# Patient Record
Sex: Male | Born: 1960 | Race: White | Hispanic: No | Marital: Married | Smoking: Former smoker
Health system: Southern US, Community
[De-identification: ages and names within clinical notes are randomized; demographics above are authoritative.]

## PROBLEM LIST (undated history)

## (undated) DIAGNOSIS — R112 Nausea with vomiting, unspecified: Secondary | ICD-10-CM

## (undated) DIAGNOSIS — G5600 Carpal tunnel syndrome, unspecified upper limb: Secondary | ICD-10-CM

## (undated) DIAGNOSIS — Z9889 Other specified postprocedural states: Secondary | ICD-10-CM

## (undated) DIAGNOSIS — G1221 Amyotrophic lateral sclerosis: Secondary | ICD-10-CM

## (undated) DIAGNOSIS — I10 Essential (primary) hypertension: Secondary | ICD-10-CM

## (undated) DIAGNOSIS — F419 Anxiety disorder, unspecified: Secondary | ICD-10-CM

## (undated) DIAGNOSIS — K219 Gastro-esophageal reflux disease without esophagitis: Secondary | ICD-10-CM

## (undated) DIAGNOSIS — E538 Deficiency of other specified B group vitamins: Secondary | ICD-10-CM

## (undated) HISTORY — DX: Essential (primary) hypertension: I10

## (undated) HISTORY — PX: HAND SURGERY: SHX662

## (undated) HISTORY — PX: COLONOSCOPY: SHX174

## (undated) HISTORY — PX: HERNIA REPAIR: SHX51

## (undated) HISTORY — PX: KNEE ARTHROSCOPY: SUR90

---

## 1993-01-16 HISTORY — PX: BACK SURGERY: SHX140

## 2013-07-31 ENCOUNTER — Ambulatory Visit (INDEPENDENT_AMBULATORY_CARE_PROVIDER_SITE_OTHER): Payer: BC Managed Care – PPO

## 2013-07-31 ENCOUNTER — Other Ambulatory Visit: Payer: Self-pay | Admitting: *Deleted

## 2013-07-31 ENCOUNTER — Ambulatory Visit (INDEPENDENT_AMBULATORY_CARE_PROVIDER_SITE_OTHER): Payer: BC Managed Care – PPO | Admitting: Podiatry

## 2013-07-31 ENCOUNTER — Encounter: Payer: Self-pay | Admitting: Podiatry

## 2013-07-31 VITALS — BP 141/91 | HR 69 | Resp 16 | Ht 73.0 in | Wt 280.0 lb

## 2013-07-31 DIAGNOSIS — M722 Plantar fascial fibromatosis: Secondary | ICD-10-CM

## 2013-07-31 DIAGNOSIS — L608 Other nail disorders: Secondary | ICD-10-CM

## 2013-07-31 MED ORDER — METHYLPREDNISOLONE (PAK) 4 MG PO TABS
ORAL_TABLET | ORAL | Status: DC
Start: 2013-07-31 — End: 2013-09-01

## 2013-07-31 MED ORDER — MELOXICAM 15 MG PO TABS: 15.0000 mg | ORAL_TABLET | Freq: Every day | ORAL | Status: DC

## 2013-07-31 NOTE — Patient Instructions (Signed)
Plantar Fasciitis  Plantar fasciitis is a common condition that causes foot pain. It is soreness (inflammation) of the band of tough fibrous tissue on the bottom of the foot that runs from the heel bone (calcaneus) to the ball of the foot. The cause of this soreness may be from excessive standing, poor fitting shoes, running on hard surfaces, being overweight, having an abnormal walk, or overuse (this is common in runners) of the painful foot or feet. It is also common in aerobic exercise dancers and ballet dancers.  SYMPTOMS   Most people with plantar fasciitis complain of:   Severe pain in the morning on the bottom of their foot especially when taking the first steps out of bed. This pain recedes after a few minutes of walking.   Severe pain is experienced also during walking following a long period of inactivity.   Pain is worse when walking barefoot or up stairs  DIAGNOSIS    Your caregiver will diagnose this condition by examining and feeling your foot.   Special tests such as X-rays of your foot, are usually not needed.  PREVENTION    Consult a sports medicine professional before beginning a new exercise program.   Walking programs offer a good workout. With walking there is a lower chance of overuse injuries common to runners. There is less impact and less jarring of the joints.   Begin all new exercise programs slowly. If problems or pain develop, decrease the amount of time or distance until you are at a comfortable level.   Wear good shoes and replace them regularly.   Stretch your foot and the heel cords at the back of the ankle (Achilles tendon) both before and after exercise.   Run or exercise on even surfaces that are not hard. For example, asphalt is better than pavement.   Do not run barefoot on hard surfaces.   If using a treadmill, vary the incline.   Do not continue to workout if you have foot or joint problems. Seek professional help if they do not improve.  HOME CARE INSTRUCTIONS     Avoid activities that cause you pain until you recover.   Use ice or cold packs on the problem or painful areas after working out.   Only take over-the-counter or prescription medicines for pain, discomfort, or fever as directed by your caregiver.   Soft shoe inserts or athletic shoes with air or gel sole cushions may be helpful.   If problems continue or become more severe, consult a sports medicine caregiver or your own health care provider. Cortisone is a potent anti-inflammatory medication that may be injected into the painful area. You can discuss this treatment with your caregiver.  MAKE SURE YOU:    Understand these instructions.   Will watch your condition.   Will get help right away if you are not doing well or get worse.  Document Released: 09/27/2000 Document Revised: 03/27/2011 Document Reviewed: 11/27/2007  ExitCare Patient Information 2015 [employer]. This information is not intended to replace advice given to you by your health care provider. Make sure you discuss any questions you have with your health care provider.

## 2013-07-31 NOTE — Progress Notes (Signed)
   Subjective:    Patient ID: Steve Koch, male    DOB: Sep 23, 1960, 53 y.o.   MRN: [id number]  HPI Comments: Left foot pain , its been on and off for about a year now. Its in the back of my achilles and along the side of the foot and top.  At times it feels like pins in the top of my foot and the back feels like a pull  Foot Pain      Review of Systems  All other systems reviewed and are negative.      Objective:   Physical Exam: I have reviewed his past medical history medications allergies surgeries social history and review of systems. Pulses are strongly palpable bilateral. Neurologic sensorium is intact per Semmes-Weinstein monofilament. Deep tendon reflexes are intact and brisk bilateral. Muscle strength is 5 over 5 dorsiflexors plantar flexors inverters everters all intrinsic musculature appears to be intact. Orthopedic evaluation demonstrates pain on palpation medial continued tubercle of the left heel. Minimal tenderness on palpation of the tendo Achilles. Cutaneous evaluation demonstrates supple well hydrated cutis with exception of the hallux nail plate left which does demonstrate a nail dystrophy with some mild tinea pedis surrounding it. Radiographic evaluation does demonstrate soft tissue increase in density at the plantar fascial calcaneal insertion site of the left heel.        Assessment & Plan:  Assessment: Nail dystrophy hallux left. Plantar fasciitis left foot.  Plan: Discussed etiology pathology conservative versus surgical therapies. Samples of the nail and skin were taken today for culture. We will notify him once this returns. We injected his left heel today with Kenalog and local anesthetic. Dispensed a plantar fascial brace as well as a night splint. He was given both oral and written home-going instructions for the care of his foot we also provided him with prescription for him meloxicam and a Medrol Dosepak. We discussed appropriate shoe gear stretching  exercises ice therapy and shoe gear modifications. I will followup with him in one month

## 2013-08-18 ENCOUNTER — Encounter: Payer: Self-pay | Admitting: Podiatry

## 2013-08-18 ENCOUNTER — Telehealth: Payer: Self-pay | Admitting: *Deleted

## 2013-08-18 NOTE — Telephone Encounter (Signed)
Spoke to patient regarding nail results that was negative for fungus . Discussed nuvail and the possible toenail removal, pt stated that he will discuss that with dr Al Corpushyatt on his next visit

## 2013-09-01 ENCOUNTER — Ambulatory Visit (INDEPENDENT_AMBULATORY_CARE_PROVIDER_SITE_OTHER): Payer: BC Managed Care – PPO | Admitting: Podiatry

## 2013-09-01 ENCOUNTER — Encounter: Payer: Self-pay | Admitting: Podiatry

## 2013-09-01 VITALS — BP 144/85 | HR 69 | Resp 16

## 2013-09-01 DIAGNOSIS — M722 Plantar fascial fibromatosis: Secondary | ICD-10-CM

## 2013-09-01 NOTE — Progress Notes (Signed)
He presents today for followup of plantar fasciitis. States that he is 85-90% resolved left foot.  Objective: Vital signs are stable he is alert and oriented x3. He has no pain on palpation medial continued tubercle of the left heel.  Assessment: Plantar fasciitis resolving.  Plan: I encouraged him to continue the use of his tennis shoes an ultra shoe gear that we discussed last visit. We'll also continue all conservative therapies including night splint today splint icing and anti-inflammatories I will followup with him should he start to regress.

## 2013-09-29 ENCOUNTER — Ambulatory Visit: Payer: BC Managed Care – PPO | Admitting: Podiatry

## 2016-02-28 ENCOUNTER — Ambulatory Visit: Payer: Self-pay | Admitting: Physician Assistant

## 2016-03-02 DIAGNOSIS — F419 Anxiety disorder, unspecified: Secondary | ICD-10-CM | POA: Diagnosis present

## 2016-03-02 DIAGNOSIS — Z79899 Other long term (current) drug therapy: Secondary | ICD-10-CM | POA: Insufficient documentation

## 2016-03-02 DIAGNOSIS — I10 Essential (primary) hypertension: Secondary | ICD-10-CM | POA: Insufficient documentation

## 2016-03-03 ENCOUNTER — Emergency Department (HOSPITAL_COMMUNITY)
Admission: EM | Admit: 2016-03-03 | Discharge: 2016-03-03 | Disposition: A | Discharge status: Home / Self Care | Payer: BLUE CROSS/BLUE SHIELD | Attending: Emergency Medicine | Admitting: Emergency Medicine

## 2016-03-03 ENCOUNTER — Encounter (HOSPITAL_COMMUNITY): Payer: Self-pay

## 2016-03-03 DIAGNOSIS — F410 Panic disorder [episodic paroxysmal anxiety] without agoraphobia: Secondary | ICD-10-CM

## 2016-03-03 MED ORDER — LORAZEPAM 1 MG PO TABS: 1.0000 mg | ORAL_TABLET | Freq: Three times a day (TID) | ORAL | 0 refills | Status: DC | PRN

## 2016-03-03 NOTE — ED Notes (Signed)
Pt states he may have had an anxiety attack tonight due to stress. Pt is going to have surgery next week and has been dealing with workman's comp issues. Pt states he has not been sleeping well due to being uncomfortable. He also states the doctor's are trying to rule out ALS.

## 2016-03-03 NOTE — Discharge Instructions (Signed)
Take the ativan if needed for anxiety. Keep your appointment for your surgery next week.  Recheck if you get any worsening of your weakness, numbness or you seem worse.

## 2016-03-03 NOTE — ED Provider Notes (Signed)
[facility]ENCY DEPT Provider Note   CSN: [id number] Arrival date & time: 03/02/16  2357  Time seen 04:58 AM   History   Chief Complaint Chief Complaint  Patient presents with  . Anxiety    HPI Steve Koch is a 56 y.o. male.  HPI  patient reports he had a fall in October and started having weakness in his right hand over the next 6-8 weeks. He was recently seen by Dr. Shon BatonBrooks, orthopedists at [facility] and was diagnosed with a  cervical 5 myelopathy. Patient denies having any pain in his neck or his extremities. However he has persistent weakness in his right hand. He states initially they thought he might have ALS and that was ruled out. He states he had some difficulty today with his Workmen's Comp. contact and he is worried about the surgery that is going to happen in 6 days. He states he's been having trouble sleeping. Tonight he started getting sweaty and shaky and felt tight all over. He felt like if he threw up he would feel better. When he checked his blood pressure was high at 171/111. He states tonight he took his medications at 9 PM and for the first time he took a Robaxin which he is not sure if that made him feel bad. Wife states the episode lasted about an hour and a half. After he got to the ED and had resolved however it started to start again. Patient feels like he's having a panic attack. Pt is right handed.  PCP Kirk RuthsMCGOUGH,WILLIAM M, MD   Past Medical History:  Diagnosis Date  . Hypertension     There are no active problems to display for this patient.   Past Surgical History:  Procedure Laterality Date  . BACK SURGERY    . HAND SURGERY    . HERNIA REPAIR         Home Medications    Prior to Admission medications   Medication Sig Start Date End Date Taking? Authorizing Provider  amLODipine (NORVASC) 2.5 MG tablet Take 2.5 mg by mouth daily.  02/05/16  Yes Historical Provider, MD  Flaxseed, Linseed, (FLAX SEED OIL) 1000 MG CAPS Take 1,000 mg  by mouth daily.   Yes Historical Provider, MD  methocarbamol (ROBAXIN) 500 MG tablet take 1 tablet by mouth three times a day if needed for muscle spasms 02/11/16  Yes Historical Provider, MD  omeprazole (PRILOSEC) 20 MG capsule Take 20 mg by mouth daily.   Yes Historical Provider, MD  LORazepam (ATIVAN) 1 MG tablet Take 1 tablet (1 mg total) by mouth 3 (three) times daily as needed for anxiety. 03/03/16   Devoria AlbeIva Jake Fuhrmann, MD  meloxicam (MOBIC) 15 MG tablet Take 1 tablet (15 mg total) by mouth daily. Patient not taking: Reported on 03/01/2016 07/31/13   Max T Al CorpusHyatt, DPM    Family History No family history on file.  Social History Social History  Substance Use Topics  . Smoking status: Never Smoker  . Smokeless tobacco: Never Used  . Alcohol use No  employed   Allergies   Patient has no known allergies.   Review of Systems Review of Systems  All other systems reviewed and are negative.    Physical Exam Updated Vital Signs BP 153/94 (BP Location: Left Arm)   Pulse 69   Temp 99.3 F (37.4 C) (Oral)   Resp 16   Ht 6\' 1"  (1.854 m)   Wt 256 lb (116.1 kg)   SpO2 100%   BMI 33.78  kg/m   Vital signs normal    Physical Exam  Constitutional: He appears well-developed and well-nourished. No distress.  HENT:  Head: Normocephalic and atraumatic.  Right Ear: External ear normal.  Left Ear: External ear normal.  Mouth/Throat: Oropharynx is clear and moist.  Eyes: Conjunctivae and EOM are normal. Pupils are equal, round, and reactive to light.  Neck: Normal range of motion. Neck supple.  Cardiovascular: Normal rate, regular rhythm, normal heart sounds and intact distal pulses.   Pulmonary/Chest: Effort normal and breath sounds normal. No respiratory distress.  Musculoskeletal:  Patient is unable to grip my fingers at all with his right hand. He has normal strength on the left. He states this is his baseline. Patient is unable to dorsiflex his right wrist. He is able to have volar  flexion. He appears to have normal range of motion and strength at the level the elbow and the shoulder.     ED Treatments / Results   Procedures Procedures (including critical care time)  Medications Ordered in ED Medications - No data to display   Initial Impression / Assessment and Plan / ED Course  I have reviewed the triage vital signs and the nursing notes.  Pertinent labs & imaging results that were available during my care of the patient were reviewed by me and considered in my medical decision making (see chart for details).  Patient was feeling better at the time of my exam. He states everything is back to his baseline. He was given some alprazolam to use for anxiety. Patient was advised to return if he gets any worsening symptoms.  Final Clinical Impressions(s) / ED Diagnoses   Final diagnoses:  Anxiety attack    New Prescriptions New Prescriptions   LORAZEPAM (ATIVAN) 1 MG TABLET    Take 1 tablet (1 mg total) by mouth 3 (three) times daily as needed for anxiety.    Plan discharge  Devoria Albe, MD, Concha Pyo, MD 03/03/16 [phone]

## 2016-03-03 NOTE — ED Triage Notes (Signed)
I have a bulging disc in my neck, and I am scheduled for surgery next week.  I took my medications before I laid down tonight and I woke up feeling weird all over, could not get comfortable, and I was feeling really stiff.  Patient denies pain.  The doctor's told him if he fell he would probably paralyze him.  Patient states that has been on his mind and could be the problem.  States that they checked his blood pressure at home and it was really high.

## 2016-03-03 NOTE — ED Notes (Signed)
Pt alert & oriented x4, stable gait. Patient given discharge instructions, paperwork & prescription(s). Registration completed in room.  Patient verbalized understanding. Pt left department w/ no further questions. 

## 2016-03-06 ENCOUNTER — Other Ambulatory Visit (HOSPITAL_COMMUNITY): Payer: Self-pay

## 2016-03-06 ENCOUNTER — Other Ambulatory Visit (HOSPITAL_COMMUNITY): Payer: Self-pay | Admitting: *Deleted

## 2016-03-06 NOTE — Pre-Procedure Instructions (Signed)
Andres ShadCharles Vandevelde  03/06/2016    Your procedure is scheduled on Thursday, March 09, 2016 at 2:05 PM.   Report to [facility] Entrance "A" Admitting Office at [employer] Noon.   Call this number if you have problems the morning of surgery: [phone]   Any questions prior to day of surgery, please call [phone] between 8 & 4 PM.   Remember:  Do not eat food or drink liquids after midnight Wednesday, 03/08/16.  Take these medicines the morning of surgery with A SIP OF WATER: Amlodipine (Norvasc), Omeprazole (Prilosec), Lorazepam (Ativan) - if needed  Stop Flaxseed as of today.   Do not wear jewelry.  Do not wear lotions, powders or cologne.  Men may shave face and neck.  Do not bring valuables to the hospital.  [facility] any belongings or valuables.  Contacts, dentures or bridgework may not be worn into surgery.  Leave your suitcase in the car.  After surgery it may be brought to your room.  For patients admitted to the hospital, discharge time will be determined by your treatment team.  Patients discharged the day of surgery will not be allowed to drive home.   Special instructions:  [facility] - Preparing for Surgery  Before surgery, you can play an important role.  Because skin is not sterile, your skin needs to be as free of germs as possible.  You can reduce the number of germs on you skin by washing with CHG (chlorahexidine gluconate) soap before surgery.  CHG is an antiseptic cleaner which kills germs and bonds with the skin to continue killing germs even after washing.  Please DO NOT use if you have an allergy to CHG or antibacterial soaps.  If your skin becomes reddened/irritated stop using the CHG and inform your nurse when you arrive at Short Stay.  Do not shave (including legs and underarms) for at least 48 hours prior to the first CHG shower.  You may shave your face.  Please follow these instructions carefully:   1.  Shower with  CHG Soap the night before surgery and the                    morning of Surgery.  2.  If you choose to wash your hair, wash your hair first as usual with your       normal shampoo.  3.  After you shampoo, rinse your hair and body thoroughly to remove the shampoo.  4.  Use CHG as you would any other liquid soap.  You can apply chg directly       to the skin and wash gently with scrungie or a clean washcloth.  5.  Apply the CHG Soap to your body ONLY FROM THE NECK DOWN.        Do not use on open wounds or open sores.  Avoid contact with your eyes, ears, mouth and genitals (private parts).  Wash genitals (private parts) with your normal soap.  6.  Wash thoroughly, paying special attention to the area where your surgery        will be performed.  7.  Thoroughly rinse your body with warm water from the neck down.  8.  DO NOT shower/wash with your normal soap after using and rinsing off       the CHG Soap.  9.  Pat yourself dry with a clean towel.  10.  Wear clean pajamas.            11.  Place clean sheets on your bed the night of your first shower and do not        sleep with pets.  Day of Surgery  Do not apply any lotions the morning of surgery.  Please wear clean clothes to the hospital.   Please read over the fact sheets that you were given.

## 2016-03-07 ENCOUNTER — Encounter (HOSPITAL_COMMUNITY): Payer: Self-pay

## 2016-03-07 ENCOUNTER — Encounter (HOSPITAL_COMMUNITY)
Admission: RE | Admit: 2016-03-07 | Discharge: 2016-03-07 | Disposition: A | Discharge status: Home / Self Care | Payer: Worker's Compensation | Source: Ambulatory Visit | Attending: Orthopedic Surgery | Admitting: Orthopedic Surgery

## 2016-03-07 DIAGNOSIS — Z01812 Encounter for preprocedural laboratory examination: Secondary | ICD-10-CM | POA: Diagnosis present

## 2016-03-07 DIAGNOSIS — M50222 Other cervical disc displacement at C5-C6 level: Secondary | ICD-10-CM | POA: Diagnosis not present

## 2016-03-07 HISTORY — DX: Other specified postprocedural states: Z98.890

## 2016-03-07 HISTORY — DX: Other specified postprocedural states: R11.2

## 2016-03-07 HISTORY — DX: Gastro-esophageal reflux disease without esophagitis: K21.9

## 2016-03-07 HISTORY — DX: Carpal tunnel syndrome, unspecified upper limb: G56.00

## 2016-03-07 HISTORY — DX: Anxiety disorder, unspecified: F41.9

## 2016-03-07 LAB — SURGICAL PCR SCREEN
MRSA, PCR: NEGATIVE
Staphylococcus aureus: POSITIVE — AB

## 2016-03-07 LAB — BASIC METABOLIC PANEL
Anion gap: 9 (ref 5–15)
BUN: 16 mg/dL (ref 6–20)
CHLORIDE: 107 mmol/L (ref 101–111)
CO2: 24 mmol/L (ref 22–32)
Calcium: 9.5 mg/dL (ref 8.9–10.3)
Creatinine, Ser: 1.07 mg/dL (ref 0.61–1.24)
GFR calc non Af Amer: >60 mL/min (ref >60)
Glucose, Bld: 118 mg/dL — ABNORMAL HIGH (ref 65–99)
POTASSIUM: 4.5 mmol/L (ref 3.5–5.1)
SODIUM: 140 mmol/L (ref 135–145)

## 2016-03-07 LAB — CBC
HEMATOCRIT: 47.3 % (ref 39.0–52.0)
Hemoglobin: 17.1 g/dL — ABNORMAL HIGH (ref 13.0–17.0)
MCH: 30.8 pg (ref 26.0–34.0)
MCHC: 36.2 g/dL — ABNORMAL HIGH (ref 30.0–36.0)
MCV: 85.2 fL (ref 78.0–100.0)
Platelets: 260 10*3/uL (ref 150–400)
RBC: 5.55 MIL/uL (ref 4.22–5.81)
RDW: 13.9 % (ref 11.5–15.5)
WBC: 9.7 10*3/uL (ref 4.0–10.5)

## 2016-03-07 NOTE — Progress Notes (Signed)
   03/07/16 0823  OBSTRUCTIVE SLEEP APNEA  Have you ever been diagnosed with sleep apnea through a sleep study? No  Do you snore loudly (loud enough to be heard through closed doors)?  1  Do you often feel tired, fatigued, or sleepy during the daytime (such as falling asleep during driving or talking to someone)? 1  Has anyone observed you stop breathing during your sleep? 0  Do you have, or are you being treated for high blood pressure? 1  BMI more than 35 kg/m2? 0  Age > 50 (1-yes) 1  Neck circumference greater than:Male 16 inches or larger, Male 17inches or larger? 1  Male Gender (Yes=1) 1  Obstructive Sleep Apnea Score 6  Score 5 or greater  Results sent to PCP

## 2016-03-07 NOTE — Progress Notes (Signed)
Pt denies cardiac history, chest pain or sob. Pt states he had an E[facility] done at [facility] in [place] this month. Have requested that EKG be faxed to us.

## 2016-03-09 ENCOUNTER — Encounter (HOSPITAL_COMMUNITY)
Admission: RE | Disposition: A | Discharge status: Home / Self Care | Payer: Self-pay | Source: Ambulatory Visit | Attending: Orthopedic Surgery

## 2016-03-09 ENCOUNTER — Encounter (HOSPITAL_COMMUNITY): Payer: Self-pay | Admitting: Certified Registered Nurse Anesthetist

## 2016-03-09 ENCOUNTER — Observation Stay (HOSPITAL_COMMUNITY): Payer: Worker's Compensation

## 2016-03-09 ENCOUNTER — Ambulatory Visit (HOSPITAL_COMMUNITY): Payer: Worker's Compensation

## 2016-03-09 ENCOUNTER — Ambulatory Visit (HOSPITAL_COMMUNITY): Payer: Worker's Compensation | Admitting: Certified Registered Nurse Anesthetist

## 2016-03-09 ENCOUNTER — Observation Stay (HOSPITAL_COMMUNITY)
Admission: RE | Admit: 2016-03-09 | Discharge: 2016-03-10 | Disposition: A | Discharge status: Home / Self Care | Payer: Worker's Compensation | Source: Ambulatory Visit | Attending: Orthopedic Surgery | Admitting: Orthopedic Surgery

## 2016-03-09 DIAGNOSIS — F419 Anxiety disorder, unspecified: Secondary | ICD-10-CM | POA: Insufficient documentation

## 2016-03-09 DIAGNOSIS — K219 Gastro-esophageal reflux disease without esophagitis: Secondary | ICD-10-CM | POA: Diagnosis not present

## 2016-03-09 DIAGNOSIS — Z79899 Other long term (current) drug therapy: Secondary | ICD-10-CM | POA: Diagnosis not present

## 2016-03-09 DIAGNOSIS — M4712 Other spondylosis with myelopathy, cervical region: Secondary | ICD-10-CM | POA: Diagnosis not present

## 2016-03-09 DIAGNOSIS — G959 Disease of spinal cord, unspecified: Secondary | ICD-10-CM | POA: Diagnosis present

## 2016-03-09 DIAGNOSIS — I10 Essential (primary) hypertension: Secondary | ICD-10-CM | POA: Diagnosis not present

## 2016-03-09 DIAGNOSIS — Z87891 Personal history of nicotine dependence: Secondary | ICD-10-CM | POA: Diagnosis not present

## 2016-03-09 DIAGNOSIS — Z419 Encounter for procedure for purposes other than remedying health state, unspecified: Secondary | ICD-10-CM

## 2016-03-09 DIAGNOSIS — M50022 Cervical disc disorder at C5-C6 level with myelopathy: Principal | ICD-10-CM | POA: Insufficient documentation

## 2016-03-09 DIAGNOSIS — Z981 Arthrodesis status: Secondary | ICD-10-CM

## 2016-03-09 HISTORY — PX: NECK SURGERY: SHX720

## 2016-03-09 HISTORY — PX: ANTERIOR CERVICAL DECOMP/DISCECTOMY FUSION: SHX1161

## 2016-03-09 SURGERY — ANTERIOR CERVICAL DECOMPRESSION/DISCECTOMY FUSION 2 LEVELS
Anesthesia: General | Site: Spine Cervical

## 2016-03-09 MED ORDER — KETAMINE HCL 10 MG/ML IJ SOLN
INTRAMUSCULAR | Status: DC | PRN
Start: 2016-03-09 — End: 2016-03-09
  Administered 2016-03-09 (×2): 20 mg via INTRAVENOUS

## 2016-03-09 MED ORDER — PROPOFOL 10 MG/ML IV BOLUS
INTRAVENOUS | Status: AC
  Filled 2016-03-09: qty 20

## 2016-03-09 MED ORDER — MORPHINE SULFATE (PF) 2 MG/ML IV SOLN: 2.0000 mg | INTRAVENOUS | Status: DC | PRN

## 2016-03-09 MED ORDER — SCOPOLAMINE 1 MG/3DAYS TD PT72
1.0000 | MEDICATED_PATCH | TRANSDERMAL | Status: DC
  Administered 2016-03-09: 1.5 mg via TRANSDERMAL
  Filled 2016-03-09: qty 1

## 2016-03-09 MED ORDER — METHOCARBAMOL 500 MG PO TABS: 500.0000 mg | ORAL_TABLET | Freq: Three times a day (TID) | ORAL | 0 refills | Status: DC | PRN

## 2016-03-09 MED ORDER — THROMBIN 20000 UNITS EX SOLR
CUTANEOUS | Status: DC | PRN
  Administered 2016-03-09: 20 mL via TOPICAL

## 2016-03-09 MED ORDER — SODIUM CHLORIDE 0.9 % IV SOLN
0.0500 ug/kg/min | INTRAVENOUS | Status: AC
  Administered 2016-03-09: 0.1 ug/kg/min via INTRAVENOUS
  Filled 2016-03-09: qty 5000

## 2016-03-09 MED ORDER — 0.9 % SODIUM CHLORIDE (POUR BTL) OPTIME
TOPICAL | Status: DC | PRN
  Administered 2016-03-09: 1000 mL

## 2016-03-09 MED ORDER — METHOCARBAMOL 500 MG PO TABS: 500.0000 mg | ORAL_TABLET | Freq: Four times a day (QID) | ORAL | Status: DC | PRN

## 2016-03-09 MED ORDER — ONDANSETRON HCL 4 MG/2ML IJ SOLN
INTRAMUSCULAR | Status: AC
  Filled 2016-03-09: qty 2

## 2016-03-09 MED ORDER — OXYCODONE-ACETAMINOPHEN 10-325 MG PO TABS: 1.0000 | ORAL_TABLET | ORAL | 0 refills | Status: DC | PRN

## 2016-03-09 MED ORDER — ACETAMINOPHEN 325 MG PO TABS: 650.0000 mg | ORAL_TABLET | Freq: Four times a day (QID) | ORAL | Status: DC | PRN

## 2016-03-09 MED ORDER — SODIUM CHLORIDE 0.9% FLUSH: 3.0000 mL | Freq: Two times a day (BID) | INTRAVENOUS | Status: DC

## 2016-03-09 MED ORDER — ACETAMINOPHEN 650 MG RE SUPP: 650.0000 mg | RECTAL | Status: DC | PRN

## 2016-03-09 MED ORDER — PHENYLEPHRINE HCL 10 MG/ML IJ SOLN
INTRAVENOUS | Status: DC | PRN
  Administered 2016-03-09: 50 ug/min via INTRAVENOUS

## 2016-03-09 MED ORDER — FENTANYL CITRATE (PF) 100 MCG/2ML IJ SOLN
INTRAMUSCULAR | Status: DC | PRN
  Administered 2016-03-09 (×2): 100 ug via INTRAVENOUS

## 2016-03-09 MED ORDER — ONDANSETRON HCL 4 MG PO TABS: 4.0000 mg | ORAL_TABLET | Freq: Three times a day (TID) | ORAL | 0 refills | Status: DC | PRN

## 2016-03-09 MED ORDER — POLYETHYLENE GLYCOL 3350 17 G PO PACK: 17.0000 g | PACK | Freq: Every day | ORAL | Status: DC | PRN

## 2016-03-09 MED ORDER — THROMBIN 20000 UNITS EX SOLR
CUTANEOUS | Status: AC
  Filled 2016-03-09: qty 20000

## 2016-03-09 MED ORDER — PROPOFOL 500 MG/50ML IV EMUL
INTRAVENOUS | Status: DC | PRN
  Administered 2016-03-09: 15:00:00 via INTRAVENOUS
  Administered 2016-03-09: 50 ug/kg/min via INTRAVENOUS
  Administered 2016-03-09: 13:00:00 via INTRAVENOUS

## 2016-03-09 MED ORDER — MIDAZOLAM HCL 2 MG/2ML IJ SOLN
INTRAMUSCULAR | Status: DC | PRN
  Administered 2016-03-09 (×2): 1 mg via INTRAVENOUS

## 2016-03-09 MED ORDER — ESMOLOL HCL 100 MG/10ML IV SOLN
INTRAVENOUS | Status: DC | PRN
  Administered 2016-03-09: 30 mg via INTRAVENOUS

## 2016-03-09 MED ORDER — LACTATED RINGERS IV SOLN
INTRAVENOUS | Status: DC
  Administered 2016-03-09 (×2): via INTRAVENOUS

## 2016-03-09 MED ORDER — LIDOCAINE 2% (20 MG/ML) 5 ML SYRINGE
INTRAMUSCULAR | Status: AC
  Filled 2016-03-09: qty 5

## 2016-03-09 MED ORDER — ONDANSETRON HCL 4 MG/2ML IJ SOLN: 4.0000 mg | INTRAMUSCULAR | Status: DC | PRN

## 2016-03-09 MED ORDER — ACETAMINOPHEN 325 MG PO TABS: 650.0000 mg | ORAL_TABLET | ORAL | Status: DC | PRN

## 2016-03-09 MED ORDER — OXYCODONE HCL 5 MG PO TABS
10.0000 mg | ORAL_TABLET | ORAL | Status: DC | PRN
  Administered 2016-03-10: 10 mg via ORAL
  Filled 2016-03-09: qty 2

## 2016-03-09 MED ORDER — DEXAMETHASONE SODIUM PHOSPHATE 4 MG/ML IJ SOLN
4.0000 mg | Freq: Four times a day (QID) | INTRAMUSCULAR | Status: DC
  Administered 2016-03-09 – 2016-03-10 (×2): 4 mg via INTRAVENOUS
  Filled 2016-03-09 (×2): qty 1

## 2016-03-09 MED ORDER — FENTANYL CITRATE (PF) 100 MCG/2ML IJ SOLN
INTRAMUSCULAR | Status: AC
  Filled 2016-03-09: qty 2

## 2016-03-09 MED ORDER — PHENOL 1.4 % MT LIQD: 1.0000 | OROMUCOSAL | Status: DC | PRN

## 2016-03-09 MED ORDER — EPHEDRINE SULFATE 50 MG/ML IJ SOLN
INTRAMUSCULAR | Status: DC | PRN
  Administered 2016-03-09 (×2): 10 mg via INTRAVENOUS

## 2016-03-09 MED ORDER — SUCCINYLCHOLINE CHLORIDE 20 MG/ML IJ SOLN
INTRAMUSCULAR | Status: DC | PRN
  Administered 2016-03-09: 100 mg via INTRAVENOUS
  Administered 2016-03-09: 120 mg via INTRAVENOUS

## 2016-03-09 MED ORDER — MENTHOL 3 MG MT LOZG: 1.0000 | LOZENGE | OROMUCOSAL | Status: DC | PRN

## 2016-03-09 MED ORDER — SODIUM CHLORIDE 0.9% FLUSH: 3.0000 mL | INTRAVENOUS | Status: DC | PRN

## 2016-03-09 MED ORDER — DEXAMETHASONE SODIUM PHOSPHATE 10 MG/ML IJ SOLN
INTRAMUSCULAR | Status: AC
  Filled 2016-03-09: qty 1

## 2016-03-09 MED ORDER — ROCURONIUM BROMIDE 50 MG/5ML IV SOSY
PREFILLED_SYRINGE | INTRAVENOUS | Status: AC
  Filled 2016-03-09: qty 5

## 2016-03-09 MED ORDER — GLYCOPYRROLATE 0.2 MG/ML IJ SOLN
INTRAMUSCULAR | Status: DC | PRN
  Administered 2016-03-09 (×3): 0.2 mg via INTRAVENOUS

## 2016-03-09 MED ORDER — LACTATED RINGERS IV SOLN
INTRAVENOUS | Status: DC | PRN
  Administered 2016-03-09 (×2): via INTRAVENOUS

## 2016-03-09 MED ORDER — PROPOFOL 1000 MG/100ML IV EMUL
INTRAVENOUS | Status: AC
  Filled 2016-03-09: qty 100

## 2016-03-09 MED ORDER — LACTATED RINGERS IV SOLN
INTRAVENOUS | Status: DC
  Administered 2016-03-09: 19:00:00 via INTRAVENOUS

## 2016-03-09 MED ORDER — AMLODIPINE BESYLATE 2.5 MG PO TABS
2.5000 mg | ORAL_TABLET | Freq: Every day | ORAL | Status: DC
  Administered 2016-03-10: 2.5 mg via ORAL
  Filled 2016-03-09: qty 1

## 2016-03-09 MED ORDER — BUPIVACAINE-EPINEPHRINE (PF) 0.5% -1:200000 IJ SOLN
INTRAMUSCULAR | Status: AC
  Filled 2016-03-09: qty 30

## 2016-03-09 MED ORDER — ACETAMINOPHEN 650 MG RE SUPP: 650.0000 mg | Freq: Four times a day (QID) | RECTAL | Status: DC | PRN

## 2016-03-09 MED ORDER — CEFAZOLIN SODIUM-DEXTROSE 2-4 GM/100ML-% IV SOLN
2.0000 g | INTRAVENOUS | Status: AC
  Administered 2016-03-09 (×2): 2 g via INTRAVENOUS
  Filled 2016-03-09: qty 100

## 2016-03-09 MED ORDER — PROPOFOL 1000 MG/100ML IV EMUL
INTRAVENOUS | Status: AC
  Filled 2016-03-09: qty 300

## 2016-03-09 MED ORDER — ACETAMINOPHEN 10 MG/ML IV SOLN
INTRAVENOUS | Status: DC | PRN
  Administered 2016-03-09: 1000 mg via INTRAVENOUS

## 2016-03-09 MED ORDER — PROMETHAZINE HCL 25 MG/ML IJ SOLN: 6.2500 mg | INTRAMUSCULAR | Status: DC | PRN

## 2016-03-09 MED ORDER — PANTOPRAZOLE SODIUM 40 MG PO TBEC
40.0000 mg | DELAYED_RELEASE_TABLET | Freq: Every day | ORAL | Status: DC
  Administered 2016-03-10: 40 mg via ORAL
  Filled 2016-03-09: qty 1

## 2016-03-09 MED ORDER — CEFAZOLIN SODIUM-DEXTROSE 2-4 GM/100ML-% IV SOLN
2.0000 g | Freq: Three times a day (TID) | INTRAVENOUS | Status: AC
  Administered 2016-03-10 (×2): 2 g via INTRAVENOUS
  Filled 2016-03-09 (×2): qty 100

## 2016-03-09 MED ORDER — ARTIFICIAL TEARS OP OINT
TOPICAL_OINTMENT | OPHTHALMIC | Status: DC | PRN
  Administered 2016-03-09: 1 via OPHTHALMIC

## 2016-03-09 MED ORDER — PROPOFOL 10 MG/ML IV BOLUS
INTRAVENOUS | Status: DC | PRN
  Administered 2016-03-09: 50 mg via INTRAVENOUS
  Administered 2016-03-09: 200 mg via INTRAVENOUS

## 2016-03-09 MED ORDER — ACETAMINOPHEN 10 MG/ML IV SOLN
1000.0000 mg | Freq: Four times a day (QID) | INTRAVENOUS | Status: DC
  Administered 2016-03-09 – 2016-03-10 (×3): 1000 mg via INTRAVENOUS
  Filled 2016-03-09 (×2): qty 100

## 2016-03-09 MED ORDER — EPHEDRINE 5 MG/ML INJ
INTRAVENOUS | Status: AC
  Filled 2016-03-09: qty 10

## 2016-03-09 MED ORDER — BUPIVACAINE-EPINEPHRINE 0.5% -1:200000 IJ SOLN
INTRAMUSCULAR | Status: DC | PRN
  Administered 2016-03-09: 5 mL

## 2016-03-09 MED ORDER — ARTIFICIAL TEARS OP OINT
TOPICAL_OINTMENT | OPHTHALMIC | Status: AC
  Filled 2016-03-09: qty 3.5

## 2016-03-09 MED ORDER — HYDROMORPHONE HCL 1 MG/ML IJ SOLN: 0.2500 mg | INTRAMUSCULAR | Status: DC | PRN

## 2016-03-09 MED ORDER — DEXAMETHASONE SODIUM PHOSPHATE 10 MG/ML IJ SOLN
INTRAMUSCULAR | Status: DC | PRN
  Administered 2016-03-09: 10 mg via INTRAVENOUS

## 2016-03-09 MED ORDER — DEXAMETHASONE 4 MG PO TABS
4.0000 mg | ORAL_TABLET | Freq: Four times a day (QID) | ORAL | Status: DC
  Administered 2016-03-10: 4 mg via ORAL
  Filled 2016-03-09: qty 1

## 2016-03-09 MED ORDER — LIDOCAINE HCL (CARDIAC) 20 MG/ML IV SOLN
INTRAVENOUS | Status: DC | PRN
  Administered 2016-03-09: 60 mg via INTRATRACHEAL

## 2016-03-09 MED ORDER — ONDANSETRON HCL 4 MG/2ML IJ SOLN
INTRAMUSCULAR | Status: DC | PRN
  Administered 2016-03-09: 4 mg via INTRAVENOUS

## 2016-03-09 MED ORDER — MEPERIDINE HCL 25 MG/ML IJ SOLN: 6.2500 mg | INTRAMUSCULAR | Status: DC | PRN

## 2016-03-09 MED ORDER — METHOCARBAMOL 1000 MG/10ML IJ SOLN
500.0000 mg | Freq: Four times a day (QID) | INTRAMUSCULAR | Status: DC | PRN
  Filled 2016-03-09: qty 5

## 2016-03-09 MED ORDER — SODIUM CHLORIDE 0.9 % IV SOLN: 250.0000 mL | INTRAVENOUS | Status: DC

## 2016-03-09 MED ORDER — MIDAZOLAM HCL 2 MG/2ML IJ SOLN
INTRAMUSCULAR | Status: AC
  Filled 2016-03-09: qty 2

## 2016-03-09 MED ORDER — KETAMINE HCL-SODIUM CHLORIDE 100-0.9 MG/10ML-% IV SOSY
PREFILLED_SYRINGE | INTRAVENOUS | Status: AC
  Filled 2016-03-09: qty 10

## 2016-03-09 SURGICAL SUPPLY — 77 items
BIT DRILL SKYLINE 12MM (BIT) IMPLANT
BLADE SURG ROTATE 9660 (MISCELLANEOUS) IMPLANT
BONE VIVIGEN FORMABLE 1.3CC (Bone Implant) ×3 IMPLANT
BUR EGG ELITE 4.0 (BURR) IMPLANT
BUR EGG ELITE 4.0MM (BURR)
BUR MATCHSTICK NEURO 3.0 LAGG (BURR) IMPLANT
CAGE CERV NANOLOCK LG 7 6D (Spacer) ×2 IMPLANT
CAGE CERV NANOLOCK LG 7MM 6D (Spacer) ×2 IMPLANT
CANISTER SUCTION 2500CC (MISCELLANEOUS) ×3 IMPLANT
CLOSURE STERI-STRIP 1/2X4 (GAUZE/BANDAGES/DRESSINGS) ×1
CLSR STERI-STRIP ANTIMIC 1/2X4 (GAUZE/BANDAGES/DRESSINGS) ×2 IMPLANT
CORDS BIPOLAR (ELECTRODE) ×3 IMPLANT
COVER SURGICAL LIGHT HANDLE (MISCELLANEOUS) ×8 IMPLANT
CRADLE DONUT ADULT HEAD (MISCELLANEOUS) ×3 IMPLANT
DRAIN TLS ROUND 10FR (DRAIN) ×2 IMPLANT
DRAPE C-ARM 42X72 X-RAY (DRAPES) ×3 IMPLANT
DRAPE POUCH INSTRU U-SHP 10X18 (DRAPES) ×3 IMPLANT
DRAPE SURG 17X23 STRL (DRAPES) ×3 IMPLANT
DRAPE U-SHAPE 47X51 STRL (DRAPES) ×3 IMPLANT
DRILL BIT SKYLINE 12MM (BIT) ×3
DRSG MEPILEX BORDER 4X4 (GAUZE/BANDAGES/DRESSINGS) ×3 IMPLANT
DURAPREP 6ML APPLICATOR 50/CS (WOUND CARE) ×3 IMPLANT
ELECT COATED BLADE 2.86 ST (ELECTRODE) ×3 IMPLANT
ELECT PENCIL ROCKER SW 15FT (MISCELLANEOUS) ×3 IMPLANT
ELECT REM PT RETURN 9FT ADLT (ELECTROSURGICAL) ×3
ELECTRODE REM PT RTRN 9FT ADLT (ELECTROSURGICAL) ×1 IMPLANT
FEE INTRAOP MONITOR IMPULS NCS (MISCELLANEOUS) IMPLANT
GLOVE BIO SURGEON STRL SZ 6.5 (GLOVE) ×2 IMPLANT
GLOVE BIO SURGEONS STRL SZ 6.5 (GLOVE) ×1
GLOVE BIOGEL PI IND STRL 6.5 (GLOVE) ×1 IMPLANT
GLOVE BIOGEL PI IND STRL 8.5 (GLOVE) ×1 IMPLANT
GLOVE BIOGEL PI INDICATOR 6.5 (GLOVE) ×2
GLOVE BIOGEL PI INDICATOR 8.5 (GLOVE) ×2
GLOVE SS BIOGEL STRL SZ 8.5 (GLOVE) ×1 IMPLANT
GLOVE SUPERSENSE BIOGEL SZ 8.5 (GLOVE) ×2
GOWN STRL REUS W/ TWL XL LVL3 (GOWN DISPOSABLE) ×2 IMPLANT
GOWN STRL REUS W/TWL 2XL LVL3 (GOWN DISPOSABLE) ×6 IMPLANT
GOWN STRL REUS W/TWL XL LVL3 (GOWN DISPOSABLE) ×6
GRAFT BNE MATRIX VG FRMBL SM 1 (Bone Implant) IMPLANT
INTRAOP MONITOR FEE IMPULS NCS (MISCELLANEOUS) ×1
INTRAOP MONITOR FEE IMPULSE (MISCELLANEOUS) ×2
KIT BASIN OR (CUSTOM PROCEDURE TRAY) ×3 IMPLANT
KIT ROOM TURNOVER OR (KITS) ×3 IMPLANT
NDL SPNL 18GX3.5 QUINCKE PK (NEEDLE) ×1 IMPLANT
NEEDLE SPNL 18GX3.5 QUINCKE PK (NEEDLE) ×3 IMPLANT
NS IRRIG 1000ML POUR BTL (IV SOLUTION) ×3 IMPLANT
PACK ORTHO CERVICAL (CUSTOM PROCEDURE TRAY) ×3 IMPLANT
PACK UNIVERSAL I (CUSTOM PROCEDURE TRAY) ×3 IMPLANT
PAD ARMBOARD 7.5X6 YLW CONV (MISCELLANEOUS) ×6 IMPLANT
PATTIES SURGICAL .25X.25 (GAUZE/BANDAGES/DRESSINGS) ×3 IMPLANT
PIN DISTRACTION 14 (PIN) ×2 IMPLANT
PIN TEMP SKYLINE THREADED (PIN) ×2 IMPLANT
PLATE SKYLINE TWO LEVEL 32MM (Plate) ×2 IMPLANT
PUTTY DBX 1CC (Putty) ×3 IMPLANT
PUTTY DBX 1CC DEPUY (Putty) IMPLANT
RESTRAINT LIMB HOLDER UNIV (RESTRAINTS) ×3 IMPLANT
SCREW SELF DRILL SKYLINE 12MM (Screw) ×4 IMPLANT
SCREW SKYLINE 14MM SD-VA (Screw) ×8 IMPLANT
SLEEVE SURGEON STRL (DRAPES) ×2 IMPLANT
SPONGE INTESTINAL PEANUT (DISPOSABLE) ×3 IMPLANT
SPONGE LAP 4X18 X RAY DECT (DISPOSABLE) ×6 IMPLANT
SPONGE SURGIFOAM ABS GEL 100 (HEMOSTASIS) ×3 IMPLANT
SURGIFLO W/THROMBIN 8M KIT (HEMOSTASIS) IMPLANT
SUT BONE WAX W31G (SUTURE) ×3 IMPLANT
SUT MON AB 3-0 SH 27 (SUTURE) ×3
SUT MON AB 3-0 SH27 (SUTURE) ×1 IMPLANT
SUT SILK 2 0 (SUTURE)
SUT SILK 2-0 18XBRD TIE 12 (SUTURE) IMPLANT
SUT VIC AB 2-0 CT1 18 (SUTURE) ×3 IMPLANT
SYR BULB IRRIGATION 50ML (SYRINGE) ×3 IMPLANT
SYR CONTROL 10ML LL (SYRINGE) ×3 IMPLANT
TAPE CLOTH 4X10 WHT NS (GAUZE/BANDAGES/DRESSINGS) ×3 IMPLANT
TAPE UMBILICAL COTTON 1/8X30 (MISCELLANEOUS) ×3 IMPLANT
TOWEL OR 17X24 6PK STRL BLUE (TOWEL DISPOSABLE) ×3 IMPLANT
TOWEL OR 17X26 10 PK STRL BLUE (TOWEL DISPOSABLE) ×3 IMPLANT
TRAY FOLEY W/METER SILVER 14FR (SET/KITS/TRAYS/PACK) ×2 IMPLANT
WATER STERILE IRR 1000ML POUR (IV SOLUTION) ×3 IMPLANT

## 2016-03-09 NOTE — Op Note (Deleted)
  The note originally documented on this encounter has been moved the the encounter in which it belongs.  

## 2016-03-09 NOTE — Op Note (Signed)
NAME:  Steve Koch, Steve Koch NO.:  [id number]  MEDICAL RECORD NO.:  [id number]  LOCATION:  APA18                        FACILITY:  [facility]  PHYSICIAN:  Tahani Potier D. Shon Baton, M.D. 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  OPERATIVE PROCEDURE:  Anterior cervical diskectomy and fusion, C5-6, C6- 7.  IMPLANT SYSTEM USED:  The nanoLOCK size 7 large intervertebral cage packed with ViviGen with a 32 mm anterior cervical diffuse Skyline plate affixed with 14 mm screws and 16 mm screws.  COMPLICATIONS:  None.  Neuromonitoring performed throughout the case with improvement in motor and sensory evoked potentials at the conclusion of the case.  FIRST ASSISTANT:  Anette Riedel, [place].  HISTORY:  This is a very pleasant 56 year old gentleman, who has had progressive loss in strength and function in the right upper extremity. He has also had an ataxic gait and difficulty with balance and walking. A preoperative MRI demonstrated a very large central and right paracentral disk herniation at C5-6 and a central disk herniation at C6- 7.  The patient had signs and symptoms of significant myelopathy due to this very large disk herniation at C5-6.  After discussing treatment options, we elected to proceed with surgery due to the progressive neurological deficits.  All appropriate risks, benefits, and alternatives were discussed with the patient, and consent was obtained.  OPERATIVE NOTE:  The patient was brought to the operating room and placed supine on the operating table.  After successful induction of general anesthesia and endotracheal intubation, Ted's, SCDs, and a Foley were  inserted and the neuromonitoring rep applied all appropriate needles and pads for intraoperative SSEP and evoked motor potential monitoring.  The anterior cervical spine was prepped and draped in a standard fashion.  Time-out was taken confirming patient, procedure, and all other pertinent important data.  Once this was done, a midline incision was made starting at the midline proceeding to the left, centered over the C6 vertebral body.  Sharp dissection was carried out down to the deep fascia.  I incised the platysma, then began sharply dissecting along the medial border of the sternocleidomastoid performing a standard Smith-Robinson approach to the anterior cervical spine.  The omohyoid was identified and sacrificed for better visualization.  I continued dissecting sharply into the deep cervical and prevertebral fascia until I could palpate the anterior longitudinal ligament.  A self- retaining retractor was placed to mobilize the esophagus and trachea to the right and protected the carotid sheath with the finger on the left. I then used Kittner dissectors to remove the remaining prevertebral and fascia and exposed the entire anterior longitudinal ligament.  I placed the needle into the C5-6 disk space and took a lateral x-ray confirming I was at the appropriate level.  Once this was done, I used bipolar electrocautery to mobilize the longus colli muscle from midbody of C5 to the midbody of C7 bilaterally.  I then removed the large exostoses from the  anterior aspects of the C5-6 and C6-7 disk spaces.  A Caspar retractor was placed underneath the longus colli muscle.  The endotracheal cuff was deflated.  I expanded the retractors to the appropriate width and then reinflated the cuff.  An annulotomy was performed with a 15-blade scalpel at C5-6, and I used pituitary rongeur to remove the bulk of the disk material.  Two 1 mm Kerrison punches were used to remove the overhanging osteophyte  from the inferior aspect of the C5 vertebral body.  Distraction pins were placed into the bodies of 5 and 6.  I distracted the intervertebral space and maintained it with the distraction pins.  I continued to work posteriorly removing the rest of the disk down to the posterior annulus using micro nerve curettes.  I then used my nerve hook to deliver from the central and posterolateral to the right area multiple large fragments of disk material.  I then used my 1 mm Kerrison punch to remove the posterior osteophytes from the bodies of 5 and 6.  I then developed a plane using a micro nerve hook underneath the PLL and then resected the PLL.  This was done starting on the left side and proceeding to the right.  At this point, I was able to develop a plane underneath the still large disk fragment that was remaining.  With a great deal of time, I was able to mobilize this fragment and ultimately remove it in its entirety.  At this point, I felt as though I had a complete diskectomy removal.  I was able to get my nerve hook well behind the bodies of C5 and C6 and swept circumferentially.  I was also able to get underneath the uncovertebral joint and sweep out the nerve toward the nerve root.  This was a very significant defect that was left behind secondary to this large fragment.  At this point, I was pleased with the diskectomy.  I did feel as though based on the amount of disk material I pulled out that it was consistent with the very large disk herniation that was seen on the preoperative MRI.  At this point, I rasped the endplates, measured and placed a size 7 large Titan nanoLOCK cage packed with ViviGen.  At this point, I repositioned my distraction pins from the body of C5 to the body of C7 and then replaced and repositioned my retractors again.  An annulotomy was performed and using the same technique I had used at C5-6, I performed a complete diskectomy.  Again, a nerve hook was  used to develop a plane underneath the PLL and this was resected with a 1 mm Kerrison rongeur and the central disk herniation was excised.  At this point, again the endplates were rasped and the same size implant was used.  Distraction pins were then removed.  All bleeding holes were sealed with bone wax and the wound was irrigated.  A 32 mm anterior cervical plate was affixed using 16 mm self-drilling screws into the body of C5 and 14 mm screws into the bodies of 6 and 7.  All screws had excellent purchase.  The screws were then torqued off, were locked in place according to manufacturer's standards.  I did place a deep drain, which was brought out of a separate stab incision.  At upon the closure, there was no active bleeding.  I did obtain hemostasis.  The platysma was closed with interrupted 2-0 Vicryl suture and the skin was closed with 3-0  Monocryl.  Steri-Strips and dry dressings were applied and the patient was extubated and transferred to the PACU without incident.  It should be noted that after the second nanoLOCK cage was placed, we repeated the evoked motor potentials. Preoperatively, there was no activity in the right upper extremity and at this point, we had restored activity in the right upper extremity. This marked a significant improvement in his spinal cord function from the start of the case.  This positive result persisted.     Vidya Bamford D. Shon BatonBrooks, M.D.     DDB/MEDQ  D:  03/09/2016  T:  03/09/2016  Job:  [id number]

## 2016-03-09 NOTE — Progress Notes (Signed)
Orthopedic Tech Progress Note Patient Details:  Steve Koch 05/14/1960 [id number] Patient has brace Patient ID: Steve Koch, male   DOB: 05/14/1960, 56 y.o.   MRN: [id number]   Steve Koch, Steve Koch 03/09/2016, 7:21 PM

## 2016-03-09 NOTE — Brief Op Note (Signed)
03/09/2016  5:39 PM  PATIENT:  Steve Koch  56 y.o. 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, C6-7 Herniated nucleus pulposus  PROCEDURE:  Procedure(s) with comments: Anterior Cervical Discectomy Fusion Cervial 5-7 (N/A) - Requests for 3.5 hrs  SURGEON:  Surgeon(s) and Role:    * Venita Lickahari Ayana Imhof, MD - Primary  PHYSICIAN ASSISTANT:   ASSISTANTS: Carmen Mayo   ANESTHESIA:   general  EBL:  Total I/O In: 2000 [I.V.:2000] Out: 1725 [Urine:1575; Blood:150]  BLOOD ADMINISTERED:none  DRAINS: 1 drain in the neck   LOCAL MEDICATIONS USED:  MARCAINE     SPECIMEN:  No Specimen  DISPOSITION OF SPECIMEN:  N/A  COUNTS:  YES  TOURNIQUET:  * No tourniquets in log *  DICTATION: .Other Dictation: Dictation Number [id number]  PLAN OF CARE: Admit for overnight observation  PATIENT DISPOSITION:  PACU - hemodynamically stable.

## 2016-03-09 NOTE — Anesthesia Preprocedure Evaluation (Addendum)
Anesthesia Evaluation  Patient identified by MRN, date of birth, ID band Patient awake    Reviewed: Allergy & Precautions, NPO status , Patient's Chart, lab work & pertinent test results  History of Anesthesia Complications (+) PONV and history of anesthetic complications  Airway Mallampati: I       Dental no notable dental hx. (+) Caps, Dental Advisory Given, Teeth Intact,    Pulmonary former smoker,    Pulmonary exam normal breath sounds clear to auscultation       Cardiovascular hypertension, Pt. on medications Normal cardiovascular exam Rhythm:Regular Rate:Normal     Neuro/Psych Anxiety  Neuromuscular disease    GI/Hepatic Neg liver ROS, GERD  Medicated,  Endo/Other    Renal/GU negative Renal ROS  negative genitourinary   Musculoskeletal C5-6, C6-7 herniated discs with myelopathy   Abdominal (+) + obese,   Peds  Hematology negative hematology ROS (+)   Anesthesia Other Findings   Reproductive/Obstetrics                          Anesthesia Physical Anesthesia Plan  ASA: II  Anesthesia Plan: General   Post-op Pain Management:    Induction: Intravenous  Airway Management Planned: Oral ETT  Additional Equipment:   Intra-op Plan:   Post-operative Plan: Extubation in OR  Informed Consent: I have reviewed the patients History and Physical, chart, labs and discussed the procedure including the risks, benefits and alternatives for the proposed anesthesia with the patient or authorized representative who has indicated his/her understanding and acceptance.   Dental advisory given  Plan Discussed with: Anesthesiologist, CRNA and Surgeon  Anesthesia Plan Comments:        Anesthesia Quick Evaluation

## 2016-03-09 NOTE — Anesthesia Procedure Notes (Signed)
Procedure Name: Intubation Date/Time: 03/09/2016 1:44 PM Performed by: Leilani AbleHATCHETT, FRANKLIN Pre-anesthesia Checklist: Patient identified, Emergency Drugs available, Suction available and Patient being monitored Patient Re-evaluated:Patient Re-evaluated prior to inductionOxygen Delivery Method: Circle system utilized Preoxygenation: Pre-oxygenation with 100% oxygen Intubation Type: IV induction Ventilation: Mask ventilation without difficulty Laryngoscope Size: Glidescope (T3) Grade View: Grade II Tube type: Oral Number of attempts: 1 Airway Equipment and Method: Video-laryngoscopy Placement Confirmation: ETT inserted through vocal cords under direct vision,  positive ETCO2 and breath sounds checked- equal and bilateral Secured at: 23 cm Tube secured with: Tape Dental Injury: Teeth and Oropharynx as per pre-operative assessment

## 2016-03-09 NOTE — Transfer of Care (Signed)
Immediate Anesthesia Transfer of Care Note  Patient: Steve Koch  Procedure(s) Performed: Procedure(s) with comments: Anterior Cervical Discectomy Fusion Cervial 5-7 (N/A) - Requests for 3.5 hrs  Patient Location: PACU  Anesthesia Type:General  Level of Consciousness: awake and patient cooperative  Airway & Oxygen Therapy: Patient Spontanous Breathing and Patient connected to face mask oxygen  Post-op Assessment: Report given to RN and Post -op Vital signs reviewed and stable  Post vital signs: Reviewed and stable  Last Vitals:  Vitals:   03/09/16 0934  BP: (!) 160/92  Pulse: 71  Resp: 18  Temp: 36.8 C    Last Pain:  Vitals:   03/09/16 0934  TempSrc: Oral      Patients Stated Pain Goal: 3 (03/09/16 1001)  Complications: No apparent anesthesia complications

## 2016-03-09 NOTE — H&P (Signed)
History of Present Illness The patient is a 56 year old male who comes in today for a preoperative History and Physical. The patient is scheduled for a ACDF C5-7 to be performed by Dr. Debria Garretahari D. Shon BatonBrooks, [facility] 03/09/16 . Please see the hospital record for complete dictated history and physical. the pt reports a hx of good health.  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 Diabetes Mellitus  Father, Paternal Grandmother. Hypertension  Brother, Father.  Social History  Tobacco use  Former smoker. 02/08/2016 Children  3 Current drinker  02/08/2016: Currently drinks beer only occasionally per week Current work status  working full time Exercise  Exercises rarely; does other Living situation  live with spouse Marital status  married No history of drug/alcohol rehab  Not under pain contract  Number of flights of stairs before winded  4-5  Medication History AmLODIPine Besylate (2.5MG  Tablet, Oral) Active. Medications Reconciled  Vitals 03/06/2016 8:08 AM Weight: 253 lb Height: 73in Body Surface Area: 2.38 m Body Mass Index: 33.38 kg/m  Temp.: 98.8F   General General Appearance-Not in acute distress. Orientation-Oriented X3. Build & Nutrition-Well nourished and Well developed.  Integumentary General Characteristics Surgical Scars - no surgical scar evidence of previous cervical surgery. Cervical Spine-Skin examination of the cervical spine is without  deformity, skin lesions, lacerations or abrasions.  Chest and Lung Exam Auscultation Breath sounds - Normal and Clear.  Cardiovascular Auscultation Rhythm - Regular rate and rhythm.  Peripheral Vascular Upper Extremity Palpation - Radial pulse - Bilateral - 2+.  Neurologic Sensation Upper Extremity - Left - sensation is intact in the upper extremity. Right - sensation is diminished in the upper extremity. Reflexes Biceps Reflex - Bilateral - 2+. Brachioradialis Reflex - Bilateral - 2+. Triceps Reflex - Bilateral - 2+. Hoffman's Sign - Bilateral - Hoffman's sign present. Note: positive babinski   Musculoskeletal Spine/Ribs/Pelvis  Cervical Spine : Inspection and Palpation - Tenderness - no soft tissue tenderness to palpation and no bony tenderness to palpation, bony/soft tissue palpation of the cervical spine and shoulders does not recreate their typical pain. Strength and Tone: Strength: Strength: Strength - Hand Grip - Bilateral - 5/5. Right - 3+/5. Deltoid - Left - 5/5. Biceps - Left - 5/5. Right - 3/5. Triceps - Left - 5/5. Right - 3/5. Wrist Extension - Left - 5/5. Right - 3/5. Heel-Toe Walk - Bilateral - unable to heel-toe walk. ROM - Flexion - Moderately Decreased and painful. Extension - Moderately Decreased and painful. Left Lateral Flexion - Moderately Decreased and painful. Right Lateral Flexion - Moderately Decreased and painful. Left Rotation - Moderately Decreased and painful. Right Rotation - Moderately Decreased and painful. Pain - neither flexion or extension is more painful than the other. Cervical Spine - Special Testing - axial compression test negative, cross chest impingement test negative. Non-Anatomic Signs - No non-anatomic signs present. Upper Extremity Range of Motion - No truesholder pain with IR/ER of the shoulders.  MRI, he has a large C5-6 disc herniation that is causing significant mass effect on the spinal canal. We believe that is the primary source of  his pain, right sided C5-6 and C7 radicular symptoms that we are seeing as well as myelopathic symptoms. He does have another disc going to the left at C6-7, which is also causing some of the pain and dysesthesias into his left upper extremity. X-rays also reveal degenerative changes.  Assessment & Plan  Anterior cervical fusion:Risks of surgery include, but are not limited to: Throat pain, swallowing difficulty, hoarseness or change in voice, death, stroke, paralysis, nerve root damage/injury, bleeding, blood clots, loss of bowel/bladder control, hardware failure, or mal-position, spinal fluid leak, adjacent segment disease, non-union, need for further surgery, ongoing or worse pain, infection. Post-operative bleeding or swelling that could require emergent surgery.  Goal Of Surgery: Discussed that goal of surgery is to reduce pain and improve function and quality of life. Patient is aware that despite all appropriate treatment that there pain and function could be the same, worse, or different.  At this point in time, the patient has cervical spondylitic myeloradiculopathy. Given the progression of his symptoms and the duration of his symptoms, I do think surgical intervention in a somewhat urgent fashion is recommended. We have reviewed the risks of an ACDF. I have addressed all of their questions. We will plan on moving forward with a two-level ACDF in the very near future. It is clear that as a result of the 10/19/2015 work-related injury, which was a fall from a three to four foot high elevated dock that there was significant torsion injury to the upper extremity and neck which resulted in an acute large disc herniation. Prior to this, there was no clinical evidence to suggest that he had myelopathy and therefore, there is no evidence that supports that this was a preexisting problem. The patient has already had a nerve conduction test, which ruled out a plexus injury. 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 We will plan on moving forward that as soon as possible. Once he recovers from that, then I will have him followup with one of my hand upper extremity guys to determine whether or not the peripheral nerve entrapment will need to be addressed. My hope is that with appropriate treatment that eventually he will be able to return to gainful full time employment; however, I did tell him that the primary goal of this surgery is to prevent further deterioration in his neurological exam, not necessarily to get improvement.

## 2016-03-10 DIAGNOSIS — M50022 Cervical disc disorder at C5-C6 level with myelopathy: Secondary | ICD-10-CM | POA: Diagnosis not present

## 2016-03-10 NOTE — Discharge Instructions (Signed)

## 2016-03-10 NOTE — Progress Notes (Signed)
Occupational Therapy Treatment Patient Details Name: Olumide Dolinger MRN: [id number] DOB: [birth year] Today's Date: 03/10/2016    History of present illness Pt is a 56 y/o male who presents s/p C5-C7 ACDF on 03/09/16.   OT comments  This 56 yo male seen for a second time today to follow up with any questions about exercises/activities he is to do based of handout I gave him. All questions answered, follow up therapy per MD. Acute OT will sign off.   Follow Up Recommendations  Other (comment) (follow up per MD)    Equipment Recommendations  None recommended by OT       Precautions / Restrictions Precautions Precautions: Fall;Cervical Precaution Comments: Reviewed handout in detail, and pt was cued for precautions during functional mobility.  Required Braces or Orthoses: Cervical Brace Cervical Brace: Hard collar (off in bed, off for eating, off for showering) Restrictions Weight Bearing Restrictions: No                        Cognition   Behavior During Therapy: WFL for tasks assessed/performed Overall Cognitive Status: Within Functional Limits for tasks assessed                         Exercises Other Exercises: Pt and wife had some questions about exercises/activies. I went over the ones again for his elbow, forearm, and wrist with wife to A prn for stretching of forearm and wrist extension. I also issued pt a wrist support and red foam tubing for pt to try and self feed with RUE now--with instruction to only use wrist support when eating otherwise I want him to try and use/move his wrist.  Other Exercises: Educated wife and patient on changing out pads and care of pads for cerival collar           Pertinent Vitals/ Pain       Pain Assessment: 0-10 Pain Score: 4  Pain Location: Incision site Pain Descriptors / Indicators: Operative site guarding;Discomfort Pain Intervention(s): Limited activity within patient's tolerance;Monitored during  session;Repositioned  Home Living Family/patient expects to be discharged to:: Private residence Living Arrangements: Spouse/significant other Available Help at Discharge: Family;Available 24 hours/day Type of Home: House Home Access: Stairs to enter [employer] of Steps: 4 Entrance Stairs-Rails: None Home Layout: One level     Bathroom Shower/Tub: Producer, television/film/video: Standard Bathroom Accessibility: Yes   Home Equipment: Shower seat - built in;Walker - 4 wheels          Prior Functioning/Environment Level of Independence: Independent;Needs assistance    ADL's / Homemaking Assistance Needed: sometimes A from wife for LBD   Comments: Occasional assist from wife for stairs   Frequency  Min 2X/week        Progress Toward Goals  OT Goals(current goals can now be found in the care plan section)  Progress towards OT goals: Progressing toward goals  Acute Rehab OT Goals Patient Stated Goal: Home this afternoon OT Goal Formulation: With patient/family Time For Goal Achievement: 03/17/16 Potential to Achieve Goals: Good ADL Goals Pt/caregiver will Perform Home Exercise Program: Increased ROM;Increased strength;Both right and left upper extremity;With theraputty;With written HEP provided (prn A)  Plan         End of Session Equipment Utilized During Treatment: Cervical collar  OT Visit Diagnosis: Muscle weakness (generalized) (M62.81);Hemiplegia and hemiparesis Hemiplegia - Right/Left: Right Hemiplegia - dominant/non-dominant: Dominant Hemiplegia - caused by: Unspecified (cervical)  Activity Tolerance Patient tolerated treatment well   Patient Left in chair;with call bell/phone within reach;with family/visitor present   Nurse Communication  (pt need extra pads for collar)    Functional Assessment Tool Used: AM-PAC 6 Clicks Daily Activity Functional Limitation: Self care Self Care Current Status (N8295(G8987): At least 40 percent but less  than 60 percent impaired, limited or restricted Self Care Goal Status (A2130(G8988): At least 40 percent but less than 60 percent impaired, limited or restricted   Time: 1023-1043 OT Time Calculation (min): 20 min  Charges: OT G-codes **NOT FOR INPATIENT CLASS** Functional Assessment Tool Used: AM-PAC 6 Clicks Daily Activity Functional Limitation: Self care Self Care Current Status (Q6578(G8987): At least 40 percent but less than 60 percent impaired, limited or restricted Self Care Goal Status (I6962(G8988): At least 40 percent but less than 60 percent impaired, limited or restricted OT General Charges $OT Visit: 1 Procedure OT Evaluation $OT Eval Moderate Complexity: 1 Procedure OT Treatments $Self Care/Home Management : 8-22 mins $Therapeutic Exercise: 8-22 mins  Ignacia PalmaCathy Shirla Hodgkiss, OTR/L [phone] 03/10/2016

## 2016-03-10 NOTE — Progress Notes (Signed)
    Subjective: Procedure(s) (LRB): Anterior Cervical Discectomy Fusion Cervial 5-7 (N/A) 1 Day Post-Op  Patient reports pain as 2 on 0-10 scale.  Reports decreased arm pain reports incisional neck pain   Positive void Negative bowel movement Positive flatus Negative chest pain or shortness of breath  Objective: Vital signs in last 24 hours: Temp:  [98.2 F (36.8 C)-99 F (37.2 C)] 98.6 F (37 C) (02/23 0400) Pulse Rate:  [65-119] 65 (02/23 0400) Resp:  [17-20] 20 (02/23 0400) BP: (102-160)/(53-92) 132/81 (02/23 0400) SpO2:  [93 %-100 %] 95 % (02/23 0400) Weight:  [116.1 kg (256 lb)] 116.1 kg (256 lb) (02/22 0934)  Intake/Output from previous day: 02/22 0701 - 02/23 0700 In: 3128.8 [I.V.:2828.8; IV Piggyback:300] Out: 3575 [Urine:3425; Blood:150]  Labs:  Recent Labs  03/07/16 0840  WBC 9.7  RBC 5.55  HCT 47.3  PLT 260    Recent Labs  03/07/16 0840  NA 140  K 4.5  CL 107  CO2 24  BUN 16  CREATININE 1.07  GLUCOSE 118*  CALCIUM 9.5   No results for input(s): LABPT, INR in the last 72 hours.  Physical Exam: ABD soft Intact pulses distally Incision: dressing C/D/I Compartment soft [place] drainage from cervical spine Neuro: gait still unsteady but patient reports improvement Right UE: remains globally weak (C5-8) - but patient reports improvement from baseline   Assessment/Plan: Patient stable  xrays satisfactory Mobilization with physical therapy Encourage incentive spirometry Continue care  Advance diet Up with therapy  OT to see patient today for UE weakness and atrophy Patient doing well.  As I told him prior to surgery given significance and duration of compression his recover will be lengthy. However I am encouraged by the subjective improvement  No acute post-op issues Plan on d/c to home with OT exercises and f/u in 2 weeks  Venita Lick, MD [facility] [phone]

## 2016-03-10 NOTE — Progress Notes (Signed)
Patient is discharged from room 3C07 at this time. Alert and in stable condition. IV site d/c'd and instructions read to patient and wife with understanding verbalized. Left unit via wheelchair with all belongings at side. 

## 2016-03-10 NOTE — Anesthesia Postprocedure Evaluation (Signed)
Anesthesia Post Note  Patient: Steve ShadCharles Koch  Procedure(s) Performed: Procedure(s) (LRB): Anterior Cervical Discectomy Fusion Cervial 5-7 (N/A)  Patient location during evaluation: PACU Anesthesia Type: General Level of consciousness: awake Respiratory status: spontaneous breathing Cardiovascular status: stable Anesthetic complications: no       Last Vitals:  Vitals:   03/10/16 0006 03/10/16 0400  BP: 125/78 132/81  Pulse: 68 65  Resp: 20 20  Temp: 36.9 C 37 C    Last Pain:  Vitals:   03/10/16 0854  TempSrc:   PainSc: 4                  Caia Lofaro

## 2016-03-10 NOTE — Evaluation (Signed)
Occupational Therapy Evaluation Patient Details Name: Steve Koch MRN: [id number] DOB: 12-18-1960 Today's Date: 03/10/2016    History of Present Illness Pt is a 56 y/o male who presents s/p C5-C7 ACDF on 03/09/16.   Clinical Impression   This 56 yo male admitted and underwent above presents to acute OT with deficits below (see OT problem list) thus affecting his use of Bil UEs and balance. He will benefit from acute OT with follow up therapy per MD.     Follow Up Recommendations  Other (comment) (follow up per MD)    Equipment Recommendations  None recommended by OT       Precautions / Restrictions Precautions Precautions: Fall;Cervical Precaution Comments: Reviewed handout in detail, and pt was cued for precautions during functional mobility.  Required Braces or Orthoses: Cervical Brace Cervical Brace: Hard collar (off for sleeping and can have off or lower for eating) Restrictions Weight Bearing Restrictions: No      Mobility Bed Mobility Overal bed mobility: Needs Assistance       General bed mobility comments: Pt up in recliner upon my arrival  Transfers Overall transfer level: Needs assistance Equipment used: Straight cane Transfers: Sit to/from Stand Sit to Stand: Min guard            Balance Overall balance assessment: Needs assistance Sitting-balance support: Feet supported;No upper extremity supported Sitting balance-Leahy Scale: Fair     Standing balance support: No upper extremity supported;During functional activity Standing balance-Leahy Scale: Poor                              ADL Overall ADL's : Needs assistance/impaired Eating/Feeding: Set up Eating/Feeding Details: uses LUE to eat due to decreased use of RUE and wrist drop Grooming: Modified independent;Sitting;Standing   Upper Body Bathing: Minimal assistance;Sitting Upper Body Bathing Details (indicate cue type and reason): due to having to be more careful due to  collar will be off for showering Lower Body Bathing: Moderate assistance Lower Body Bathing Details (indicate cue type and reason): mingaurd A sit<>stand Upper Body Dressing : Maximal assistance;Sitting;Standing   Lower Body Dressing: Maximal assistance (minguard A sit<>stand; worked with pt trying to turn sideways on bed to don LB clothing--issue with slickness of bed sheets and ted hose combination--recommended pt try at home on his bed that does not have slick sheets)   Toilet Transfer: Min guard;Ambulation         Tub/Shower Transfer Details (indicate cue type and reason): Pt has a walk in shower with built in seat, recommended that he sit to shower and wife states she will look into getting a hand held shower head         Vision Patient Visual Report: No change from baseline              Pertinent Vitals/Pain Pain Assessment: 0-10 Pain Score: 4  Pain Location: Incision site Pain Descriptors / Indicators: Operative site guarding;Discomfort Pain Intervention(s): Monitored during session;Repositioned;Limited activity within patient's tolerance     Hand Dominance Right   Extremity/Trunk Assessment Upper Extremity Assessment Upper Extremity Assessment: RUE deficits/detail RUE Deficits / Details: Decreased AROM throughout--PROM WNL mainly except due to some edema in arm RUE Coordination: decreased fine motor;decreased gross motor     Communication Communication Communication: No difficulties   Cognition Arousal/Alertness: Awake/alert Behavior During Therapy: WFL for tasks assessed/performed Overall Cognitive Status: Within Functional Limits for tasks assessed  Exercises   Other Exercises Other Exercises: Gave pt putty (all 3 levels to help him to hopefully progress until he can go to OP therapy once MD releases him to do so). Went over and wrote down theraputty exercises, as well as resistive and non resisitive exercises for elbow  distally, AROM no reistance for shoulder, and FM acitivty sheet.   Shoulder Instructions      Home Living Family/patient expects to be discharged to:: Private residence Living Arrangements: Spouse/significant other Available Help at Discharge: Family;Available 24 hours/day Type of Home: House Home Access: Stairs to enter [employer] of Steps: 4 Entrance Stairs-Rails: None Home Layout: One level     Bathroom Shower/Tub: Producer, television/film/video: Standard Bathroom Accessibility: Yes   Home Equipment: Shower seat - built in;Walker - 4 wheels          Prior Functioning/Environment Level of Independence: Independent;Needs assistance    ADL's / Homemaking Assistance Needed: sometimes A from wife for LBD   Comments: Occasional assist from wife for stairs        OT Problem List: Decreased strength;Decreased range of motion;Impaired balance (sitting and/or standing);Impaired tone;Impaired UE functional use;Pain      OT Treatment/Interventions: Patient/family education;Therapeutic activities;Therapeutic exercise    OT Goals(Current goals can be found in the care plan section) Acute Rehab OT Goals Patient Stated Goal: Home this afternoon OT Goal Formulation: With patient/family Time For Goal Achievement: 03/17/16 Potential to Achieve Goals: Good  OT Frequency: Min 2X/week              End of Session Equipment Utilized During Treatment: Cervical collar Nurse Communication:  (pt need extra pads for collar)  Activity Tolerance: Patient tolerated treatment well Patient left: in chair;with call bell/phone within reach;with family/visitor present  OT Visit Diagnosis: Muscle weakness (generalized) (M62.81);Hemiplegia and hemiparesis Hemiplegia - Right/Left: Right Hemiplegia - dominant/non-dominant: Dominant Hemiplegia - caused by: Unspecified (cervical )                ADL either performed or assessed with clinical judgement  Time: 0818-0913 OT Time  Calculation (min): 55 min Charges:  OT General Charges $OT Visit: 1 Procedure OT Evaluation $OT Eval Moderate Complexity: 1 Procedure OT Treatments $Self Care/Home Management : 8-22 mins $Therapeutic Exercise: 23-37 mins G-Codes: OT G-codes **NOT FOR INPATIENT CLASS** Functional Assessment Tool Used: AM-PAC 6 Clicks Daily Activity Functional Limitation: Self care Self Care Current Status (U9811): At least 40 percent but less than 60 percent impaired, limited or restricted Self Care Goal Status (B1478): At least 40 percent but less than 60 percent impaired, limited or restricted   Ignacia Palma, OTR/L [phone] 03/10/2016

## 2016-03-10 NOTE — Evaluation (Signed)
Physical Therapy Evaluation Patient Details Name: Steve Koch MRN: [id number] DOB: 02-17-1960 Today's Date: 03/10/2016   History of Present Illness  Pt is a 56 y/o male who presents s/p C5-C7 ACDF on 03/09/16.  Clinical Impression  Pt admitted with above diagnosis. Pt currently with functional limitations due to the deficits listed below (see PT Problem List). At the time of PT eval pt was able to perform transfers and ambulation with min assist for balance support and safety. Pt was educated on safety with stair negotiation and recommended wife use gait belt for fall prevention. Pt anticipates d/c home this afternoon but will keep on PT caseload and continue to follow. Pt will benefit from skilled PT to increase their independence and safety with mobility to allow discharge to the venue listed below.       Follow Up Recommendations Outpatient PT;Supervision for mobility/OOB    Equipment Recommendations  Cane    Recommendations for Other Services       Precautions / Restrictions Precautions Precautions: Fall;Cervical Precaution Comments: Reviewed handout in detail, and pt was cued for precautions during functional mobility.  Required Braces or Orthoses: Cervical Brace Cervical Brace: Hard collar;At all times Restrictions Weight Bearing Restrictions: No      Mobility  Bed Mobility Overal bed mobility: Needs Assistance Bed Mobility: Rolling;Sidelying to Sit Rolling: Supervision Sidelying to sit: Supervision       General bed mobility comments: No assist required. Pt was able to transition to EOB with increased time and cues for log roll. HOB slightly elevated.   Transfers Overall transfer level: Needs assistance Equipment used: Straight cane Transfers: Sit to/from Stand Sit to Stand: Min assist         General transfer comment: Assist initially required as pt stood to avoid posterior LOB back onto the bed. At end of session, pt stood again from EOB and did not  require assist.   Ambulation/Gait Ambulation/Gait assistance: Min assist Ambulation Distance (Feet): 200 Feet Assistive device: Straight cane Gait Pattern/deviations: Step-through pattern;Decreased stride length;Trunk flexed Gait velocity: Decreased Gait velocity interpretation: Below normal speed for age/gender General Gait Details: Occasional min assist provided for balance deficits. Pt reports feeling more secure with SPC use.   Stairs Stairs: Yes Stairs assistance: Min assist;Mod assist;+2 safety/equipment Stair Management: Forwards;With cane Number of Stairs: 2 (x4 practice trials) General stair comments: VC's for sequencing and general safety. Pt with no rails at home and had difficulty initially using cane to advance up to next step. He tried with rail initially and transitioned to the cane at end of stair training.   Wheelchair Mobility    Modified Rankin (Stroke Patients Only)       Balance Overall balance assessment: Needs assistance Sitting-balance support: Feet supported;No upper extremity supported Sitting balance-Leahy Scale: Fair     Standing balance support: No upper extremity supported;During functional activity Standing balance-Leahy Scale: Poor                               Pertinent Vitals/Pain Pain Assessment: 0-10 Pain Score: 7  Pain Location: Incision site Pain Descriptors / Indicators: Operative site guarding;Discomfort Pain Intervention(s): Limited activity within patient's tolerance;Monitored during session;Repositioned    Home Living Family/patient expects to be discharged to:: Private residence Living Arrangements: Spouse/significant other Available Help at Discharge: Family;Available 24 hours/day Type of Home: House Home Access: Stairs to enter Entrance Stairs-Rails: None Entrance Stairs-Number of Steps: 4 Home Layout: One level Home Equipment: Shower  seat - built in;Walker - 4 wheels      Prior Function Level of  Independence: Independent         Comments: Occasional assist from wife for stairs     Hand Dominance   Dominant Hand: Right    Extremity/Trunk Assessment   Upper Extremity Assessment Upper Extremity Assessment: RUE deficits/detail RUE Deficits / Details: Decreased grip and AROM of wrist/elbow. See OT note for more detailed assessment.    Lower Extremity Assessment Lower Extremity Assessment: Generalized weakness;RLE deficits/detail;LLE deficits/detail RLE Deficits / Details: Decreased strength bilaterally consistent with pre-op diagnosis.     Cervical / Trunk Assessment Cervical / Trunk Assessment: Other exceptions Cervical / Trunk Exceptions: s/p cervical surgery   Communication   Communication: No difficulties  Cognition Arousal/Alertness: Awake/alert Behavior During Therapy: WFL for tasks assessed/performed Overall Cognitive Status: Within Functional Limits for tasks assessed                      General Comments      Exercises     Assessment/Plan    PT Assessment Patient needs continued PT services  PT Problem List Decreased strength;Decreased range of motion;Decreased activity tolerance;Decreased balance;Decreased mobility;Decreased knowledge of use of DME;Decreased safety awareness;Decreased knowledge of precautions;Pain       PT Treatment Interventions DME instruction;Gait training;Stair training;Functional mobility training;Therapeutic activities;Therapeutic exercise;Neuromuscular re-education;Patient/family education    PT Goals (Current goals can be found in the Care Plan section)  Acute Rehab PT Goals Patient Stated Goal: Home this afternoon PT Goal Formulation: With patient Time For Goal Achievement: 03/17/16 Potential to Achieve Goals: Good    Frequency Min 5X/week   Barriers to discharge        Co-evaluation               End of Session Equipment Utilized During Treatment: Gait belt;Cervical collar Activity Tolerance:  Patient tolerated treatment well Patient left: in chair;with call bell/phone within reach (With OT present) Nurse Communication: Mobility status PT Visit Diagnosis: Unsteadiness on feet (R26.81);Other abnormalities of gait and mobility (R26.89);Pain Pain - Right/Left: Right Pain - part of body: Arm;Hand (Neck)    Functional Assessment Tool Used: Clinical judgement Functional Limitation: Mobility: Walking and moving around Mobility: Walking and Moving Around Current Status ([id number](G8978): At least 40 percent but less than 60 percent impaired, limited or restricted Mobility: Walking and Moving Around Goal Status [phone](G8979): At least 20 percent but less than 40 percent impaired, limited or restricted    Time: 0746-0823 PT Time Calculation (min) (ACUTE ONLY): 37 min   Charges:   PT Evaluation $PT Eval Moderate Complexity: 1 Procedure PT Treatments $Gait Training: 8-22 mins   PT G Codes:   PT G-Codes **NOT FOR INPATIENT CLASS** Functional Assessment Tool Used: Clinical judgement Functional Limitation: Mobility: Walking and moving around Mobility: Walking and Moving Around Current Status (U9811(G8978): At least 40 percent but less than 60 percent impaired, limited or restricted Mobility: Walking and Moving Around Goal Status [phone](G8979): At least 20 percent but less than 40 percent impaired, limited or restricted     Marylynn PearsonLaura D Keivon Garden 03/10/2016, 10:03 AM   Conni SlipperLaura Letisia Schwalb, PT, DPT Acute Rehabilitation Services Pager: [phone]

## 2016-03-13 ENCOUNTER — Encounter (HOSPITAL_COMMUNITY): Payer: Self-pay | Admitting: Orthopedic Surgery

## 2016-03-21 NOTE — Discharge Summary (Signed)
Physician Discharge Summary  Patient ID: Steve Koch MRN: [id number] DOB/AGE: 1960-06-10 56 y.o.  Admit date: 03/09/2016 Discharge date: 03/21/2016  Admission Diagnoses:  Cervical Herniated disc  Discharge Diagnoses:  Active Problems:   Myelopathy [facility])   Past Medical History:  Diagnosis Date  . Anxiety    situational with injury  . Carpal tunnel syndrome    right hand  . GERD (gastroesophageal reflux disease)   . Hypertension   . PONV (postoperative nausea and vomiting)     Surgeries: Procedure(s): Anterior Cervical Discectomy Fusion Cervial 5-7 on 03/09/2016   Consultants (if any):   Discharged Condition: Improved  Hospital Course: Steve Koch is an 56 y.o. male who was admitted 03/09/2016 with a diagnosis of Cervical Herniated Discs and went to the operating room on 03/09/2016 and underwent the above named procedures. Post op day 1 pt reports decreased arm pain.  Pt reports incisional pain controlled on oral medication.  Pt voiding w/o difficulty.  Pt ambulating in hall. Pt cleared by OT/PT before DC.  He was given perioperative antibiotics:  Anti-infectives    Start     Dose/Rate Route Frequency Ordered Stop   03/10/16 0100  ceFAZolin (ANCEF) IVPB 2g/100 mL premix     2 g 200 mL/hr over 30 Minutes Intravenous Every 8 hours 03/09/16 1903 03/10/16 0913   03/09/16 1130  ceFAZolin (ANCEF) IVPB 2g/100 mL premix     2 g 200 mL/hr over 30 Minutes Intravenous 30 min pre-op 03/09/16 0955 03/09/16 1652    .  He was given sequential compression devices, early ambulation, and TED for DVT prophylaxis.  He benefited maximally from the hospital stay and there were no complications.    Recent vital signs:  Vitals:   03/10/16 0006 03/10/16 0400  BP: 125/78 132/81  Pulse: 68 65  Resp: 20 20  Temp: 98.4 F (36.9 C) 98.6 F (37 C)    Recent laboratory studies:  Lab Results  Component Value Date   HGB 17.1 (H) 03/07/2016   Lab Results  Component Value Date   WBC 9.7 03/07/2016   PLT 260 03/07/2016   No results found for: INR Lab Results  Component Value Date   NA 140 03/07/2016   K 4.5 03/07/2016   CL 107 03/07/2016   CO2 24 03/07/2016   BUN 16 03/07/2016   CREATININE 1.07 03/07/2016   GLUCOSE 118 (H) 03/07/2016    Discharge Medications:   Allergies as of 03/10/2016      Reactions   No Known Allergies       Medication List    STOP taking these medications   Flax Seed Oil 1000 MG Caps   LORazepam 1 MG tablet Commonly known as:  ATIVAN   meloxicam 15 MG tablet Commonly known as:  MOBIC     TAKE these medications   amLODipine 2.5 MG tablet Commonly known as:  NORVASC Take 2.5 mg by mouth daily.   methocarbamol 500 MG tablet Commonly known as:  ROBAXIN Take 1 tablet (500 mg total) by mouth 3 (three) times daily as needed for muscle spasms. What changed:  See the new instructions.   omeprazole 20 MG capsule Commonly known as:  PRILOSEC Take 20 mg by mouth daily.   ondansetron 4 MG tablet Commonly known as:  ZOFRAN Take 1 tablet (4 mg total) by mouth every 8 (eight) hours as needed for nausea or vomiting.   oxyCODONE-acetaminophen 10-325 MG tablet Commonly known as:  PERCOCET Take 1 tablet by mouth every 4 (  four) hours as needed for pain.       Diagnostic Studies: Dg Cervical Spine 2 Or 3 Views  Result Date: 03/09/2016 CLINICAL DATA:  Anterior cervical discectomy and fusion EXAM: CERVICAL SPINE - 2-3 VIEW COMPARISON:  03/09/2016 FINDINGS: Radiopaque drain or 2 being anterior to the cervical spine. There is straightening of the cervical spine. Patient is status post anterior surgical plate and screw fixation with interbody devices from C5 through C7. IMPRESSION: Status post anterior surgical plate and screw fixation from C5 through C7. Electronically Signed   By: Jasmine PangKim  Fujinaga M.D.   On: 03/09/2016 19:57   Dg Cervical Spine 2-3 Views  Result Date: 03/09/2016 CLINICAL DATA:  Anterior cervical discectomy EXAM: DG  C-ARM GT 120 MIN; CERVICAL SPINE - 2-3 VIEW CONTRAST:  None FLUOROSCOPY TIME:  Fluoroscopy Time:  34 seconds Number of Acquired Spot Images: 4 COMPARISON:  None. FINDINGS: Images demonstrate anterior plate and screw fixation C5 through C7 with interbody devices at C5-C6 and C6-C7. Endotracheal tube is present but the tip is non imaged. IMPRESSION: Intraoperative fluoroscopic assistance provided during cervical spine surgery. Electronically Signed   By: Jasmine PangKim  Fujinaga M.D.   On: 03/09/2016 19:11   Dg C-arm Gt 120 Min  Result Date: 03/09/2016 CLINICAL DATA:  Anterior cervical discectomy EXAM: DG C-ARM GT 120 MIN; CERVICAL SPINE - 2-3 VIEW CONTRAST:  None FLUOROSCOPY TIME:  Fluoroscopy Time:  34 seconds Number of Acquired Spot Images: 4 COMPARISON:  None. FINDINGS: Images demonstrate anterior plate and screw fixation C5 through C7 with interbody devices at C5-C6 and C6-C7. Endotracheal tube is present but the tip is non imaged. IMPRESSION: Intraoperative fluoroscopic assistance provided during cervical spine surgery. Electronically Signed   By: Jasmine PangKim  Fujinaga M.D.   On: 03/09/2016 19:11    Disposition: 01-Home or Self Care Pt will present to clinic in 2 weeks Post op medications provided  Discharge Instructions    Incentive spirometry RT    Complete by:  As directed       Follow-up Information    BROOKS,DAHARI D, MD. Schedule an appointment as soon as possible for a visit in 2 weeks.   Specialty:  Orthopedic Surgery Why:  If symptoms worsen, For suture removal, For wound re-check Contact information: [address] [place] [address] [phone]            Signed: Kirt BoysMayo, Anais Denslow Christina 03/21/2016, 8:47 PM

## 2016-03-24 ENCOUNTER — Encounter (HOSPITAL_COMMUNITY): Payer: Self-pay | Admitting: Orthopedic Surgery

## 2016-06-20 ENCOUNTER — Encounter (HOSPITAL_COMMUNITY): Payer: Self-pay | Admitting: Emergency Medicine

## 2016-06-20 ENCOUNTER — Emergency Department (HOSPITAL_COMMUNITY): Payer: BLUE CROSS/BLUE SHIELD

## 2016-06-20 ENCOUNTER — Observation Stay (HOSPITAL_COMMUNITY)
Admission: EM | Admit: 2016-06-20 | Discharge: 2016-06-23 | Disposition: A | Discharge status: Home / Self Care | Payer: BLUE CROSS/BLUE SHIELD | Attending: Internal Medicine | Admitting: Internal Medicine

## 2016-06-20 DIAGNOSIS — M62542 Muscle wasting and atrophy, not elsewhere classified, left hand: Secondary | ICD-10-CM | POA: Insufficient documentation

## 2016-06-20 DIAGNOSIS — Z7982 Long term (current) use of aspirin: Secondary | ICD-10-CM | POA: Insufficient documentation

## 2016-06-20 DIAGNOSIS — M62541 Muscle wasting and atrophy, not elsewhere classified, right hand: Secondary | ICD-10-CM | POA: Diagnosis not present

## 2016-06-20 DIAGNOSIS — I10 Essential (primary) hypertension: Secondary | ICD-10-CM | POA: Insufficient documentation

## 2016-06-20 DIAGNOSIS — R262 Difficulty in walking, not elsewhere classified: Secondary | ICD-10-CM | POA: Insufficient documentation

## 2016-06-20 DIAGNOSIS — Y990 Civilian activity done for income or pay: Secondary | ICD-10-CM | POA: Insufficient documentation

## 2016-06-20 DIAGNOSIS — M6281 Muscle weakness (generalized): Principal | ICD-10-CM | POA: Insufficient documentation

## 2016-06-20 DIAGNOSIS — R292 Abnormal reflex: Secondary | ICD-10-CM | POA: Diagnosis not present

## 2016-06-20 DIAGNOSIS — Z789 Other specified health status: Secondary | ICD-10-CM

## 2016-06-20 DIAGNOSIS — R2681 Unsteadiness on feet: Secondary | ICD-10-CM

## 2016-06-20 DIAGNOSIS — R531 Weakness: Secondary | ICD-10-CM

## 2016-06-20 DIAGNOSIS — Y939 Activity, unspecified: Secondary | ICD-10-CM | POA: Diagnosis not present

## 2016-06-20 DIAGNOSIS — Y929 Unspecified place or not applicable: Secondary | ICD-10-CM | POA: Insufficient documentation

## 2016-06-20 DIAGNOSIS — S0993XA Unspecified injury of face, initial encounter: Secondary | ICD-10-CM | POA: Diagnosis present

## 2016-06-20 DIAGNOSIS — W19XXXA Unspecified fall, initial encounter: Secondary | ICD-10-CM | POA: Diagnosis not present

## 2016-06-20 DIAGNOSIS — Z87891 Personal history of nicotine dependence: Secondary | ICD-10-CM | POA: Insufficient documentation

## 2016-06-20 DIAGNOSIS — Z79899 Other long term (current) drug therapy: Secondary | ICD-10-CM | POA: Insufficient documentation

## 2016-06-20 DIAGNOSIS — R209 Unspecified disturbances of skin sensation: Secondary | ICD-10-CM

## 2016-06-20 DIAGNOSIS — G959 Disease of spinal cord, unspecified: Secondary | ICD-10-CM

## 2016-06-20 LAB — CBC WITH DIFFERENTIAL/PLATELET
Basophils Absolute: 0 10*3/uL (ref 0.0–0.1)
Basophils Relative: 0 %
EOS ABS: 0.1 10*3/uL (ref 0.0–0.7)
Eosinophils Relative: 1 %
HEMATOCRIT: 41.8 % (ref 39.0–52.0)
HEMOGLOBIN: 15.3 g/dL (ref 13.0–17.0)
LYMPHS ABS: 2.4 10*3/uL (ref 0.7–4.0)
Lymphocytes Relative: 20 %
MCH: 30.7 pg (ref 26.0–34.0)
MCHC: 36.3 g/dL — AB (ref 30.0–36.0)
MCV: 83.9 fL (ref 78.0–100.0)
MONOS PCT: 7 %
Monocytes Absolute: 0.9 10*3/uL (ref 0.1–1.0)
NEUTROS PCT: 72 %
Neutro Abs: 8.7 10*3/uL — ABNORMAL HIGH (ref 1.7–7.7)
Platelets: 247 10*3/uL (ref 150–400)
RBC: 4.98 MIL/uL (ref 4.22–5.81)
RDW: 14.2 % (ref 11.5–15.5)
WBC: 12.1 10*3/uL — ABNORMAL HIGH (ref 4.0–10.5)

## 2016-06-20 MED ORDER — LORAZEPAM 1 MG PO TABS
ORAL_TABLET | ORAL | Status: AC
  Administered 2016-06-20: 1 mg via ORAL
  Filled 2016-06-20: qty 1

## 2016-06-20 MED ORDER — LORAZEPAM 1 MG PO TABS
1.0000 mg | ORAL_TABLET | Freq: Once | ORAL | Status: AC
  Administered 2016-06-20: 1 mg via ORAL

## 2016-06-20 NOTE — ED Triage Notes (Addendum)
Per EMS pt fell and hit face while using walker. No LOC. NAD.

## 2016-06-20 NOTE — ED Provider Notes (Signed)
[facility]-EM[facility]ENCY DEPT Provider Note   CSN: [id number] Arrival date & time: 06/20/16  2107     History   Chief Complaint Chief Complaint  Patient presents with  . Fall    HPI Steve Koch is a 56 y.o. male who presents emergency with chief complaint of fall. Back in October 2017. The patient had a fall at work. He began having weakness in the right arm followed by weakness in the left arm. He was diagnosed with cervical myelopathy and had a C5-7 disc fusion in February. Since that time he has had progressive weakness first in his left leg than in his right leg. 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 case. He did have a repeat MRI at no vomiting this past Friday, which I'm able to review in the system that shows no cord compression at this time. Patient also had an upper extremity EMG and was told by the electrophysiologist that he needed to see a neurologist as soon as possible. The patient is unable to grasp with his hands. He said none, no falls since his surgery in February. He states that the workers comp only give him a walker. He has significant difficulty using it because unable to grasp the handles. Patient also noted to have dysarthric speech which was attributed to the anterior cervical approach to his C-spine surgery.  HPI  Past Medical History:  Diagnosis Date  . Anxiety    situational with injury  . Carpal tunnel syndrome    right hand  . GERD (gastroesophageal reflux disease)   . Hypertension   . PONV (postoperative nausea and vomiting)     Patient Active Problem List   Diagnosis Date Noted  . Myelopathy (HCC) 03/09/2016    Past Surgical History:  Procedure Laterality Date  . ANTERIOR CERVICAL DECOMP/DISCECTOMY FUSION N/A 03/09/2016   Procedure: Anterior Cervical Discectomy Fusion Cervial 5-7;  Surgeon: Venita Lick, MD;  Location: [facility] OR;  Service: Orthopedics;  Laterality: N/A;  Requests for 3.5 hrs  . BACK SURGERY    .  COLONOSCOPY    . HAND SURGERY Left   . HERNIA REPAIR Right    inguinal  . KNEE ARTHROSCOPY Right        Home Medications    Prior to Admission medications   Medication Sig Start Date End Date Taking? Authorizing Provider  amLODipine (NORVASC) 2.5 MG tablet Take 2.5 mg by mouth daily.  02/05/16  Yes [provider]  aspirin EC 81 MG tablet Take 81 mg by mouth daily.   Yes [provider]  ibuprofen (ADVIL,MOTRIN) 200 MG tablet Take 400 mg by mouth every 6 (six) hours as needed.   Yes [provider]  omeprazole (PRILOSEC) 20 MG capsule Take 20 mg by mouth daily.   Yes [provider]  oxymetazoline (AFRIN) 0.05 % nasal spray Place 1 spray into both nostrils 2 (two) times daily as needed for congestion.   Yes [provider]    Family History Family History  Problem Relation Age of Onset  . Pneumonia Mother   . Hypertension Mother   . Diabetes Father   . Cancer Father     Social History Social History  Substance Use Topics  . Smoking status: Former Smoker    Quit date: 03/07/2006  . Smokeless tobacco: Never Used  . Alcohol use Yes     Comment: occasional     Allergies   Lyrica [pregabalin]   Review of  Systems Review of Systems Ten systems reviewed and are negative for acute change, except as noted in the HPI.    Physical Exam Updated Vital Signs There were no vitals taken for this visit.  Physical Exam  Constitutional: He appears well-developed and well-nourished. No distress.  HENT:  Head: Normocephalic.  Patient with significant swelling in the nose. There is blood in the right naris. No blood in the throat. No step-offs, crepitus, or pain to palpation of the nose,  Speech is slowed and dysarthric  Eyes: Conjunctivae and EOM are normal. Pupils are equal, round, and reactive to light. No scleral icterus.  Neck: Normal range of motion. Neck supple.  Cardiovascular: Normal rate, regular rhythm and normal heart  sounds.   Pulmonary/Chest: Effort normal and breath sounds normal. No respiratory distress.  Abdominal: Soft. There is no tenderness.  Musculoskeletal: He exhibits edema.  Neurological: He is alert.  Weakness BL upper extremities, Worse on the R. His hands are drawn and atrophied Hyperreflexia at the bilateral lower extremities, and a positive Babinski sign.   Skin: Skin is warm and dry. He is not diaphoretic.  Psychiatric: His behavior is normal.  Nursing note and vitals reviewed.    ED Treatments / Results  Labs (all labs ordered are listed, but only abnormal results are displayed) Labs Reviewed - No data to display  EKG  EKG Interpretation None       Radiology No results found.  Procedures Procedures (including critical care time)  Medications Ordered in ED Medications  LORazepam (ATIVAN) tablet 1 mg (1 mg Oral Given 06/20/16 2214)     Initial Impression / Assessment and Plan / ED Course  I have reviewed the triage vital signs and the nursing notes.  Pertinent labs & imaging results that were available during my care of the patient were reviewed by me and considered in my medical decision making (see chart for details).     Review of patient's chart shows no cervical spinal myelopathy or cord compression. After her repeat MRI this past Friday. Patient states he also had an EMG done today and was told by this outpatient electrophysiologist that he needed to see a neurologist. I have very high concern for demyelinating disorder like ALS, given his progressive weakness as well as his dysarthria and slurred speech. I spoke with Dr.Doonqah who will see the patient in the morning in the hospital. He'll be admitted for observation tonight. The patient is unsafe to ambulate given his weakness.  Final Clinical Impressions(s) / ED Diagnoses   Final diagnoses:  Fall    New Prescriptions New Prescriptions   No medications on file     Arthor CaptainHarris, Bethann Qualley, PA-C 06/21/16  0119    Loren RacerYelverton, David, MD 06/22/16 [id number]

## 2016-06-21 ENCOUNTER — Encounter (HOSPITAL_COMMUNITY): Payer: Self-pay

## 2016-06-21 DIAGNOSIS — M62541 Muscle wasting and atrophy, not elsewhere classified, right hand: Secondary | ICD-10-CM | POA: Diagnosis not present

## 2016-06-21 DIAGNOSIS — W19XXXA Unspecified fall, initial encounter: Secondary | ICD-10-CM | POA: Diagnosis not present

## 2016-06-21 DIAGNOSIS — R292 Abnormal reflex: Secondary | ICD-10-CM

## 2016-06-21 DIAGNOSIS — I10 Essential (primary) hypertension: Secondary | ICD-10-CM

## 2016-06-21 DIAGNOSIS — Z789 Other specified health status: Secondary | ICD-10-CM

## 2016-06-21 DIAGNOSIS — G959 Disease of spinal cord, unspecified: Secondary | ICD-10-CM

## 2016-06-21 DIAGNOSIS — M62542 Muscle wasting and atrophy, not elsewhere classified, left hand: Secondary | ICD-10-CM | POA: Diagnosis not present

## 2016-06-21 DIAGNOSIS — R2681 Unsteadiness on feet: Secondary | ICD-10-CM | POA: Diagnosis not present

## 2016-06-21 DIAGNOSIS — R531 Weakness: Secondary | ICD-10-CM | POA: Diagnosis not present

## 2016-06-21 DIAGNOSIS — R209 Unspecified disturbances of skin sensation: Secondary | ICD-10-CM

## 2016-06-21 LAB — COMPREHENSIVE METABOLIC PANEL
ALK PHOS: 69 U/L (ref 38–126)
ALT: 21 U/L (ref 17–63)
AST: 18 U/L (ref 15–41)
Albumin: 4 g/dL (ref 3.5–5.0)
Anion gap: 6 (ref 5–15)
BILIRUBIN TOTAL: 1.2 mg/dL (ref 0.3–1.2)
BUN: 12 mg/dL (ref 6–20)
CALCIUM: 9.5 mg/dL (ref 8.9–10.3)
CHLORIDE: 107 mmol/L (ref 101–111)
CO2: 28 mmol/L (ref 22–32)
CREATININE: 0.8 mg/dL (ref 0.61–1.24)
GFR calc Af Amer: >60 mL/min (ref >60)
Glucose, Bld: 98 mg/dL (ref 65–99)
Potassium: 4.5 mmol/L (ref 3.5–5.1)
Sodium: 141 mmol/L (ref 135–145)
Total Protein: 6.9 g/dL (ref 6.5–8.1)

## 2016-06-21 LAB — VITAMIN B12: VITAMIN B 12: 228 pg/mL (ref 180–914)

## 2016-06-21 LAB — CBC
HEMATOCRIT: 41.9 % (ref 39.0–52.0)
HEMOGLOBIN: 15.1 g/dL (ref 13.0–17.0)
MCH: 30.4 pg (ref 26.0–34.0)
MCHC: 36 g/dL (ref 30.0–36.0)
MCV: 84.3 fL (ref 78.0–100.0)
PLATELETS: 250 10*3/uL (ref 150–400)
RBC: 4.97 MIL/uL (ref 4.22–5.81)
RDW: 14.2 % (ref 11.5–15.5)
WBC: 10.2 10*3/uL (ref 4.0–10.5)

## 2016-06-21 LAB — BASIC METABOLIC PANEL
Anion gap: 9 (ref 5–15)
BUN: 12 mg/dL (ref 6–20)
CHLORIDE: 105 mmol/L (ref 101–111)
CO2: 25 mmol/L (ref 22–32)
CREATININE: 0.77 mg/dL (ref 0.61–1.24)
Calcium: 9.2 mg/dL (ref 8.9–10.3)
GFR calc Af Amer: >60 mL/min (ref >60)
GFR calc non Af Amer: >60 mL/min (ref >60)
GLUCOSE: 115 mg/dL — AB (ref 65–99)
Potassium: 3.7 mmol/L (ref 3.5–5.1)
SODIUM: 139 mmol/L (ref 135–145)

## 2016-06-21 LAB — URINALYSIS, COMPLETE (UACMP) WITH MICROSCOPIC
BILIRUBIN URINE: NEGATIVE
Glucose, UA: NEGATIVE mg/dL
Hgb urine dipstick: NEGATIVE
Ketones, ur: NEGATIVE mg/dL
LEUKOCYTES UA: NEGATIVE
Nitrite: NEGATIVE
Protein, ur: NEGATIVE mg/dL
RBC / HPF: NONE SEEN RBC/hpf (ref 0–5)
SPECIFIC GRAVITY, URINE: 1.006 (ref 1.005–1.030)
WBC, UA: NONE SEEN WBC/hpf (ref 0–5)
pH: 7 (ref 5.0–8.0)

## 2016-06-21 LAB — TSH: TSH: 1.758 u[IU]/mL (ref 0.350–4.500)

## 2016-06-21 MED ORDER — ONDANSETRON HCL 4 MG PO TABS: 4.0000 mg | ORAL_TABLET | Freq: Four times a day (QID) | ORAL | Status: DC | PRN

## 2016-06-21 MED ORDER — ONDANSETRON HCL 4 MG/2ML IJ SOLN: 4.0000 mg | Freq: Four times a day (QID) | INTRAMUSCULAR | Status: DC | PRN

## 2016-06-21 MED ORDER — PANTOPRAZOLE SODIUM 40 MG PO TBEC
40.0000 mg | DELAYED_RELEASE_TABLET | Freq: Every day | ORAL | Status: DC
  Administered 2016-06-21 – 2016-06-23 (×3): 40 mg via ORAL
  Filled 2016-06-21 (×3): qty 1

## 2016-06-21 MED ORDER — SODIUM CHLORIDE 0.9 % IV SOLN
INTRAVENOUS | Status: DC
  Administered 2016-06-21: 04:00:00 via INTRAVENOUS

## 2016-06-21 MED ORDER — CYANOCOBALAMIN 1000 MCG/ML IJ SOLN
1000.0000 ug | Freq: Every day | INTRAMUSCULAR | Status: DC
  Administered 2016-06-21 – 2016-06-22 (×2): 1000 ug via INTRAMUSCULAR
  Filled 2016-06-21 (×2): qty 1

## 2016-06-21 MED ORDER — AMLODIPINE BESYLATE 5 MG PO TABS
2.5000 mg | ORAL_TABLET | Freq: Every day | ORAL | Status: DC
  Administered 2016-06-21 – 2016-06-23 (×3): 2.5 mg via ORAL
  Filled 2016-06-21 (×3): qty 1

## 2016-06-21 MED ORDER — ENOXAPARIN SODIUM 40 MG/0.4ML ~~LOC~~ SOLN
40.0000 mg | SUBCUTANEOUS | Status: DC
  Administered 2016-06-21 – 2016-06-23 (×3): 40 mg via SUBCUTANEOUS
  Filled 2016-06-21 (×3): qty 0.4

## 2016-06-21 MED ORDER — IBUPROFEN 400 MG PO TABS
400.0000 mg | ORAL_TABLET | Freq: Four times a day (QID) | ORAL | Status: DC | PRN
  Administered 2016-06-21 – 2016-06-22 (×2): 400 mg via ORAL
  Filled 2016-06-21 (×2): qty 1

## 2016-06-21 MED ORDER — LORAZEPAM 1 MG PO TABS
1.0000 mg | ORAL_TABLET | Freq: Every evening | ORAL | Status: DC | PRN
  Administered 2016-06-21 – 2016-06-22 (×2): 1 mg via ORAL
  Filled 2016-06-21 (×2): qty 1

## 2016-06-21 MED ORDER — ASPIRIN EC 81 MG PO TBEC
81.0000 mg | DELAYED_RELEASE_TABLET | Freq: Every day | ORAL | Status: DC
  Administered 2016-06-21 – 2016-06-23 (×3): 81 mg via ORAL
  Filled 2016-06-21 (×3): qty 1

## 2016-06-21 MED ORDER — VITAMIN B-12 1000 MCG PO TABS
500.0000 ug | ORAL_TABLET | Freq: Every day | ORAL | Status: DC
  Administered 2016-06-22 – 2016-06-23 (×2): 500 ug via ORAL
  Filled 2016-06-21: qty 5
  Filled 2016-06-21: qty 1

## 2016-06-21 NOTE — Progress Notes (Signed)
PROGRESS NOTE  Steve ShadCharles Mclelland WUJ:[id number] DOB: September 03, 1960 DOA: 06/20/2016 PCP: [facility], [facility]  Brief History:  56 year old male with a history of hypertension and GERD presents with R>L upper extremity weakness and bilateral lower extremity weakness.  The patient had a work-related accident when he fell off his [employer] truck in October 2017 resulting in cervical myelopathy from herniated nucleus pulposus. The patient was taken to surgery ( [facility]) on 03/09/2016 performed by Dr. Venita Lickahari Brooks. Since the surgery, the patient states that his upper extremity and lower extremity weakness have been fairly stable without significant improvement. He has had numerous falls, nine according to the patient, since the surgery. He relates that many of his falls have been attributed to his upper extremity weakness as well as "clumsy legs".  He denies any bowel or bladder incontinence. He denies any neck or lower back pain. He denies any fevers, chills, chest pain, shortness breath, nausea, vomiting, diarrhea, abdominal pain, dysuria. Because of his progressive falls, the patient presented to the hospital and neurology consultation was obtained.   Assessment/Plan: Cervical myelopathy with upper and lower extremity weakness -03/09/2016--anterior cervical discectomy fusion--C5-7--Dr. Venita Lickahari Brooks -neurology consult--concern for other demyelinating disorder -apparently had abnormal Nerve Conduction study 06/20/16  -PT and OT eval -06/16/2016 MRI cervical spine--persistent abutment of osteophyte of right ventral spinal cord C5-6 without spinal cord abnormal signal--no other stenosis, no hardware failure -06/20/2016 CT cervical spine--no fracture, subluxation, prevertebral hematoma  Essential hypertension -continue amlodipine  Frequent falls with scalp hematoma/Gait Instability -PT/OT -06/20/2016 CT maxillofacial--no skull fracture, positive right scalp hematoma -check  UA/cultlure -check B12 -check TSH  GERD -continue PPI  Sensory disturbance -Numbness and tingling in the bilateral great toes -Consult neurology    Disposition Plan:   SNF vs inpatient rehab Family Communication:   Spouse updated at bedside--Total time spent 35 minutes.  Greater than 50% spent face to face counseling and coordinating care. [phone] to [phone]   Consultants:  neurology  Code Status:  FULL   DVT Prophylaxis:   [place] Lovenox   Procedures: As Listed in Progress Note Above  Antibiotics: Steve Koch    Subjective: Patient denies fevers, chills, headache, chest pain, dyspnea, nausea, vomiting, diarrhea, abdominal pain, dysuria, hematuria, hematochezia, and melena.  denies any bowel or bladder incontinence. Complains of persistent numbness and tingling in the bilateral great toes.   Objective: Vitals:   06/20/16 2200 06/21/16 0227 06/21/16 0619  BP: (!) 147/95 120/87 122/75  Pulse: 73 65 64  Resp:  16 18  Temp:   98.3 F (36.8 C)  TempSrc:   Oral  SpO2: 97% 97% 97%  Weight:   116.9 kg (257 lb 11.5 oz)  Height:   6\' 1"  (1.854 m)    Intake/Output Summary (Last 24 hours) at 06/21/16 0910 Last data filed at 06/21/16 0400  Gross per 24 hour  Intake             1.17 ml  Output                0 ml  Net             1.17 ml   Weight change:  Exam:   General:  Pt is alert, follows commands appropriately, not in acute distress  HEENT: No icterus, No thrush, No neck mass, [place]/AT  Cardiovascular: RRR, S1/S2, no rubs, no gallops  Respiratory: CTA bilaterally, no wheezing, no crackles, no rhonchi  Abdomen: Soft/+BS, non tender, non  distended, no guarding  Extremities: No edema, No lymphangitis, No petechiae, No rashes, no  synovitis Neuro:  CN II-XII intact, strength 4-/5 in RUE, RLE, strength 4/5 LUE, LLE; sensation intact bilateral; no dysmetria; babinski equivocal     Data Reviewed: I have personally reviewed following labs and imaging studies Basic  Metabolic Panel:  Recent Labs Lab 06/20/16 2337 06/21/16 0528  NA 139 141  K 3.7 4.5  CL 105 107  CO2 25 28  GLUCOSE 115* 98  BUN 12 12  CREATININE 0.77 0.80  CALCIUM 9.2 9.5   Liver Function Tests:  Recent Labs Lab 06/21/16 0528  AST 18  ALT 21  ALKPHOS 69  BILITOT 1.2  PROT 6.9  ALBUMIN 4.0   No results for input(s): LIPASE, AMYLASE in the last 168 hours. No results for input(s): AMMONIA in the last 168 hours. Coagulation Profile: No results for input(s): INR, PROTIME in the last 168 hours. CBC:  Recent Labs Lab 06/20/16 2337 06/21/16 0528  WBC 12.1* 10.2  NEUTROABS 8.7*  --   HGB 15.3 15.1  HCT 41.8 41.9  MCV 83.9 84.3  PLT 247 250   Cardiac Enzymes: No results for input(s): CKTOTAL, CKMB, CKMBINDEX, TROPONINI in the last 168 hours. BNP: Invalid input(s): POCBNP CBG: No results for input(s): GLUCAP in the last 168 hours. HbA1C: No results for input(s): HGBA1C in the last 72 hours. Urine analysis: No results found for: COLORURINE, APPEARANCEUR, LABSPEC, PHURINE, GLUCOSEU, HGBUR, BILIRUBINUR, KETONESUR, PROTEINUR, UROBILINOGEN, NITRITE, LEUKOCYTESUR Sepsis Labs: @LABRCNTIP (procalcitonin:4,lacticidven:4) )No results found for this or any previous visit (from the past 240 hour(s)).   Scheduled Meds: . amLODipine  2.5 mg Oral Daily  . aspirin EC  81 mg Oral Daily  . enoxaparin (LOVENOX) injection  40 mg Subcutaneous Q24H  . pantoprazole  40 mg Oral Daily   Continuous Infusions: . sodium chloride 10 mL/hr at 06/21/16 0353    Procedures/Studies: Ct Head Wo Contrast  Result Date: 06/20/2016 CLINICAL DATA:  Fall striking face while using walker. No loss of consciousness. EXAM: CT HEAD WITHOUT CONTRAST CT MAXILLOFACIAL WITHOUT CONTRAST CT CERVICAL SPINE WITHOUT CONTRAST TECHNIQUE: Multidetector CT imaging of the head, cervical spine, and maxillofacial structures were performed using the standard protocol without intravenous contrast. Multiplanar CT image  reconstructions of the cervical spine and maxillofacial structures were also generated. COMPARISON:  Steve Koch. FINDINGS: CT HEAD FINDINGS Brain: No evidence of acute infarction, hemorrhage, hydrocephalus, extra-axial collection or mass lesion/mass effect. Vascular: No hyperdense vessel or unexpected calcification. Skull: No skull fracture.  Right frontal scalp hematoma. Other: Steve Koch. CT MAXILLOFACIAL FINDINGS Osseous: No acute facial bone fracture. Possible remote nasal bone fracture. Zygomatic arches, mandibles, and pterygoid plates are intact. There is anterior subluxation of the a right temporomandibular joint. Orbits: No orbital fracture.  Both orbits and globes are intact. Sinuses: No fluid levels. Scattered mucosal thickening in the maxillary sinuses and ethmoid air cells. Soft tissues: Right frontal scalp hematoma. CT CERVICAL SPINE FINDINGS Alignment: Straightening of normal lordosis. No listhesis, jumped or perched facets. Lateral masses of C1 are well aligned on C2. Skull base and vertebrae: No acute fracture. Dens and skullbase are intact. Anterior fusion C5 through C7 with intact hardware. Non fusion posterior elements of C1, a normal variant. Soft tissues and spinal canal: No prevertebral fluid or swelling. No visible canal hematoma. Disc levels: Disc spacers at C5-C6 and C6-C7. Minimal endplate spurring at C4-C5. Upper chest: No acute abnormality. Other: Steve Koch. IMPRESSION: 1. Right frontal scalp hematoma. No skull fracture or acute  intracranial abnormality. 2. No facial bone fracture. 3. Postsurgical change in the cervical spine with intact hardware. No fracture or acute subluxation. Electronically Signed   By: Rubye Oaks M.D.   On: 06/20/2016 23:10   Ct Cervical Spine Wo Contrast  Result Date: 06/20/2016 CLINICAL DATA:  Fall striking face while using walker. No loss of consciousness. EXAM: CT HEAD WITHOUT CONTRAST CT MAXILLOFACIAL WITHOUT CONTRAST CT CERVICAL SPINE WITHOUT CONTRAST TECHNIQUE:  Multidetector CT imaging of the head, cervical spine, and maxillofacial structures were performed using the standard protocol without intravenous contrast. Multiplanar CT image reconstructions of the cervical spine and maxillofacial structures were also generated. COMPARISON:  Steve Koch. FINDINGS: CT HEAD FINDINGS Brain: No evidence of acute infarction, hemorrhage, hydrocephalus, extra-axial collection or mass lesion/mass effect. Vascular: No hyperdense vessel or unexpected calcification. Skull: No skull fracture.  Right frontal scalp hematoma. Other: Steve Koch. CT MAXILLOFACIAL FINDINGS Osseous: No acute facial bone fracture. Possible remote nasal bone fracture. Zygomatic arches, mandibles, and pterygoid plates are intact. There is anterior subluxation of the a right temporomandibular joint. Orbits: No orbital fracture.  Both orbits and globes are intact. Sinuses: No fluid levels. Scattered mucosal thickening in the maxillary sinuses and ethmoid air cells. Soft tissues: Right frontal scalp hematoma. CT CERVICAL SPINE FINDINGS Alignment: Straightening of normal lordosis. No listhesis, jumped or perched facets. Lateral masses of C1 are well aligned on C2. Skull base and vertebrae: No acute fracture. Dens and skullbase are intact. Anterior fusion C5 through C7 with intact hardware. Non fusion posterior elements of C1, a normal variant. Soft tissues and spinal canal: No prevertebral fluid or swelling. No visible canal hematoma. Disc levels: Disc spacers at C5-C6 and C6-C7. Minimal endplate spurring at C4-C5. Upper chest: No acute abnormality. Other: Steve Koch. IMPRESSION: 1. Right frontal scalp hematoma. No skull fracture or acute intracranial abnormality. 2. No facial bone fracture. 3. Postsurgical change in the cervical spine with intact hardware. No fracture or acute subluxation. Electronically Signed   By: Rubye Oaks M.D.   On: 06/20/2016 23:10   Ct Maxillofacial Wo Contrast  Result Date: 06/20/2016 CLINICAL DATA:  Fall  striking face while using walker. No loss of consciousness. EXAM: CT HEAD WITHOUT CONTRAST CT MAXILLOFACIAL WITHOUT CONTRAST CT CERVICAL SPINE WITHOUT CONTRAST TECHNIQUE: Multidetector CT imaging of the head, cervical spine, and maxillofacial structures were performed using the standard protocol without intravenous contrast. Multiplanar CT image reconstructions of the cervical spine and maxillofacial structures were also generated. COMPARISON:  Steve Koch. FINDINGS: CT HEAD FINDINGS Brain: No evidence of acute infarction, hemorrhage, hydrocephalus, extra-axial collection or mass lesion/mass effect. Vascular: No hyperdense vessel or unexpected calcification. Skull: No skull fracture.  Right frontal scalp hematoma. Other: Steve Koch. CT MAXILLOFACIAL FINDINGS Osseous: No acute facial bone fracture. Possible remote nasal bone fracture. Zygomatic arches, mandibles, and pterygoid plates are intact. There is anterior subluxation of the a right temporomandibular joint. Orbits: No orbital fracture.  Both orbits and globes are intact. Sinuses: No fluid levels. Scattered mucosal thickening in the maxillary sinuses and ethmoid air cells. Soft tissues: Right frontal scalp hematoma. CT CERVICAL SPINE FINDINGS Alignment: Straightening of normal lordosis. No listhesis, jumped or perched facets. Lateral masses of C1 are well aligned on C2. Skull base and vertebrae: No acute fracture. Dens and skullbase are intact. Anterior fusion C5 through C7 with intact hardware. Non fusion posterior elements of C1, a normal variant. Soft tissues and spinal canal: No prevertebral fluid or swelling. No visible canal hematoma. Disc levels: Disc spacers at C5-C6 and C6-C7. Minimal endplate  spurring at C4-C5. Upper chest: No acute abnormality. Other: Steve Koch. IMPRESSION: 1. Right frontal scalp hematoma. No skull fracture or acute intracranial abnormality. 2. No facial bone fracture. 3. Postsurgical change in the cervical spine with intact hardware. No fracture or  acute subluxation. Electronically Signed   By: Rubye Oaks M.D.   On: 06/20/2016 23:10    Rithik Odea, DO  [facility] Pager [phone]  If 7PM-7AM, please contact night-coverage [email or web address] Password [email or web address] 06/21/2016, 9:10 AM   LOS: 0 days

## 2016-06-21 NOTE — Care Management Note (Signed)
Case Management Note  Patient Details  Name: Steve Koch MRN: [id number] Date of Birth: March 10, 1960  Subjective/Objective:                  Admitted after falling at home. 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 Pt's PCP, Ortho and OP therapy's have been attempting to get pt motorized wheelchair and into CIR program but have been unsuccessful in working with his workers comp. Pt using walker and falling frequently pta. Pt's wife and other family at bedside. [facility] and [facility] will be formally consulted once OT has seen pt and made recommendation. CSW is aware SNF needs to be back up plan.   Action/Plan: CM will cont to follow, awaiting OT consult and recommendations.    Expected Discharge Date:     06/23/2016             Expected Discharge Plan:  IP Rehab Facility  In-House Referral:  Clinical Social Work  Discharge planning Services  CM Consult  Post Acute Care Choice:  NA Choice offered to:  NA  Status of Service:  In process, will continue to follow  Malcolm MetroChildress, Dayra Rapley Demske, RN 06/21/2016, 2:37 PM

## 2016-06-21 NOTE — Plan of Care (Signed)
Problem: Acute Rehab PT Goals(only PT should resolve) Goal: Pt Will Ambulate Platform attachments

## 2016-06-21 NOTE — Evaluation (Signed)
Physical Therapy Evaluation Patient Details Name: Steve Koch MRN: [id number] DOB: May 02, 1960 Today's Date: 06/21/2016   History of Present Illness  56 yo male with onset of injury to neck at C5-7 with fusion (03-09-16) after falling backward on a loading dock at work Oct 2017, now admitted with ninth fall since surgery.  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  He is at work with wife when not at therapy during the day.  Clinical Impression  Pt is demonstrating significant decline of his mobility with fall history that is significant since his c spine surgery in Feb 2018.  He is not secure with his control of even sitting balance, and would benefit from inpt rehab to increase his balance, strength of LE's, correct hip and ankle contractures and manage his tone with B Babinski and mild clonus on ankles.  Will continue acutely to strengthen and progress toward gait and more independent transfers.    Follow Up Recommendations CIR    Equipment Recommendations  None recommended by PT    Recommendations for Other Services OT consult     Precautions / Restrictions Precautions Precautions: Fall;Cervical Precaution Comments: No brace but still protective mobility Restrictions Weight Bearing Restrictions: No      Mobility  Bed Mobility Overal bed mobility: Needs Assistance Bed Mobility: Supine to Sit;Sit to Supine     Supine to sit: Max assist;+2 for physical assistance;+2 for safety/equipment Sit to supine: Mod assist;+2 for physical assistance;+2 for safety/equipment   General bed mobility comments: Pt requires verbal preparation for sequencing due to his limitations  Transfers Overall transfer level: Needs assistance Equipment used: Rolling walker (2 wheeled);1 person hand held assist Transfers: Sit to/from Stand Sit to Stand: +2 physical assistance;+2 safety/equipment;From  elevated surface;Mod assist         General transfer comment: Pt stands well once he is assisted to power up  Ambulation/Gait Ambulation/Gait assistance: +2 physical assistance;+2 safety/equipment;Mod assist;Min assist Ambulation Distance (Feet): 4 Feet Assistive device: Rolling walker (2 wheeled) Gait Pattern/deviations: Step-to pattern;Decreased stride length;Wide base of support;Trunk flexed;Shuffle Gait velocity: reduced Gait velocity interpretation: Below normal speed for age/gender General Gait Details: weak sidesteppign with most difficulty lifting off the R leg to step esp laterally  Stairs            Wheelchair Mobility    Modified Rankin (Stroke Patients Only)       Balance Overall balance assessment: History of Falls;Needs assistance Sitting-balance support: Bilateral upper extremity supported;Feet unsupported Sitting balance-Leahy Scale: Fair     Standing balance support: Bilateral upper extremity supported Standing balance-Leahy Scale: Poor                               Pertinent Vitals/Pain Pain Assessment: No/denies pain    Home Living Family/patient expects to be discharged to:: Private residence Living Arrangements: Spouse/significant other Available Help at Discharge: Family;Available 24 hours/day Type of Home: House Home Access: Stairs to enter Entrance Stairs-Rails: None Entrance Stairs-Number of Steps: 4 Home Layout: One level Home Equipment: Walker - 2 wheels;Cane - single point;Shower seat;Grab bars - toilet;Grab bars - tub/shower      Prior Function Level of Independence: Needs assistance   Gait / Transfers Assistance Needed: wife keeps hands on with RW but tends to lose control for ward on walker  ADL's / Homemaking Assistance Needed: Wife assisting him  for personal care and handles housework        Hand Dominance   Dominant Hand: Right    Extremity/Trunk Assessment   Upper Extremity Assessment Upper  Extremity Assessment: Generalized weakness    Lower Extremity Assessment Lower Extremity Assessment: Generalized weakness    Cervical / Trunk Assessment Cervical / Trunk Assessment: Other exceptions (Feb cervical fusion lower c-spine)  Communication   Communication: No difficulties  Cognition Arousal/Alertness: Awake/alert Behavior During Therapy: WFL for tasks assessed/performed Overall Cognitive Status: Within Functional Limits for tasks assessed                                        General Comments General comments (skin integrity, edema, etc.): Pt is up to walk with PT sidestepping only, very motivated and with wife who is his caregiver and takes with her to work    Exercises Other Exercises Other Exercises: DF is 0 deg and cannot use DF's effectively to move ankles   Assessment/Plan    PT Assessment Patient needs continued PT services  PT Problem List Decreased strength;Decreased range of motion;Decreased activity tolerance;Decreased balance;Decreased mobility;Decreased coordination;Decreased knowledge of use of DME;Decreased safety awareness;Decreased knowledge of precautions;Impaired sensation;Obesity       PT Treatment Interventions DME instruction;Gait training;Stair training;Functional mobility training;Therapeutic exercise;Therapeutic activities;Balance training;Neuromuscular re-education;Patient/family education    PT Goals (Current goals can be found in the Care Plan section)  Acute Rehab PT Goals Patient Stated Goal: to get safer with walker and get stronger PT Goal Formulation: With patient/family Time For Goal Achievement: 07/05/16 Potential to Achieve Goals: Good    Frequency Min 3X/week   Barriers to discharge Inaccessible home environment;Decreased caregiver support requires 2 person assist and wife is his caregiver    Co-evaluation               AM-PAC PT "6 Clicks" Daily Activity  Outcome Measure Difficulty turning over in  bed (including adjusting bedclothes, sheets and blankets)?: Total Difficulty moving from lying on back to sitting on the side of the bed? : Total Difficulty sitting down on and standing up from a chair with arms (e.g., wheelchair, bedside commode, etc,.)?: Total Help needed moving to and from a bed to chair (including a wheelchair)?: A Lot Help needed walking in hospital room?: A Lot Help needed climbing 3-5 steps with a railing? : Total 6 Click Score: 8    End of Session Equipment Utilized During Treatment: Gait belt Activity Tolerance: Patient tolerated treatment well;Patient limited by fatigue Patient left: in bed;with call bell/phone within reach;with bed alarm set;with family/visitor present Nurse Communication: Mobility status PT Visit Diagnosis: Unsteadiness on feet (R26.81);Repeated falls (R29.6);Muscle weakness (generalized) (M62.81)    Time: 1610-9604 PT Time Calculation (min) (ACUTE ONLY): 43 min   Charges:   PT Evaluation $PT Eval Moderate Complexity: 1 Procedure PT Treatments $Therapeutic Exercise: 8-22 mins $Therapeutic Activity: 8-22 mins   PT G Codes:   PT G-Codes **NOT FOR INPATIENT CLASS** Functional Assessment Tool Used: AM-PAC 6 Clicks Basic Mobility;Clinical judgement Functional Limitation: Mobility: Walking and moving around Mobility: Walking and Moving Around Current Status (V4098): At least 60 percent but less than 80 percent impaired, limited or restricted Mobility: Walking and Moving Around Goal Status [phone]): At least 40 percent but less than 60 percent impaired, limited or restricted    Ivar Drape 06/21/2016, 1:05 PM   Samul Dada, PT MS Acute Rehab Dept. Number:  [place] [phone] and [place]

## 2016-06-21 NOTE — H&P (Signed)
TRH H&P    Patient Demographics:    Steve Koch Koch, is a 56 y.o. male  MRN: [id number]  DOB - 1961/01/01  Admit Date - 06/20/2016  Referring MD/NP/PA: Cammy CopaAbigail  Outpatient [facility] the [facility]  Patient coming from: Home  Chief Complaint  Patient presents with  . Fall      HPI:    Steve Koch  is a 56 y.o. male, Came to hospital with complaints of fall. 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 Done by Dr. Shon BatonBrooks. Since then patient has had progressive weakness first and left leg and the right leg. 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 Otherwise no complicating features.  The patient had upper extremity EMG and was told by electrophysiologist that he to see a neurologist as soon as possible. Patient is unable to use his right hand. Patient fell as he was able to grasp the walkers handles. He denies passing out. Denies chest pain, shortness of breath. No nausea vomiting or diarrhea. No fever or dysuria.    Review of systems:      A full 10 point Review of Systems was done, except as stated above, all other Review of Systems were negative.   With Past History of the following :    Past Medical History:  Diagnosis Date  . Anxiety    situational with injury  . Carpal tunnel syndrome    right hand  . GERD (gastroesophageal reflux disease)   . Hypertension   . PONV (postoperative nausea and vomiting)       Past Surgical History:  Procedure Laterality Date  . ANTERIOR CERVICAL DECOMP/DISCECTOMY FUSION N/A 03/09/2016   Procedure: Anterior Cervical Discectomy Fusion Cervial 5-7;   Surgeon: Venita Lickahari Brooks, MD;  [facility];  Service: Orthopedics;  Laterality: N/A;  Requests for 3.5 hrs  . BACK SURGERY    . COLONOSCOPY    . HAND SURGERY Left   . HERNIA REPAIR Right    inguinal  . KNEE ARTHROSCOPY Right       Social History:      Social History  Substance Use Topics  . Smoking status: Former Smoker    Quit date: 03/07/2006  . Smokeless tobacco: Never Used  . Alcohol use Yes     Comment: occasional       Family History :     Family History  Problem Relation Age of Onset  . Pneumonia Mother   . Hypertension Mother   . Diabetes Father   . Cancer Father       Home Medications:   Prior to Admission medications   Medication Sig Start Date End Date Taking? Authorizing Provider  amLODipine (NORVASC) 2.5 MG tablet Take 2.5 mg by mouth daily.  02/05/16  Yes [provider]  aspirin EC 81 MG tablet Take 81 mg by mouth daily.   Yes [provider]  ibuprofen (ADVIL,MOTRIN) 200 MG tablet Take 400 mg by mouth every 6 (six) hours as needed.   Yes [provider]  omeprazole (PRILOSEC) 20 MG capsule Take 20 mg by mouth daily.   Yes [provider]  oxymetazoline (AFRIN) 0.05 % nasal spray Place 1 spray into both nostrils 2 (two) times daily as needed for congestion.   Yes [provider]     Allergies:     Allergies  Allergen Reactions  . Lyrica [Pregabalin] Swelling     Physical Exam:   Vitals  Blood pressure (!) 147/95, pulse 73, SpO2 97 %.  1.  General: Appears in no acute distress  2. Psychiatric:  Intact judgement and  insight, awake alert, oriented x 3.  3. Neurologic: Motor strength 4/5 and left upper extremity, 2/5 in right upper extremity, 5/5 in bilateral lower extremities.DTRs 2+ bilaterally. Slurred speech  4. Eyes :  anicteric sclerae, moist conjunctivae with no lid lag. PERRLA.  5. ENMT:  Swelling of the nose  6. Neck:  supple, no cervical lymphadenopathy appriciated, No  thyromegaly  7. Respiratory : Normal respiratory effort, good air movement bilaterally,clear to  auscultation bilaterally  8. Cardiovascular : RRR, no gallops, rubs or murmurs, no leg edema  9. Gastrointestinal:  Positive bowel sounds, abdomen soft, non-tender to palpation,no hepatosplenomegaly    Data Review:    CBC  Recent Labs Lab 06/20/16 2337  WBC 12.1*  HGB 15.3  HCT 41.8  PLT 247  MCV 83.9  MCH 30.7  MCHC 36.3*  RDW 14.2  LYMPHSABS 2.4  MONOABS 0.9  EOSABS 0.1  BASOSABS 0.0   ------------------------------------------------------------------------------------------------------------------  Chemistries   Recent Labs Lab 06/20/16 2337  NA 139  K 3.7  CL 105  CO2 25  GLUCOSE 115*  BUN 12  CREATININE 0.77  CALCIUM 9.2   ------------------------------------------------------------------------------------------------------------------  ------------------------------------------------------------------------------------------------------------------ GFR: CrCl cannot be calculated (Unknown ideal weight.). Liver Function Tests: No results for input(s): AST, ALT, ALKPHOS, BILITOT, PROT, ALBUMIN in the last 168 hours. No results for input(s): LIPASE, AMYLASE in the last 168 hours. No results for input(s): AMMONIA in the last 168 hours. Coagulation Profile: No results for input(s): INR, PROTIME in the last 168 hours. Cardiac Enzymes: No results for input(s): CKTOTAL, CKMB, CKMBINDEX, TROPONINI in the last 168 hours. BNP (last 3 results) No results for input(s): PROBNP in the last 8760 hours. HbA1C: No results for input(s): HGBA1C in the last 72 hours. CBG: No results for input(s): GLUCAP in the last 168 hours. Lipid Profile: No results for input(s): CHOL, HDL, LDLCALC, TRIG, CHOLHDL, LDLDIRECT in the last 72 hours. Thyroid Function Tests: No results for input(s): TSH, T4TOTAL, FREET4, T3FREE, THYROIDAB in the last 72 hours. Anemia Panel: No results  for input(s): VITAMINB12, FOLATE, FERRITIN, TIBC, IRON, RETICCTPCT in the last 72 hours.  --------------------------------------------------------------------------------------------------------------- Urine analysis: No results found for: COLORURINE, APPEARANCEUR, LABSPEC, PHURINE, GLUCOSEU, HGBUR, BILIRUBINUR, KETONESUR, PROTEINUR, UROBILINOGEN, NITRITE, LEUKOCYTESUR    Imaging Results:    Ct Head Wo Contrast  Result Date: 06/20/2016 CLINICAL DATA:  Fall striking face while using walker. No loss of consciousness. EXAM: CT HEAD WITHOUT CONTRAST CT MAXILLOFACIAL WITHOUT CONTRAST CT CERVICAL SPINE WITHOUT CONTRAST TECHNIQUE: Multidetector CT imaging of the head, cervical spine, and maxillofacial structures were performed using the standard protocol without intravenous contrast. Multiplanar CT image reconstructions of the cervical spine and maxillofacial structures were  also generated. COMPARISON:  None. FINDINGS: CT HEAD FINDINGS Brain: No evidence of acute infarction, hemorrhage, hydrocephalus, extra-axial collection or mass lesion/mass effect. Vascular: No hyperdense vessel or unexpected calcification. Skull: No skull fracture.  Right frontal scalp hematoma. Other: None. CT MAXILLOFACIAL FINDINGS Osseous: No acute facial bone fracture. Possible remote nasal bone fracture. Zygomatic arches, mandibles, and pterygoid plates are intact. There is anterior subluxation of the a right temporomandibular joint. Orbits: No orbital fracture.  Both orbits and globes are intact. Sinuses: No fluid levels. Scattered mucosal thickening in the maxillary sinuses and ethmoid air cells. Soft tissues: Right frontal scalp hematoma. CT CERVICAL SPINE FINDINGS Alignment: Straightening of normal lordosis. No listhesis, jumped or perched facets. Lateral masses of C1 are well aligned on C2. Skull base and vertebrae: No acute fracture. Dens and skullbase are intact. Anterior fusion C5 through C7 with intact hardware. Non fusion  posterior elements of C1, a normal variant. Soft tissues and spinal canal: No prevertebral fluid or swelling. No visible canal hematoma. Disc levels: Disc spacers at C5-C6 and C6-C7. Minimal endplate spurring at C4-C5. Upper chest: No acute abnormality. Other: None. IMPRESSION: 1. Right frontal scalp hematoma. No skull fracture or acute intracranial abnormality. 2. No facial bone fracture. 3. Postsurgical change in the cervical spine with intact hardware. No fracture or acute subluxation. Electronically Signed   By: Rubye Oaks M.D.   On: 06/20/2016 23:10   Ct Cervical Spine Wo Contrast  Result Date: 06/20/2016 CLINICAL DATA:  Fall striking face while using walker. No loss of consciousness. EXAM: CT HEAD WITHOUT CONTRAST CT MAXILLOFACIAL WITHOUT CONTRAST CT CERVICAL SPINE WITHOUT CONTRAST TECHNIQUE: Multidetector CT imaging of the head, cervical spine, and maxillofacial structures were performed using the standard protocol without intravenous contrast. Multiplanar CT image reconstructions of the cervical spine and maxillofacial structures were also generated. COMPARISON:  None. FINDINGS: CT HEAD FINDINGS Brain: No evidence of acute infarction, hemorrhage, hydrocephalus, extra-axial collection or mass lesion/mass effect. Vascular: No hyperdense vessel or unexpected calcification. Skull: No skull fracture.  Right frontal scalp hematoma. Other: None. CT MAXILLOFACIAL FINDINGS Osseous: No acute facial bone fracture. Possible remote nasal bone fracture. Zygomatic arches, mandibles, and pterygoid plates are intact. There is anterior subluxation of the a right temporomandibular joint. Orbits: No orbital fracture.  Both orbits and globes are intact. Sinuses: No fluid levels. Scattered mucosal thickening in the maxillary sinuses and ethmoid air cells. Soft tissues: Right frontal scalp hematoma. CT CERVICAL SPINE FINDINGS Alignment: Straightening of normal lordosis. No listhesis, jumped or perched facets. Lateral  masses of C1 are well aligned on C2. Skull base and vertebrae: No acute fracture. Dens and skullbase are intact. Anterior fusion C5 through C7 with intact hardware. Non fusion posterior elements of C1, a normal variant. Soft tissues and spinal canal: No prevertebral fluid or swelling. No visible canal hematoma. Disc levels: Disc spacers at C5-C6 and C6-C7. Minimal endplate spurring at C4-C5. Upper chest: No acute abnormality. Other: None. IMPRESSION: 1. Right frontal scalp hematoma. No skull fracture or acute intracranial abnormality. 2. No facial bone fracture. 3. Postsurgical change in the cervical spine with intact hardware. No fracture or acute subluxation. Electronically Signed   By: Rubye Oaks M.D.   On: 06/20/2016 23:10   Ct Maxillofacial Wo Contrast  Result Date: 06/20/2016 CLINICAL DATA:  Fall striking face while using walker. No loss of consciousness. EXAM: CT HEAD WITHOUT CONTRAST CT MAXILLOFACIAL WITHOUT CONTRAST CT CERVICAL SPINE WITHOUT CONTRAST TECHNIQUE: Multidetector CT imaging of the head, cervical spine, and maxillofacial structures  were performed using the standard protocol without intravenous contrast. Multiplanar CT image reconstructions of the cervical spine and maxillofacial structures were also generated. COMPARISON:  None. FINDINGS: CT HEAD FINDINGS Brain: No evidence of acute infarction, hemorrhage, hydrocephalus, extra-axial collection or mass lesion/mass effect. Vascular: No hyperdense vessel or unexpected calcification. Skull: No skull fracture.  Right frontal scalp hematoma. Other: None. CT MAXILLOFACIAL FINDINGS Osseous: No acute facial bone fracture. Possible remote nasal bone fracture. Zygomatic arches, mandibles, and pterygoid plates are intact. There is anterior subluxation of the a right temporomandibular joint. Orbits: No orbital fracture.  Both orbits and globes are intact. Sinuses: No fluid levels. Scattered mucosal thickening in the maxillary sinuses and ethmoid air  cells. Soft tissues: Right frontal scalp hematoma. CT CERVICAL SPINE FINDINGS Alignment: Straightening of normal lordosis. No listhesis, jumped or perched facets. Lateral masses of C1 are well aligned on C2. Skull base and vertebrae: No acute fracture. Dens and skullbase are intact. Anterior fusion C5 through C7 with intact hardware. Non fusion posterior elements of C1, a normal variant. Soft tissues and spinal canal: No prevertebral fluid or swelling. No visible canal hematoma. Disc levels: Disc spacers at C5-C6 and C6-C7. Minimal endplate spurring at C4-C5. Upper chest: No acute abnormality. Other: None. IMPRESSION: 1. Right frontal scalp hematoma. No skull fracture or acute intracranial abnormality. 2. No facial bone fracture. 3. Postsurgical change in the cervical spine with intact hardware. No fracture or acute subluxation. Electronically Signed   By: Rubye Oaks M.D.   On: 06/20/2016 23:10      Assessment & Plan:    Active Problems:   Fall   1. Mechanical fall- due to bilateral upper extremity weakness. CT maxillofacial showed right frontal scalp hematoma. CT cervical spine showed no acute changes. 2. Slurred speech-chronic, since surgery in February. 3. Bilateral upper extremity weakness- patient has had progressive weakness since surgery in February, ED physician spoke to neurology who will see the patient in a.m. for consult for possible demyelinating disorder.    DVT Prophylaxis-   Lovenox   AM Labs Ordered, also please review Full Orders  Family Communication: Admission, patients condition and plan of care including tests being ordered have been discussed with the patient and his wife at bedside who indicate understanding and agree with the plan and code Status.  Code Status:  Full code  Admission status: Observation  Time spent in minutes : 60 min   Onelia Cadmus S M.D on 06/21/2016 at 2:18 AM  Between 7am to 7pm - Pager - [phone]. After 7pm go to [email or web address] -  password [facility]  [facility] - Office  [phone]

## 2016-06-21 NOTE — Consult Note (Addendum)
[place] A. Merlene Laughter, MD     [email or web address]          Steve Koch is an 56 y.o. male.   ASSESSMENT/PLAN: Gait impairment due to cervical myelopathy. I recommend intense inpatient rehabilitation for gait improvement and improve strength. The patient likely will continue to fall and sustained injuries if he does not receive intensive physical therapy. Suggest EMG and NCS.  Head injury due to gait impairment.   Marked dysarthria of unclear etiology. The patient should have additional workup. The following labs will be obtained: Acetylcholine receptor antibodies, CPK and other labs.  Suggest EMG and NCS to rule ALS.  Vitamin B12 deficiency. This should be replaced.    The patient is a 56 year old white male who sustained a work-related injury in the latter part of 2017. The patient fell and injured his right upper extremity. The patient tells me that he does not recall specifically hit his head or neck. However, he reports hitting his entire body. About 2 weeks after this she developed progressive weakness of the right upper extremity. Unfortunately, he did not get appropriate imaging for a while. The patient's symptoms got progressively worse with the weakness of the right leg and left side. The patient had a lot of falls. He eventually underwent imaging sharing a large disc herniation. The wife did have copies of this and he had a lot of very large disc at C4-C5 causing severe spinal cord compression. He underwent surgery in February 2008. It appears that since his surgeries had significant dysarthria. His surgeon tells them that it is from his anterior approach of his procedure. The dysarthria seemed to be fluctuating with his symptoms seemingly worse when he is tired. Unfortunately, the patient continues to have frequent falls which resulted in the patient falling on yesterday and having head injury with bleeding resulted in the patient come to the emergency room.  The patient falls persist despite him using his walker. He has had mild improvement of the right upper extremity after surgery. Unfortunately, the legs which are weak preoperatively have not improved and he may have had some weakness that progressed involving the left upper extremity. The patient doesn't report having any incontinence of bladder or bowel. He does not report having significant difficulty with swallowing. No diplopia is reported. The review of systems was negative.   GENERAL: Pleasant and in no acute distress.  HEENT: Supple. Scalp hematoma and swelling involving the right forehead. Two places are noted.  ABDOMEN: soft  EXTREMITIES: No edema   BACK: Normal.  SKIN: Normal by inspection.    MENTAL STATUS: Alert and oriented. Speech - is moderately to severely dysarthric, language and cognition are generally intact. Judgment and insight normal.   CRANIAL NERVES: Pupils are equal, round and reactive to light and accommodation; extra ocular movements are full, there is no significant nystagmus; visual fields are full; upper and lower facial muscles are normal in strength and symmetric, there is no flattening of the nasolabial folds; tongue is midline; uvula is midline; shoulder elevation is normal.  MOTOR: Right deltoid 3/5, right triceps 3/5 and the hand movements 2/5. Left deltoid 3/5 and triceps 4/5 and the hand movements 2/5. Hip flexion bilaterally 5 and dorsiflexion 2/5. Tone is markedly increased in the legs and markedly increased in the upper extremities. Infrequent fasciculation Leg and L arm.  COORDINATION: Left finger to nose is normal, right finger to nose is normal, No rest tremor; no intention tremor; no postural tremor; no bradykinesia.  REFLEXES:  Deep tendon reflexes are symmetrical but brisk throughout in upper and lower extremities.   SENSATION: Normal to light touch.   Blood pressure 121/81, pulse 76, temperature 98.4 F (36.9 C), temperature source Oral,  resp. rate 20, height '6\' 1"'$  (1.854 m), weight 257 lb 11.5 oz (116.9 kg), SpO2 96 %.  Past Medical History:  Diagnosis Date  . Anxiety    situational with injury  . Carpal tunnel syndrome    right hand  . GERD (gastroesophageal reflux disease)   . Hypertension   . PONV (postoperative nausea and vomiting)     Past Surgical History:  Procedure Laterality Date  . ANTERIOR CERVICAL DECOMP/DISCECTOMY FUSION N/A 03/09/2016   Procedure: Anterior Cervical Discectomy Fusion Cervial 5-7;  Surgeon: Melina Schools, MD;  Location: [facility];  Service: Orthopedics;  Laterality: N/A;  Requests for 3.5 hrs  . BACK SURGERY    . COLONOSCOPY    . HAND SURGERY Left   . HERNIA REPAIR Right    inguinal  . KNEE ARTHROSCOPY Right     Family History  Problem Relation Age of Onset  . Pneumonia Mother   . Hypertension Mother   . Diabetes Father   . Cancer Father     Social History:  reports that he quit smoking about 10 years ago. He has never used smokeless tobacco. He reports that he drinks alcohol. He reports that he does not use drugs.  Allergies:  Allergies  Allergen Reactions  . Lyrica [Pregabalin] Swelling    Medications: Prior to Admission medications   Medication Sig Start Date End Date Taking? Authorizing Provider  amLODipine (NORVASC) 2.5 MG tablet Take 2.5 mg by mouth daily.  02/05/16  Yes [provider]  aspirin EC 81 MG tablet Take 81 mg by mouth daily.   Yes [provider]  ibuprofen (ADVIL,MOTRIN) 200 MG tablet Take 400 mg by mouth every 6 (six) hours as needed.   Yes [provider]  omeprazole (PRILOSEC) 20 MG capsule Take 20 mg by mouth daily.   Yes [provider]  oxymetazoline (AFRIN) 0.05 % nasal spray Place 1 spray into both nostrils 2 (two) times daily as needed for congestion.   Yes [provider]    Scheduled Meds: . amLODipine  2.5 mg Oral Daily  . aspirin EC  81 mg Oral Daily  . enoxaparin (LOVENOX) injection  40 mg  Subcutaneous Q24H  . pantoprazole  40 mg Oral Daily  . vitamin B-12  500 mcg Oral Daily   Continuous Infusions: . sodium chloride 10 mL/hr at 06/21/16 0353   PRN Meds:.ibuprofen, ondansetron **OR** ondansetron (ZOFRAN) IV     Results for orders placed or performed during the hospital encounter of 06/20/16 (from the past 48 hour(s))  CBC with Differential/Platelet     Status: Abnormal   Collection Time: 06/20/16 11:37 PM  Result Value Ref Range   WBC 12.1 (H) 4.0 - 10.5 K/uL   RBC 4.98 4.22 - 5.81 MIL/uL   Hemoglobin 15.3 13.0 - 17.0 g/dL   HCT 41.8 39.0 - 52.0 %   MCV 83.9 78.0 - 100.0 fL   MCH 30.7 26.0 - 34.0 pg   MCHC 36.3 (H) 30.0 - 36.0 g/dL   RDW 14.2 11.5 - 15.5 %   Platelets 247 150 - 400 K/uL   Neutrophils Relative % 72 %   Neutro Abs 8.7 (H) 1.7 - 7.7 K/uL   Lymphocytes Relative 20 %   Lymphs Abs 2.4 0.7 - 4.0 K/uL  Monocytes Relative 7 %   Monocytes Absolute 0.9 0.1 - 1.0 K/uL   Eosinophils Relative 1 %   Eosinophils Absolute 0.1 0.0 - 0.7 K/uL   Basophils Relative 0 %   Basophils Absolute 0.0 0.0 - 0.1 K/uL  Basic metabolic panel     Status: Abnormal   Collection Time: 06/20/16 11:37 PM  Result Value Ref Range   Sodium 139 135 - 145 mmol/L   Potassium 3.7 3.5 - 5.1 mmol/L   Chloride 105 101 - 111 mmol/L   CO2 25 22 - 32 mmol/L   Glucose, Bld 115 (H) 65 - 99 mg/dL   BUN 12 6 - 20 mg/dL   Creatinine, Ser 0.77 0.61 - 1.24 mg/dL   Calcium 9.2 8.9 - 10.3 mg/dL   GFR calc non Af Amer >60 >60 mL/min   GFR calc Af Amer >60 >60 mL/min    Comment: (NOTE) The eGFR has been calculated using the CKD EPI equation. This calculation has not been validated in all clinical situations. eGFR's persistently <60 mL/min signify possible Chronic Kidney Disease.    Anion gap 9 5 - 15  TSH     Status: None   Collection Time: 06/20/16 11:57 PM  Result Value Ref Range   TSH 1.758 0.350 - 4.500 uIU/mL    Comment: Performed by a 3rd Generation assay with a functional  sensitivity of <=0.01 uIU/mL.  CBC     Status: None   Collection Time: 06/21/16  5:28 AM  Result Value Ref Range   WBC 10.2 4.0 - 10.5 K/uL   RBC 4.97 4.22 - 5.81 MIL/uL   Hemoglobin 15.1 13.0 - 17.0 g/dL   HCT 41.9 39.0 - 52.0 %   MCV 84.3 78.0 - 100.0 fL   MCH 30.4 26.0 - 34.0 pg   MCHC 36.0 30.0 - 36.0 g/dL   RDW 14.2 11.5 - 15.5 %   Platelets 250 150 - 400 K/uL  Comprehensive metabolic panel     Status: None   Collection Time: 06/21/16  5:28 AM  Result Value Ref Range   Sodium 141 135 - 145 mmol/L   Potassium 4.5 3.5 - 5.1 mmol/L    Comment: DELTA CHECK NOTED   Chloride 107 101 - 111 mmol/L   CO2 28 22 - 32 mmol/L   Glucose, Bld 98 65 - 99 mg/dL   BUN 12 6 - 20 mg/dL   Creatinine, Ser 0.80 0.61 - 1.24 mg/dL   Calcium 9.5 8.9 - 10.3 mg/dL   Total Protein 6.9 6.5 - 8.1 g/dL   Albumin 4.0 3.5 - 5.0 g/dL   AST 18 15 - 41 U/L   ALT 21 17 - 63 U/L   Alkaline Phosphatase 69 38 - 126 U/L   Total Bilirubin 1.2 0.3 - 1.2 mg/dL   GFR calc non Af Amer >60 >60 mL/min   GFR calc Af Amer >60 >60 mL/min    Comment: (NOTE) The eGFR has been calculated using the CKD EPI equation. This calculation has not been validated in all clinical situations. eGFR's persistently <60 mL/min signify possible Chronic Kidney Disease.    Anion gap 6 5 - 15  Urinalysis, Complete w Microscopic     Status: Abnormal   Collection Time: 06/21/16  9:30 AM  Result Value Ref Range   Color, Urine STRAW (A) YELLOW   APPearance CLEAR CLEAR   Specific Gravity, Urine 1.006 1.005 - 1.030   pH 7.0 5.0 - 8.0   Glucose, UA NEGATIVE NEGATIVE  mg/dL   Hgb urine dipstick NEGATIVE NEGATIVE   Bilirubin Urine NEGATIVE NEGATIVE   Ketones, ur NEGATIVE NEGATIVE mg/dL   Protein, ur NEGATIVE NEGATIVE mg/dL   Nitrite NEGATIVE NEGATIVE   Leukocytes, UA NEGATIVE NEGATIVE   RBC / HPF NONE SEEN 0 - 5 RBC/hpf   WBC, UA NONE SEEN 0 - 5 WBC/hpf   Bacteria, UA RARE (A) NONE SEEN   Squamous Epithelial / LPF 0-5 (A) NONE SEEN    Mucous PRESENT   Vitamin B12     Status: None   Collection Time: 06/21/16  9:40 AM  Result Value Ref Range   Vitamin B-12 228 180 - 914 pg/mL    Comment: (NOTE) This assay is not validated for testing neonatal or myeloproliferative syndrome specimens for Vitamin B12 levels. Performed at [facility], [place] [address] [place], [place] [address]     Studies/Results:    CLINICAL DATA:  Fall striking face while using walker. No loss of consciousness.  EXAM: CT HEAD WITHOUT CONTRAST  CT MAXILLOFACIAL WITHOUT CONTRAST  CT CERVICAL SPINE WITHOUT CONTRAST  TECHNIQUE: Multidetector CT imaging of the head, cervical spine, and maxillofacial structures were performed using the standard protocol without intravenous contrast. Multiplanar CT image reconstructions of the cervical spine and maxillofacial structures were also generated.  COMPARISON:  None.  FINDINGS: CT HEAD FINDINGS  Brain: No evidence of acute infarction, hemorrhage, hydrocephalus, extra-axial collection or mass lesion/mass effect.  Vascular: No hyperdense vessel or unexpected calcification.  Skull: No skull fracture.  Right frontal scalp hematoma.  Other: None.  CT MAXILLOFACIAL FINDINGS  Osseous: No acute facial bone fracture. Possible remote nasal bone fracture. Zygomatic arches, mandibles, and pterygoid plates are intact. There is anterior subluxation of the a right temporomandibular joint.  Orbits: No orbital fracture.  Both orbits and globes are intact.  Sinuses: No fluid levels. Scattered mucosal thickening in the maxillary sinuses and ethmoid air cells.  Soft tissues: Right frontal scalp hematoma.  CT CERVICAL SPINE FINDINGS  Alignment: Straightening of normal lordosis. No listhesis, jumped or perched facets. Lateral masses of C1 are well aligned on C2.  Skull base and vertebrae: No acute fracture. Dens and skullbase are intact. Anterior fusion C5 through C7 with  intact hardware. Non fusion posterior elements of C1, a normal variant.  Soft tissues and spinal canal: No prevertebral fluid or swelling. No visible canal hematoma.  Disc levels: Disc spacers at C5-C6 and C6-C7. Minimal endplate spurring at U7-O5.  Upper chest: No acute abnormality.  Other: None.  IMPRESSION: 1. Right frontal scalp hematoma. No skull fracture or acute intracranial abnormality. 2. No facial bone fracture. 3. Postsurgical change in the cervical spine with intact hardware. No fracture or acute subluxation.    REPEAT C SPINE MRI 06/16/2016 FINDINGS: . Bones:Vertebral body heights are maintained. No marrow edema or focal bone lesion . Spinal cord: Normal size, contour, and signal without evidence of mass or demyelinating lesion . Alignment: Anatomic without subluxation. . Degenerative changes:  . C1-C2: No significant central canal or neural foraminal stenosis. . C2-C3: No significant central canal or neural foraminal stenosis. . C3-C4: No significant central canal or neural foraminal stenosis. Marland Kitchen C4-C5: No significant central canal or neural foraminal stenosis. Marland Kitchen C5-C6: Status post ACDF without hardware failure. There are right posterior paracentral osteophytes abutting the right ventral spinal cord without definite cord signal abnormality. . C6-C7: Status post ACDF without hardware failure or stenosis . C7-T1: No significant central canal or neural foraminal stenosis. . T2-T3: left  paracentral focal disc protrusion . Additional findings: None. No abnormal enhancement         Tayjah Lobdell A. Merlene Laughter, M.D.  Diplomate, Tax adviser of Psychiatry and Neurology ( Neurology). 06/21/2016, 5:53 PM

## 2016-06-21 NOTE — Progress Notes (Signed)
Inpatient Rehabilitation  I received a call from Horald ChestnutJessica Childers, [facility] inquiring as to whether Workers Comp would cover an IP  Rehab stay for this pt. if indicated once a workup has been completed.  We would have to seek approval from Workers Comp if it is recommended for pt. to come to [facility].  Currently, his neuro consult and therapy evaluations are pending .  Plan will be for Fae PippinMelissa Bowie to follow along in my absence Th/Fri to assist in dispo planning once work up is completed.  Please call if questions.  Weldon PickingSusan Ichelle Harral PT Inpatient Rehab Admissions Coordinator Cell [phone] Office [phone]

## 2016-06-22 DIAGNOSIS — R2681 Unsteadiness on feet: Secondary | ICD-10-CM | POA: Diagnosis not present

## 2016-06-22 DIAGNOSIS — R209 Unspecified disturbances of skin sensation: Secondary | ICD-10-CM | POA: Diagnosis not present

## 2016-06-22 DIAGNOSIS — I10 Essential (primary) hypertension: Secondary | ICD-10-CM | POA: Diagnosis not present

## 2016-06-22 DIAGNOSIS — G959 Disease of spinal cord, unspecified: Secondary | ICD-10-CM | POA: Diagnosis not present

## 2016-06-22 LAB — CK TOTAL AND CKMB (NOT AT ARMC)
CK TOTAL: 126 U/L (ref 49–397)
CK, MB: 4.3 ng/mL (ref 0.5–5.0)
RELATIVE INDEX: 3.4 — AB (ref 0.0–2.5)

## 2016-06-22 LAB — URINE CULTURE: Culture: NO GROWTH

## 2016-06-22 LAB — HIV ANTIBODY (ROUTINE TESTING W REFLEX): HIV Screen 4th Generation wRfx: NONREACTIVE

## 2016-06-22 MED ORDER — BACLOFEN 10 MG PO TABS
10.0000 mg | ORAL_TABLET | Freq: Three times a day (TID) | ORAL | Status: DC
  Administered 2016-06-22 – 2016-06-23 (×2): 10 mg via ORAL
  Filled 2016-06-22 (×2): qty 1

## 2016-06-22 NOTE — Progress Notes (Signed)
PROGRESS NOTE  Steve Koch WUJ:[id number] DOB: 04/18/1960 DOA: 06/20/2016 PCP: [facility]  Brief History:  56 year old male with a history of hypertension and GERD presents with R>L upper extremity weakness and bilateral lower extremity weakness.  The patient had a work-related accident when he fell off his [employer] truck in October 2017 resulting in cervical myelopathy from herniated nucleus pulposus. The patient was taken to surgery ( [facility]) on 03/09/2016 performed by Dr. Venita Lick. Since the surgery, the patient states that his upper extremity and lower extremity weakness have been fairly stable without significant improvement. He has had numerous falls, nine according to the patient, since the surgery. He relates that many of his falls have been attributed to his upper extremity weakness as well as "clumsy legs".  He denies any bowel or bladder incontinence. He denies any neck or lower back pain. He denies any fevers, chills, chest pain, shortness breath, nausea, vomiting, diarrhea, abdominal pain, dysuria. Because of his progressive falls, the patient presented to the hospital and neurology consultation was obtained  Assessment/Plan: Cervical myelopathy with upper and lower extremity weakness -03/09/2016--anterior cervical discectomy fusion--C5-7--Dr. Venita Lick -neurology consult appreciated--believes his weakness is due to cervical myelopathy -apparently had abnormal Nerve Conduction study 06/20/16 as expected -PT and OT eval--pt would benefit from aggressive inpatient rehab -06/16/2016 MRI cervical spine--persistent abutment of osteophyte of right ventral spinal cord C5-6 without spinal cord abnormal signal--no other stenosis, no hardware failure -06/20/2016 CT cervical spine--no fracture, subluxation, prevertebral hematoma -case discussed with Anette Riedel, PA-C from Dr. Shon Baton' office--no acute surgical issues presently  Essential  hypertension -continue amlodipine  Frequent falls with scalp hematoma/Gait Instability -PT/OT---pt would benefit from aggressive inpatient rehab -06/20/2016 CT maxillofacial--no skull fracture, positive right scalp hematoma -check UA--no pyuria -check B12--228-->start supplementation -check TSH--1.758  GERD -continue PPI  Sensory disturbance -Numbness and tingling in the bilateral great toes - neurology consult appreciated -low B12--start supplementation -HIV neg    Disposition Plan:   CIR if insurance approves--patient refuses SNF Family Communication:   Spouse updated at bedside   Consultants:  neurology  Code Status:  FULL   DVT Prophylaxis:   [place] Lovenox   Procedures: As Listed in Progress Note Above  Antibiotics: None       Subjective: Patient feels that his legs a lot stronger than yesterday. He denies any fevers, chills, chest pain, short of breath, nausea, vomiting, diarrhea, abdominal pain, dysuria, hematuria.  Objective: Vitals:   06/21/16 1125 06/21/16 1420 06/21/16 2222 06/22/16 0500  BP: 115/64 121/81 111/69 109/60  Pulse: 76 76 66 60  Resp: 16 20 20 20   Temp: 98.4 F (36.9 C) 98.4 F (36.9 C) 98.3 F (36.8 C) 97.8 F (36.6 C)  TempSrc: Oral Oral Oral Oral  SpO2: 96% 96% 99% 98%  Weight:      Height:        Intake/Output Summary (Last 24 hours) at 06/22/16 1147 Last data filed at 06/21/16 1900  Gross per 24 hour  Intake              240 ml  Output                4 ml  Net              236 ml   Weight change:  Exam:   General:  Pt is alert, follows commands appropriately, not in acute distress  HEENT: No icterus, No thrush, No  neck mass, [place]/AT  Cardiovascular: RRR, S1/S2, no rubs, no gallops  Respiratory: CTA bilaterally, no wheezing, no crackles, no rhonchi  Abdomen: Soft/+BS, non tender, non distended, no guarding  Extremities: No edema, No lymphangitis, No petechiae, No rashes, no synovitis   Data  Reviewed: I have personally reviewed following labs and imaging studies Basic Metabolic Panel:  Recent Labs Lab 06/20/16 2337 06/21/16 0528  NA 139 141  K 3.7 4.5  CL 105 107  CO2 25 28  GLUCOSE 115* 98  BUN 12 12  CREATININE 0.77 0.80  CALCIUM 9.2 9.5   Liver Function Tests:  Recent Labs Lab 06/21/16 0528  AST 18  ALT 21  ALKPHOS 69  BILITOT 1.2  PROT 6.9  ALBUMIN 4.0   No results for input(s): LIPASE, AMYLASE in the last 168 hours. No results for input(s): AMMONIA in the last 168 hours. Coagulation Profile: No results for input(s): INR, PROTIME in the last 168 hours. CBC:  Recent Labs Lab 06/20/16 2337 06/21/16 0528  WBC 12.1* 10.2  NEUTROABS 8.7*  --   HGB 15.3 15.1  HCT 41.8 41.9  MCV 83.9 84.3  PLT 247 250   Cardiac Enzymes: No results for input(s): CKTOTAL, CKMB, CKMBINDEX, TROPONINI in the last 168 hours. BNP: Invalid input(s): POCBNP CBG: No results for input(s): GLUCAP in the last 168 hours. HbA1C: No results for input(s): HGBA1C in the last 72 hours. Urine analysis:    Component Value Date/Time   COLORURINE STRAW (A) 06/21/2016 0930   APPEARANCEUR CLEAR 06/21/2016 0930   LABSPEC 1.006 06/21/2016 0930   PHURINE 7.0 06/21/2016 0930   GLUCOSEU NEGATIVE 06/21/2016 0930   HGBUR NEGATIVE 06/21/2016 0930   BILIRUBINUR NEGATIVE 06/21/2016 0930   KETONESUR NEGATIVE 06/21/2016 0930   PROTEINUR NEGATIVE 06/21/2016 0930   NITRITE NEGATIVE 06/21/2016 0930   LEUKOCYTESUR NEGATIVE 06/21/2016 0930   Sepsis Labs: @LABRCNTIP (procalcitonin:4,lacticidven:4) ) Recent Results (from the past 240 hour(s))  Culture, Urine     Status: None   Collection Time: 06/21/16  9:30 AM  Result Value Ref Range Status   Specimen Description URINE, CLEAN CATCH  Final   Special Requests NONE  Final   Culture   Final    NO GROWTH Performed at [facility], [address] [place], [place] [address]    Report Status 06/22/2016 FINAL  Final     Scheduled  Meds: . amLODipine  2.5 mg Oral Daily  . aspirin EC  81 mg Oral Daily  . cyanocobalamin  1,000 mcg Intramuscular Q2000  . enoxaparin (LOVENOX) injection  40 mg Subcutaneous Q24H  . pantoprazole  40 mg Oral Daily  . vitamin B-12  500 mcg Oral Daily   Continuous Infusions: . sodium chloride 10 mL/hr at 06/21/16 0353    Procedures/Studies: Ct Head Wo Contrast  Result Date: 06/20/2016 CLINICAL DATA:  Fall striking face while using walker. No loss of consciousness. EXAM: CT HEAD WITHOUT CONTRAST CT MAXILLOFACIAL WITHOUT CONTRAST CT CERVICAL SPINE WITHOUT CONTRAST TECHNIQUE: Multidetector CT imaging of the head, cervical spine, and maxillofacial structures were performed using the standard protocol without intravenous contrast. Multiplanar CT image reconstructions of the cervical spine and maxillofacial structures were also generated. COMPARISON:  None. FINDINGS: CT HEAD FINDINGS Brain: No evidence of acute infarction, hemorrhage, hydrocephalus, extra-axial collection or mass lesion/mass effect. Vascular: No hyperdense vessel or unexpected calcification. Skull: No skull fracture.  Right frontal scalp hematoma. Other: None. CT MAXILLOFACIAL FINDINGS Osseous: No acute facial bone fracture. Possible remote nasal bone fracture. Zygomatic  arches, mandibles, and pterygoid plates are intact. There is anterior subluxation of the a right temporomandibular joint. Orbits: No orbital fracture.  Both orbits and globes are intact. Sinuses: No fluid levels. Scattered mucosal thickening in the maxillary sinuses and ethmoid air cells. Soft tissues: Right frontal scalp hematoma. CT CERVICAL SPINE FINDINGS Alignment: Straightening of normal lordosis. No listhesis, jumped or perched facets. Lateral masses of C1 are well aligned on C2. Skull base and vertebrae: No acute fracture. Dens and skullbase are intact. Anterior fusion C5 through C7 with intact hardware. Non fusion posterior elements of C1, a normal variant. Soft tissues  and spinal canal: No prevertebral fluid or swelling. No visible canal hematoma. Disc levels: Disc spacers at C5-C6 and C6-C7. Minimal endplate spurring at C4-C5. Upper chest: No acute abnormality. Other: None. IMPRESSION: 1. Right frontal scalp hematoma. No skull fracture or acute intracranial abnormality. 2. No facial bone fracture. 3. Postsurgical change in the cervical spine with intact hardware. No fracture or acute subluxation. Electronically Signed   By: Rubye Oaks M.D.   On: 06/20/2016 23:10   Ct Cervical Spine Wo Contrast  Result Date: 06/20/2016 CLINICAL DATA:  Fall striking face while using walker. No loss of consciousness. EXAM: CT HEAD WITHOUT CONTRAST CT MAXILLOFACIAL WITHOUT CONTRAST CT CERVICAL SPINE WITHOUT CONTRAST TECHNIQUE: Multidetector CT imaging of the head, cervical spine, and maxillofacial structures were performed using the standard protocol without intravenous contrast. Multiplanar CT image reconstructions of the cervical spine and maxillofacial structures were also generated. COMPARISON:  None. FINDINGS: CT HEAD FINDINGS Brain: No evidence of acute infarction, hemorrhage, hydrocephalus, extra-axial collection or mass lesion/mass effect. Vascular: No hyperdense vessel or unexpected calcification. Skull: No skull fracture.  Right frontal scalp hematoma. Other: None. CT MAXILLOFACIAL FINDINGS Osseous: No acute facial bone fracture. Possible remote nasal bone fracture. Zygomatic arches, mandibles, and pterygoid plates are intact. There is anterior subluxation of the a right temporomandibular joint. Orbits: No orbital fracture.  Both orbits and globes are intact. Sinuses: No fluid levels. Scattered mucosal thickening in the maxillary sinuses and ethmoid air cells. Soft tissues: Right frontal scalp hematoma. CT CERVICAL SPINE FINDINGS Alignment: Straightening of normal lordosis. No listhesis, jumped or perched facets. Lateral masses of C1 are well aligned on C2. Skull base and  vertebrae: No acute fracture. Dens and skullbase are intact. Anterior fusion C5 through C7 with intact hardware. Non fusion posterior elements of C1, a normal variant. Soft tissues and spinal canal: No prevertebral fluid or swelling. No visible canal hematoma. Disc levels: Disc spacers at C5-C6 and C6-C7. Minimal endplate spurring at C4-C5. Upper chest: No acute abnormality. Other: None. IMPRESSION: 1. Right frontal scalp hematoma. No skull fracture or acute intracranial abnormality. 2. No facial bone fracture. 3. Postsurgical change in the cervical spine with intact hardware. No fracture or acute subluxation. Electronically Signed   By: Rubye Oaks M.D.   On: 06/20/2016 23:10   Ct Maxillofacial Wo Contrast  Result Date: 06/20/2016 CLINICAL DATA:  Fall striking face while using walker. No loss of consciousness. EXAM: CT HEAD WITHOUT CONTRAST CT MAXILLOFACIAL WITHOUT CONTRAST CT CERVICAL SPINE WITHOUT CONTRAST TECHNIQUE: Multidetector CT imaging of the head, cervical spine, and maxillofacial structures were performed using the standard protocol without intravenous contrast. Multiplanar CT image reconstructions of the cervical spine and maxillofacial structures were also generated. COMPARISON:  None. FINDINGS: CT HEAD FINDINGS Brain: No evidence of acute infarction, hemorrhage, hydrocephalus, extra-axial collection or mass lesion/mass effect. Vascular: No hyperdense vessel or unexpected calcification. Skull: No skull fracture.  Right frontal scalp hematoma. Other: None. CT MAXILLOFACIAL FINDINGS Osseous: No acute facial bone fracture. Possible remote nasal bone fracture. Zygomatic arches, mandibles, and pterygoid plates are intact. There is anterior subluxation of the a right temporomandibular joint. Orbits: No orbital fracture.  Both orbits and globes are intact. Sinuses: No fluid levels. Scattered mucosal thickening in the maxillary sinuses and ethmoid air cells. Soft tissues: Right frontal scalp hematoma.  CT CERVICAL SPINE FINDINGS Alignment: Straightening of normal lordosis. No listhesis, jumped or perched facets. Lateral masses of C1 are well aligned on C2. Skull base and vertebrae: No acute fracture. Dens and skullbase are intact. Anterior fusion C5 through C7 with intact hardware. Non fusion posterior elements of C1, a normal variant. Soft tissues and spinal canal: No prevertebral fluid or swelling. No visible canal hematoma. Disc levels: Disc spacers at C5-C6 and C6-C7. Minimal endplate spurring at C4-C5. Upper chest: No acute abnormality. Other: None. IMPRESSION: 1. Right frontal scalp hematoma. No skull fracture or acute intracranial abnormality. 2. No facial bone fracture. 3. Postsurgical change in the cervical spine with intact hardware. No fracture or acute subluxation. Electronically Signed   By: Rubye Oaks M.D.   On: 06/20/2016 23:10    Coy Rochford, DO  [facility] Pager [phone]  If 7PM-7AM, please contact night-coverage [email or web address] Password [email or web address] 06/22/2016, 11:47 AM   LOS: 0 days

## 2016-06-22 NOTE — Evaluation (Signed)
Occupational Therapy Evaluation Patient Details Name: Steve Koch MRN: [id number] DOB: Jul 10, 1960 Today's Date: 06/22/2016    History of Present Illness 56 yo male with onset of injury to neck at C5-7 with fusion (03-09-16) after falling backward on a loading dock at work Oct 2017, now admitted with ninth fall since surgery.  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  He is at work with wife when not at therapy during the day.   Clinical Impression   Pt received semi-reclined in bed, wife present, agreeable to evaluation. Pt reports he has had 9 falls recently and has progressively declined in function despite attending therapy at [facility] and completing exercises daily at home. Pt is very motivated and wife is very supportive, has been assisting pt with ADLs and exercise completion; pt goes to work with wife as he is not safe to be at home alone. During evaluation pt is able to assist with ADL completion using LUE, however requires heavy assistance for standing tasks due to balance and strength deficits in BUE and BLE (see below for specifics). Pt does well with sit-to-stand with 2 person min assist for balance, increased time required for transfers. Pt had one posterior LOB during stand-pivot transfer to chair, OT providing mod assist to maintain standing, pt able to correct with increased time and verbal cuing for sequencing.   Highly recommend CIR on discharge to focus on improving independence in B/IADL completion, functional mobility, and transfer tasks. Pt is young and highly motivated to regain function and independence, as well as has past history of good compliance with rehab services.    Follow Up Recommendations  CIR    Equipment Recommendations  None recommended by OT       Precautions / Restrictions Precautions Precautions: Fall;Cervical Precaution Comments: No brace  but still protective mobility Restrictions Weight Bearing Restrictions: No      Mobility Bed Mobility Overal bed mobility: Needs Assistance Bed Mobility: Supine to Sit     Supine to sit: Max assist;+2 for physical assistance;+2 for safety/equipment;HOB elevated     General bed mobility comments: Pt requires increased time for processing and sequencing, fair sitting balance while at EOB  Transfers Overall transfer level: Needs assistance Equipment used: Rolling walker (2 wheeled) Transfers: Sit to/from [employer] Sit to Stand: Min assist;+2 physical assistance;From elevated surface Stand pivot transfers: Min assist;+2 physical assistance;+2 safety/equipment       General transfer comment: Pt rocks forward and counts to 3 to prepare for standing, on 3 is able to power up very well with min guard/min assist from OT and wife        ADL either performed or assessed with clinical judgement   ADL Overall ADL's : Needs assistance/impaired Eating/Feeding: Set up;Minimal assistance;Sitting Eating/Feeding Details (indicate cue type and reason): Pt uses non-dominant left hand to eat due to RUE deficits. Wife assists if pt fatigued Grooming: Wash/dry face;Oral care;Set up;Minimal assistance;Sitting Grooming Details (indicate cue type and reason): Pt able to complete grooming tasks with non-dominant LUE with min assistance for grasping utensils and for control Upper Body Bathing: Maximal assistance;Sitting Upper Body Bathing Details (indicate cue type and reason): Pt requires max assist for bathing tasks due to BUE strength and coordination deficits and balance deficits Lower Body Bathing: Total assistance;Sitting/lateral leans Lower Body Bathing Details (indicate cue type and reason): Pt requires total assistance for LB bathing due to poor  balance and BUE deficits Upper Body Dressing : Maximal assistance;Sitting Upper Body Dressing Details (indicate cue type and reason):  Due to BUE strength and coordination deficits  Lower Body Dressing: Moderate assistance;Sitting/lateral leans;Total assistance;Sit to/from stand Lower Body Dressing Details (indicate cue type and reason): Pt able to reach down and pull pants up to knees with LUE while seated on BSC. Upon standing requires total assist for LB dressing. Pt able to push LE's into shoes when feet positioned into shoe Toilet Transfer: Moderate assistance;+2 for physical assistance;Cueing for safety;Cueing for sequencing;Stand-pivot;BSC Toilet Transfer Details (indicate cue type and reason): Pt able to perform stand-pivot to [facility] with increased time, taking small side-steps to the left. Pt has max difficulty lifting RUE up to take a step. Pt able to reach back for handle of BSC with LUE Toileting- Clothing Manipulation and Hygiene: Maximal assistance;Sitting/lateral lean;Total assistance;Sit to/from stand Toileting - Clothing Manipulation Details (indicate cue type and reason): Pt able to assist with LB clothing while seated at [facility], requires total assistance for hygiene tasks Tub/ Shower Transfer: Maximal assistance;Cueing for sequencing;Shower Dealer Details (indicate cue type and reason): Wife reports pt is very anxious about stepping into shower, requires max assist and increased time for mobility into shower Functional mobility during ADLs: Minimal assistance;+2 for physical assistance;Cueing for safety;Cueing for sequencing;Rolling walker General ADL Comments: Pt participation in ADL tasks has been declining over past few weeks due to strength and coordination deficits in BUE and BLE, as well as balance deficits     Vision Baseline Vision/History: No visual deficits Patient Visual Report: No change from baseline Vision Assessment?: No apparent visual deficits            Pertinent Vitals/Pain Pain Assessment: No/denies pain     Hand Dominance Right   Extremity/Trunk Assessment  Upper Extremity Assessment Upper Extremity Assessment: RUE deficits/detail;LUE deficits/detail RUE Deficits / Details: Strength deficits: shoulder strength 1/5, elbow 2+/5, wrist 0/5, minimal grip strength in hand. P/ROM is WNL. Pt has dynamic splint for flexion/extension of right hand and digits.  RUE Coordination: decreased fine motor;decreased gross motor LUE Deficits / Details: Strength deficits: shoulder 3+/5, elbow 3+/5, wrist 3/5, fair grip strength in hand. P/ROM is WNL, A/ROM is [facility] Increased tone noted  LUE Coordination: decreased fine motor;decreased gross motor   Lower Extremity Assessment Lower Extremity Assessment: Defer to PT evaluation   Cervical / Trunk Assessment Cervical / Trunk Assessment: Other exceptions Cervical / Trunk Exceptions: C5-C7 fusion 02/2016   Communication Communication Communication: No difficulties (slurred speech-worse when fatigued)   Cognition Arousal/Alertness: Awake/alert Behavior During Therapy: WFL for tasks assessed/performed Overall Cognitive Status: Within Functional Limits for tasks assessed                                                Home Living   Living Arrangements: Spouse/significant other Available Help at Discharge: Family;Available 24 hours/day Type of Home: House Home Access: Stairs to enter [employer] of Steps: 4 Entrance Stairs-Rails: None Home Layout: One level     Bathroom Shower/Tub: Producer, television/film/video: Standard     Home Equipment: Environmental consultant - 2 wheels;Cane - single point;Shower seat;Grab bars - toilet;Grab bars - tub/shower          Prior Functioning/Environment Level of Independence: Needs assistance  Gait / Transfers Assistance Needed: Wife assists with functional mobility  using RW, stands behind pt or to pt's right side (weaker side) ADL's / Homemaking Assistance Needed: Wife is assisting pt with B/IADL tasks and bed mobility, has been providing increased level  of assistance for past 2-3 weeks            OT Problem List: Decreased strength;Decreased range of motion;Decreased activity tolerance;Impaired balance (sitting and/or standing);Decreased coordination;Decreased safety awareness;Decreased knowledge of use of DME or AE;Impaired tone;Impaired UE functional use      OT Treatment/Interventions: Self-care/ADL training;Therapeutic exercise;Neuromuscular education;DME and/or AE instruction;Therapeutic activities;Patient/family education    OT Goals(Current goals can be found in the care plan section) Acute Rehab OT Goals Patient Stated Goal: To regain strength and be independent again OT Goal Formulation: With patient Time For Goal Achievement: 07/06/16 Potential to Achieve Goals: Good  OT Frequency: Min 2X/week    AM-PAC PT "6 Clicks" Daily Activity     Outcome Measure Help from another person eating meals?: A Lot Help from another person taking care of personal grooming?: A Lot Help from another person toileting, which includes using toliet, bedpan, or urinal?: Total Help from another person bathing (including washing, rinsing, drying)?: Total Help from another person to put on and taking off regular upper body clothing?: A Lot Help from another person to put on and taking off regular lower body clothing?: A Lot 6 Click Score: 10   End of Session Equipment Utilized During Treatment: Gait belt;Rolling walker  Activity Tolerance: Patient tolerated treatment well Patient left: in chair;with call bell/phone within reach;with family/visitor present  OT Visit Diagnosis: Repeated falls (R29.6);Muscle weakness (generalized) (M62.81);Other symptoms and signs involving the nervous system (R29.898)                Time: 0806-0900 OT Time Calculation (min): 54 min Charges:  OT General Charges $OT Visit: 1 Procedure OT Evaluation $OT Eval Moderate Complexity: 1 Procedure G-Codes: OT G-codes **NOT FOR INPATIENT CLASS** Functional Assessment  Tool Used: AM-PAC 6 Clicks Daily Activity Functional Limitation: Self care Self Care Current Status ([id number]): At least 60 percent but less than 80 percent impaired, limited or restricted Self Care Goal Status (N0272(G8988): At least 60 percent but less than 80 percent impaired, limited or restricted    Ezra SitesLeslie Joseandres Mazer, OTR/L  [phone] 06/22/2016, 9:28 AM

## 2016-06-22 NOTE — PMR Pre-admission (Signed)
Secondary Market PMR Admission Coordinator Pre-Admission Assessment  Patient: Steve Koch is an 56 y.o., male MRN: [id number] DOB: Dec 12, 1960 Height: 6\' 1"  (185.4 cm) Weight: 116.9 kg (257 lb 11.5 oz)  Insurance Information HMO:     PPO:      PCP:      IPA:      80/20:      OTHER: Blue Opt Silver with [employer]. PRIMARY: BCBS      Policy#: [id number]      Subscriber: Spouse CM Name: Ollen Gross      Phone#: [phone]     Fax#: [phone] Pre-Cert#: [id number]      Employer: Spouse Benefits:  Phone #: [phone]     Name: verified online 06/22/16 at 1200 Eff. Date: 10/17/15     Deduct: $2,000      Out of Pocket Max: $7,150      Life Max: N/A CIR: 70%/30%      SNF: 70%/30% Outpatient: 30 SLP visits, 30 PT/OT combined visits      Co-Pay: $90 per visit  Home Health: Following pre-authorization 70%      Co-Pay: 30% DME: 70%     Co-Pay: 30% Providers: in network   Medicaid Application Date:       Case Manager:  Disability Application Date:       Case Worker:   Emergency Conservator, museum/gallery Information    Name Relation Home Work Mobile   Bardwell,Dawn Spouse [phone]        Current Medical History  Patient Admitting Diagnosis: Gait impairment due to cervical myelopathy   History of Present Illness: [name] a 56 y.o.male with history of GERD, fall at work with RUE> LUE weakness due to 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 He underwent ACDF C5-C7 02/2016 and has had progressive weakness BLE.  MRI 6/1 done revealing stable ACDF with persistent abutment to right ventral cord due to osteophytes C5/6, no definite cord signal abnormality and T2-T3 left focal paracentral disc protrusion. He has had difficulty walking and sustained a fall and struck his head and was admitted on [facility] 06/20/16 for work up. CT head negative. Mild dysarthria of questionable etiology. Neurology recommended EMG/NCS for work up.     Patient's medical record from [facility] has been reviewed by the rehabilitation admission coordinator and physician.   Past Medical History  Past Medical History:  Diagnosis Date  . Anxiety    situational with injury  . Carpal tunnel syndrome    right hand  . GERD (gastroesophageal reflux disease)   . Hypertension   . PONV (postoperative nausea and vomiting)     Family History   family history includes Cancer in his father; Diabetes in his father; Hypertension in his mother; Pneumonia in his mother.  Prior Rehab/Hospitalizations Has the patient had major surgery during 100 days prior to admission? Yes    Current Medications Please refer to AP MAR for full details Norvasc Asprin  Lioreseal Vitamin B-12 tablet and injection  Lovenox Motrin Ativan Zofran  Protonix  Baclofen  Patients Current Diet:  Regular textures and thin liquids   Precautions / Restrictions Precautions Precautions: Fall, Cervical Precaution Comments: No brace but still protective mobility Restrictions Weight Bearing Restrictions: No   Has the patient had 2 or more falls or a fall with injury in the past year?Yes  Prior Activity Level Community (5-7x/wk): Prior to admission paitent went out of the house daily.  He was participating in outpatient  therapy at [facility].  He went to work with his wife daily so that she could care for him as well.  Patient with multiple falls and a gradual decline; however, prior to fall in 2017 he was fully independent and working full time.    Prior Functional Level Self Care: Did the patient need help bathing, dressing, using the toilet or eating?  Needed some help  Indoor Mobility: Did the patient need assistance with walking from room to room (with or without device)? Needed some help  Stairs: Did the patient need assistance with internal or external stairs (with or without device)? Needed some help  Functional Cognition: Did the  patient need help planning regular tasks such as shopping or remembering to take medications? Needed some help  Home Assistive Devices / Equipment Home Assistive Devices/Equipment: Dan Humphreys (specify type) Home Equipment: Walker - 2 wheels, Cane - single point, [employer], Grab bars - toilet, Grab bars - tub/shower  Prior Device Use: Indicate devices/aids used by the patient prior to current illness, exacerbation or injury? Ephraim Hamburger   Prior Functional Level Current Functional Level  Bed Mobility  Mod assist  Max assist   Transfers  Min assist  Min +2 assist    Mobility - Walk/Wheelchair  Min assist  Mod +2 assist    Upper Body Dressing  Mod assist  Max assist    Lower Body Dressing  Max assist  Mod-Total assist   Grooming  Min assist   Min assist    Eating/Drinking  Min assist   Min assist    Toilet Transfer  Min assist  Mod assist +2   Bladder Continence   Yes  Yes,urgency resulting in incontinence x1    Bowel Management  Continent   Continent 06/22/16   Stair Climbing  Min assist   Total assist    Communication  Per spouse, slurred speech when he is fatigued   No evaluation on record to assess for dysarthria    Memory  Independent   At baseline per spouse's report    Cooking/Meal Prep  Total assist       Housework  Total assist     Money Management  Spouse has always done this    Driving  No dependent on spouse      Special needs/care consideration BiPAP/CPAP: No CPM: No Continuous Drip IV: No Dialysis: No         Life Vest: No Oxygen: No Special Bed: No Trach Size: No Wound Vac (area): No       Skin: Abrasion to nose and Ecchymosis to right and left side of face                         Bowel mgmt: 06/22/16 Continent  Bladder mgmt: Continent  Diabetic mgmt: No  Previous Home Environment Living Arrangements: Spouse/significant other Available Help at Discharge: Family, Available 24 hours/day Type of Home: House Home Layout:  One level Home Access: Stairs to enter Entrance Stairs-Rails: None Secretary/administrator of Steps: 4 Bathroom Shower/Tub: Health visitor: Pharmacist, community: Yes Home Care Services: No  Discharge Living Setting Plans for Discharge Living Setting: Patient's home, Lives with (comment) (spouse) Type of Home at Discharge: House Discharge Home Layout: One level Discharge Home Access: Ramped entrance Discharge Bathroom Shower/Tub: Walk-in shower Discharge Bathroom Toilet: Standard Discharge Bathroom Accessibility: Yes How Accessible: Accessible via wheelchair, Accessible via walker (per wife's report ) Does the patient have any problems  obtaining your medications?: No  Social/Family/Support Systems Patient Roles: Spouse Contact Information: Spouse Ezariah Nace [phone] Anticipated Caregiver: Spouse Anticipated Caregiver's Contact Information: See above  Ability/Limitations of Caregiver: None Caregiver Availability: 24/7 Discharge Plan Discussed with Primary Caregiver: Yes Is Caregiver In Agreement with Plan?: Yes Does Caregiver/Family have Issues with Lodging/Transportation while Pt is in Rehab?: Yes (Home will be a good distance from CIR)  Goals/Additional Needs Patient/Family Goal for Rehab: PT/OT Supervision-Min A; SLP Supervision  Expected length of stay: 10-14 days Cultural Considerations: None Dietary Needs: Regular textures and thin liquids  Equipment Needs: TBD Special Service Needs: Patient was getting therapy at [facility] prior to admission Additional Information: Patient and spouse attribute decline and deficits to initial fall at work in 2017; however, they denied IP Rehab, therefore their insurance is covering this stay and they have an attorney assisting with fighting workers comp claim Pt/Family Agrees to Admission and willing to participate: Yes Program Orientation Provided & Reviewed with Pt/Caregiver Including Roles   & Responsibilities: Yes Additional Information Needs: Neuro at [facility] has recommeded that patient recieve studies as an outpatient:  Information Needs to be Provided By: Team FYI  Patient Condition: I have reviewed patient's medical record and discussed case with patient and spouse over the phone.  They are eager to get inpatient rehab therapies to facilitate his safe transition home by reducing his fall risk.  Given that prior to 2017 patient was fully independent and working this decline and 9 reported falls has concerned them.  Patient requires daily medical management, 24/7 nursing care, and skilled intensive therapies in order to regain his independence and safely discharge home with his spouse's support.    Preadmission Screen Completed By:  Fae Pippin, 06/23/2016 10:23 AM ______________________________________________________________________   Discussed status with Dr. Riley Kill on 06/23/16 at 3pm and received telephone approval for admission today.  Admission Coordinator:  Fae Pippin, time 06/23/16/Date 3:23pm   Assessment/Plan: Diagnosis: gait disorder due to cervical myelopathy 1. Does the need for close, 24 hr/day  Medical supervision in concert with the patient's rehab needs make it unreasonable for this patient to be served in a less intensive setting? Yes 2. Co-Morbidities requiring supervision/potential complications: pain, GERD, CTS 3. Due to bladder management, bowel management, safety, skin/wound care, disease management, medication administration, pain management and patient education, does the patient require 24 hr/day rehab nursing? Yes 4. Does the patient require coordinated care of a physician, rehab nurse, PT (1-2 hrs/day, 5 days/week) and OT (1-2 hrs/day, 5 days/week) to address physical and functional deficits in the context of the above medical diagnosis(es)? Yes Addressing deficits in the following areas: balance, endurance, locomotion, strength, transferring,  bowel/bladder control, bathing, dressing, feeding, grooming, toileting and psychosocial support 5. Can the patient actively participate in an intensive therapy program of at least 3 hrs of therapy 5 days a week? Yes 6. The potential for patient to make measurable gains while on inpatient rehab is excellent 7. Anticipated functional outcomes upon discharge from inpatients are: supervision and min assist PT, supervision and min assist OT, n/a SLP 8. Estimated rehab length of stay to reach the above functional goals is: 10-14 days 9. Does the patient have adequate social supports to accommodate these discharge functional goals? Yes 10. Anticipated D/C setting: Home 11. Anticipated post D/C treatments: HH therapy 12. Overall Rehab/Functional Prognosis: excellent    RECOMMENDATIONS: This patient's condition is appropriate for continued rehabilitative care in the following setting: CIR Patient has agreed to participate in recommended program.  Yes Note that insurance prior authorization may be required for reimbursement for recommended care.  Comment: Admit to inpatient rehab today  Ranelle OysterZachary T. Kyland No, MD, [facility], NewportFAAPMR [facility] 06/23/2016   Fae PippinMelissa Bowie 06/23/2016

## 2016-06-22 NOTE — Progress Notes (Signed)
Physical Therapy Treatment Patient Details Name: Andres ShadCharles Cottone MRN: [id number] DOB: 06-Feb-1960 Today's Date: 06/22/2016    History of Present Illness 56 yo male with onset of injury to neck at C5-7 with fusion (03-09-16) after falling backward on a loading dock at work Oct 2017, now admitted with ninth fall since surgery.  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  He is at work with wife when not at therapy during the day.    PT Comments    Pt lying in bed, wife reported feeling down because of difficulties getting into rehab.  Pt very eager to work with therapy, however.  Pt requires max assist with bed mobility but able to maintain seated posturing independently.  Cues for hand placements and technique with scooting to EOB and general mobility.  Pt stood with min assist of 2 for safety from elevated surface and using 1-2-3 count.  60 feet completed with ambulation with 2 needed for safety as tends to get walker too far out in front as unable to grip the walker.  Recommended bilateral platforms for walker.  Noted weakness noted in LE's by end of ambulation.  Pt requested to sit bedside with wife present at end of session.     Follow Up Recommendations  CIR     Equipment Recommendations  Other (comment) (bilateral platform attachments for walker)    Recommendations for Other Services       Precautions / Restrictions Precautions Precautions: Fall;Cervical Precaution Comments: No brace but still protective mobility Restrictions Weight Bearing Restrictions: No    Mobility  Bed Mobility Overal bed mobility: Needs Assistance Bed Mobility: Supine to Sit     Supine to sit: Max assist;HOB elevated     General bed mobility comments: Pt requires increased time for processing and sequencing, max assist to scoot to edge of bed fair sitting balance while at EOB  Transfers Overall transfer  level: Needs assistance Equipment used: Rolling walker (2 wheeled) Transfers: Sit to/from Stand Sit to Stand: Min assist;+2 physical assistance;From elevated surface         General transfer comment: Pt rocks forward and counts to 3 to prepare for standing, on 3 is able to power up very well with min guard/min assist from PT and wife.  Pt requires elevated surface to stand  Ambulation/Gait Ambulation/Gait assistance: Mod assist;+2 safety/equipment;+2 physical assistance Ambulation Distance (Feet): 60 Feet Assistive device: Rolling walker (2 wheeled) Gait Pattern/deviations: Step-to pattern;Decreased stride length;Wide base of support;Trunk flexed;Shuffle Gait velocity: reduced Gait velocity interpretation: <1.8 ft/sec, indicative of risk for recurrent falls     Stairs            Wheelchair Mobility    Modified Rankin (Stroke Patients Only)       Balance                                            Cognition Arousal/Alertness: Awake/alert Behavior During Therapy: WFL for tasks assessed/performed Overall Cognitive Status: Within Functional Limits for tasks assessed                                        Exercises      General Comments  Pertinent Vitals/Pain      Home Living                      Prior Function            PT Goals (current goals can now be found in the care plan section) Acute Rehab PT Goals Patient Stated Goal: To regain strength and be independent again PT Goal Formulation: With patient/family Time For Goal Achievement: 07/05/16 Progress towards PT goals: Progressing toward goals    Frequency    Min 3X/week      PT Plan Current plan remains appropriate    Co-evaluation              AM-PAC PT "6 Clicks" Daily Activity  Outcome Measure  Difficulty turning over in bed (including adjusting bedclothes, sheets and blankets)?: Total Difficulty moving from lying on back to  sitting on the side of the bed? : Total Difficulty sitting down on and standing up from a chair with arms (e.g., wheelchair, bedside commode, etc,.)?: A Lot Help needed moving to and from a bed to chair (including a wheelchair)?: A Lot Help needed walking in hospital room?: A Lot Help needed climbing 3-5 steps with a railing? : Total 6 Click Score: 9    End of Session Equipment Utilized During Treatment: Gait belt Activity Tolerance: Patient tolerated treatment well;Patient limited by fatigue Patient left: in bed;with call bell/phone within reach;with bed alarm set;with family/visitor present   PT Visit Diagnosis: Unsteadiness on feet (R26.81);Repeated falls (R29.6);Muscle weakness (generalized) (M62.81)     Time: 1610-9604 PT Time Calculation (min) (ACUTE ONLY): 30 min  Charges:  $Gait Training: 8-22 mins $Therapeutic Activity: 8-22 mins                    G Codes:       Lurena Nida, PTA/CLT [phone]    Emeline Gins B 06/22/2016, 3:43 PM

## 2016-06-22 NOTE — Progress Notes (Signed)
Inpatient Rehabilitation  I have spoken with Worker's Comp CM Gunnar FusiPaula and she said they will not approve IP Rehab at this time.  Per my discussion with the patient and his spouse I will now initiate insurance authorization with [employer].  Plan to follow up with the team as I know.  Please call with questions.   Charlane FerrettiMelissa Danil Wedge, M.A., CCC/SLP Admission Coordinator  [facility]  Cell [phone]

## 2016-06-22 NOTE — Progress Notes (Signed)
Inpatient Rehabilitation  Received call from RN CM Shanda BumpsJessica notifying of IP Rehab consult order.  I have reviewed chart and discussed with my team.  At this time we will await completion of medical work-up by neurology.  As well as plans for EMG and NCS in order to obtain a clearer diagnosis in hopes of then being able to obtain insurance authorization.  Please call with questions.  Charlane FerrettiMelissa Denys Labree, M.A., CCC/SLP Admission Coordinator  [facility]  Cell [phone]

## 2016-06-22 NOTE — Consult Note (Addendum)
Herbster A. Merlene Laughter, MD     [email or web address]          Steve Koch is an 56 y.o. male.   ASSESSMENT/PLAN: Gait impairment due to cervical myelopathy. I recommend intense inpatient rehabilitation for gait improvement and improve strength. The patient likely will continue to fall and sustained injuries if he does not receive intensive physical therapy.  Baclofen will be added for severe spasticity.  Head injury due to gait impairment.   Marked dysarthria of unclear etiology. Lab workup - the following labs Pending: Acetylcholine receptor antibodies. Nerve conduction and EMG apparently have been done but not available. We will attempt to obtain these.  Vitamin B12 deficiency. This should be replaced.      Family reports that the patient did have a repeat nerve conduction study with needle tomography a few days ago. The results are not available but it appears the study was done extensively. He seems to be on change regarding his neurological function. Work is being done for the patient to receive intensive inpatient therapy. The patient did ambulate today with assistance and using his walker.     GENERAL: Pleasant and in no acute distress.  HEENT: Supple. Scalp hematoma and swelling involving the right forehead. Two places are noted.  EXTREMITIES: No edema   BACK: Normal.  SKIN: Normal by inspection.    MENTAL STATUS: Alert and oriented. Speech is mildly improved today being only a markedly dysarthric; language and cognition are generally intact. Judgment and insight normal.   CRANIAL NERVES: Pupils are equal, round and reactive to light and accommodation; extra ocular movements are full, there is no significant nystagmus; visual fields are full; upper and lower facial muscles are normal in strength and symmetric, there is no flattening of the nasolabial folds; tongue is midline; uvula is midline; shoulder elevation is normal.  MOTOR: Right deltoid  3/5, right triceps 3/5 and the hand movements 2/5. Left deltoid 3/5 and triceps 4/5 and the hand movements 2/5. Hip flexion bilaterally 5 and dorsiflexion 2/5. Tone is markedly increased in the legs and markedly increased in the upper extremities. Infrequent fasciculation Leg and L arm.  COORDINATION: Left finger to nose is normal, right finger to nose is normal, No rest tremor; no intention tremor; no postural tremor; no bradykinesia.  REFLEXES: Deep tendon reflexes are symmetrical but brisk throughout in upper and lower extremities.   SENSATION: Normal to light touch.   Blood pressure 114/74, pulse 73, temperature 97.8 F (36.6 C), temperature source Oral, resp. rate 20, height _0  (1.854 m), weight 257 lb 11.5 oz (116.9 kg), SpO2 96 %.  Past Medical History:  Diagnosis Date  . Anxiety    situational with injury  . Carpal tunnel syndrome    right hand  . GERD (gastroesophageal reflux disease)   . Hypertension   . PONV (postoperative nausea and vomiting)     Past Surgical History:  Procedure Laterality Date  . ANTERIOR CERVICAL DECOMP/DISCECTOMY FUSION N/A 03/09/2016   Procedure: Anterior Cervical Discectomy Fusion Cervial 5-7;  Surgeon: Melina Schools, MD;  Location: [facility];  Service: Orthopedics;  Laterality: N/A;  Requests for 3.5 hrs  . BACK SURGERY    . COLONOSCOPY    . HAND SURGERY Left   . HERNIA REPAIR Right    inguinal  . KNEE ARTHROSCOPY Right     Family History  Problem Relation Age of Onset  . Pneumonia Mother   . Hypertension Mother   . Diabetes Father   .  Cancer Father     Social History:  reports that he quit smoking about 10 years ago. He has never used smokeless tobacco. He reports that he drinks alcohol. He reports that he does not use drugs.  Allergies:  Allergies  Allergen Reactions  . Lyrica [Pregabalin] Swelling    Medications: Prior to Admission medications   Medication Sig Start Date End Date Taking? Authorizing Provider  amLODipine  (NORVASC) 2.5 MG tablet Take 2.5 mg by mouth daily.  02/05/16  Yes [provider]  aspirin EC 81 MG tablet Take 81 mg by mouth daily.   Yes [provider]  ibuprofen (ADVIL,MOTRIN) 200 MG tablet Take 400 mg by mouth every 6 (six) hours as needed.   Yes [provider]  omeprazole (PRILOSEC) 20 MG capsule Take 20 mg by mouth daily.   Yes [provider]  oxymetazoline (AFRIN) 0.05 % nasal spray Place 1 spray into both nostrils 2 (two) times daily as needed for congestion.   Yes [provider]    Scheduled Meds: . amLODipine  2.5 mg Oral Daily  . aspirin EC  81 mg Oral Daily  . cyanocobalamin  1,000 mcg Intramuscular Q2000  . enoxaparin (LOVENOX) injection  40 mg Subcutaneous Q24H  . pantoprazole  40 mg Oral Daily  . vitamin B-12  500 mcg Oral Daily   Continuous Infusions: . sodium chloride 10 mL/hr at 06/21/16 0353   PRN Meds:.ibuprofen, LORazepam, ondansetron **OR** ondansetron (ZOFRAN) IV     Results for orders placed or performed during the hospital encounter of 06/20/16 (from the past 48 hour(s))  CBC with Differential/Platelet     Status: Abnormal   Collection Time: 06/20/16 11:37 PM  Result Value Ref Range   WBC 12.1 (H) 4.0 - 10.5 K/uL   RBC 4.98 4.22 - 5.81 MIL/uL   Hemoglobin 15.3 13.0 - 17.0 g/dL   HCT 41.8 39.0 - 52.0 %   MCV 83.9 78.0 - 100.0 fL   MCH 30.7 26.0 - 34.0 pg   MCHC 36.3 (H) 30.0 - 36.0 g/dL   RDW 14.2 11.5 - 15.5 %   Platelets 247 150 - 400 K/uL   Neutrophils Relative % 72 %   Neutro Abs 8.7 (H) 1.7 - 7.7 K/uL   Lymphocytes Relative 20 %   Lymphs Abs 2.4 0.7 - 4.0 K/uL   Monocytes Relative 7 %   Monocytes Absolute 0.9 0.1 - 1.0 K/uL   Eosinophils Relative 1 %   Eosinophils Absolute 0.1 0.0 - 0.7 K/uL   Basophils Relative 0 %   Basophils Absolute 0.0 0.0 - 0.1 K/uL  Basic metabolic panel     Status: Abnormal   Collection Time: 06/20/16 11:37 PM  Result Value Ref Range   Sodium 139 135 - 145 mmol/L     Potassium 3.7 3.5 - 5.1 mmol/L   Chloride 105 101 - 111 mmol/L   CO2 25 22 - 32 mmol/L   Glucose, Bld 115 (H) 65 - 99 mg/dL   BUN 12 6 - 20 mg/dL   Creatinine, Ser 0.77 0.61 - 1.24 mg/dL   Calcium 9.2 8.9 - 10.3 mg/dL   GFR calc non Af Amer >60 >60 mL/min   GFR calc Af Amer >60 >60 mL/min    Comment: (NOTE) The eGFR has been calculated using the CKD EPI equation. This calculation has not been validated in all clinical situations. eGFR's persistently <60 mL/min signify possible Chronic Kidney Disease.    Anion gap 9 5 - 15  TSH     Status: None   Collection Time: 06/20/16 11:57 PM  Result Value Ref Range   TSH 1.758 0.350 - 4.500 uIU/mL    Comment: Performed by a 3rd Generation assay with a functional sensitivity of <=0.01 uIU/mL.  CBC     Status: None   Collection Time: 06/21/16  5:28 AM  Result Value Ref Range   WBC 10.2 4.0 - 10.5 K/uL   RBC 4.97 4.22 - 5.81 MIL/uL   Hemoglobin 15.1 13.0 - 17.0 g/dL   HCT 41.9 39.0 - 52.0 %   MCV 84.3 78.0 - 100.0 fL   MCH 30.4 26.0 - 34.0 pg   MCHC 36.0 30.0 - 36.0 g/dL   RDW 14.2 11.5 - 15.5 %   Platelets 250 150 - 400 K/uL  Comprehensive metabolic panel     Status: None   Collection Time: 06/21/16  5:28 AM  Result Value Ref Range   Sodium 141 135 - 145 mmol/L   Potassium 4.5 3.5 - 5.1 mmol/L    Comment: DELTA CHECK NOTED   Chloride 107 101 - 111 mmol/L   CO2 28 22 - 32 mmol/L   Glucose, Bld 98 65 - 99 mg/dL   BUN 12 6 - 20 mg/dL   Creatinine, Ser 0.80 0.61 - 1.24 mg/dL   Calcium 9.5 8.9 - 10.3 mg/dL   Total Protein 6.9 6.5 - 8.1 g/dL   Albumin 4.0 3.5 - 5.0 g/dL   AST 18 15 - 41 U/L   ALT 21 17 - 63 U/L   Alkaline Phosphatase 69 38 - 126 U/L   Total Bilirubin 1.2 0.3 - 1.2 mg/dL   GFR calc non Af Amer >60 >60 mL/min   GFR calc Af Amer >60 >60 mL/min    Comment: (NOTE) The eGFR has been calculated using the CKD EPI equation. This calculation has not been validated in all clinical situations. eGFR's persistently <60  mL/min signify possible Chronic Kidney Disease.    Anion gap 6 5 - 15  Urinalysis, Complete w Microscopic     Status: Abnormal   Collection Time: 06/21/16  9:30 AM  Result Value Ref Range   Color, Urine STRAW (A) YELLOW   APPearance CLEAR CLEAR   Specific Gravity, Urine 1.006 1.005 - 1.030   pH 7.0 5.0 - 8.0   Glucose, UA NEGATIVE NEGATIVE mg/dL   Hgb urine dipstick NEGATIVE NEGATIVE   Bilirubin Urine NEGATIVE NEGATIVE   Ketones, ur NEGATIVE NEGATIVE mg/dL   Protein, ur NEGATIVE NEGATIVE mg/dL   Nitrite NEGATIVE NEGATIVE   Leukocytes, UA NEGATIVE NEGATIVE   RBC / HPF NONE SEEN 0 - 5 RBC/hpf   WBC, UA NONE SEEN 0 - 5 WBC/hpf   Bacteria, UA RARE (A) NONE SEEN   Squamous Epithelial / LPF 0-5 (A) NONE SEEN   Mucous PRESENT   Culture, Urine     Status: None   Collection Time: 06/21/16  9:30 AM  Result Value Ref Range   Specimen Description URINE, CLEAN CATCH    Special Requests NONE    Culture      NO GROWTH Performed at [facility], [address], [place], [place]  [address]    Report Status 06/22/2016 FINAL   Vitamin B12     Status: None   Collection Time: 06/21/16  9:40 AM  Result Value Ref Range   Vitamin B-12 228 180 - 914 pg/mL    Comment: (NOTE) This assay is not validated for testing neonatal or  myeloproliferative syndrome specimens for Vitamin B12 levels. Performed at [facility], [place] [address], [place], [place] [address]   HIV antibody     Status: None   Collection Time: 06/21/16 11:10 AM  Result Value Ref Range   HIV Screen 4th Generation wRfx Non Reactive Non Reactive    Comment: (NOTE) Performed At: [facility] [place], [place] [id number] Lindon Romp MD YB:[id number]   CK total and CKMB (cardiac)not at [facility]     Status: Abnormal   Collection Time: 06/22/16  5:40 AM  Result Value Ref Range   Total CK 126 49 - 397 U/L   CK, MB 4.3 0.5 - 5.0 ng/mL   Relative Index 3.4 (H) 0.0 - 2.5    Comment: Performed at [facility] [address], [place],  [address]    Studies/Results:    CLINICAL DATA:  Fall striking face while using walker. No loss of consciousness.  EXAM: CT HEAD WITHOUT CONTRAST  CT MAXILLOFACIAL WITHOUT CONTRAST  CT CERVICAL SPINE WITHOUT CONTRAST  TECHNIQUE: Multidetector CT imaging of the head, cervical spine, and maxillofacial structures were performed using the standard protocol without intravenous contrast. Multiplanar CT image reconstructions of the cervical spine and maxillofacial structures were also generated.  COMPARISON:  None.  FINDINGS: CT HEAD FINDINGS  Brain: No evidence of acute infarction, hemorrhage, hydrocephalus, extra-axial collection or mass lesion/mass effect.  Vascular: No hyperdense vessel or unexpected calcification.  Skull: No skull fracture.  Right frontal scalp hematoma.  Other: None.  CT MAXILLOFACIAL FINDINGS  Osseous: No acute facial bone fracture. Possible remote nasal bone fracture. Zygomatic arches, mandibles, and pterygoid plates are intact. There is anterior subluxation of the a right temporomandibular joint.  Orbits: No orbital fracture.  Both orbits and globes are intact.  Sinuses: No fluid levels. Scattered mucosal thickening in the maxillary sinuses and ethmoid air cells.  Soft tissues: Right frontal scalp hematoma.  CT CERVICAL SPINE FINDINGS  Alignment: Straightening of normal lordosis. No listhesis, jumped or perched facets. Lateral masses of C1 are well aligned on C2.  Skull base and vertebrae: No acute fracture. Dens and skullbase are intact. Anterior fusion C5 through C7 with intact hardware. Non fusion posterior elements of C1, a normal variant.  Soft tissues and spinal canal: No prevertebral fluid or swelling. No visible canal hematoma.  Disc levels: Disc spacers at C5-C6 and C6-C7. Minimal endplate spurring at K5-T9.  Upper chest: No acute abnormality.  Other:  None.  IMPRESSION: 1. Right frontal scalp hematoma. No skull fracture or acute intracranial abnormality. 2. No facial bone fracture. 3. Postsurgical change in the cervical spine with intact hardware. No fracture or acute subluxation.    REPEAT C SPINE MRI 06/16/2016 FINDINGS: . Bones:Vertebral body heights are maintained. No marrow edema or focal bone lesion . Spinal cord: Normal size, contour, and signal without evidence of mass or demyelinating lesion . Alignment: Anatomic without subluxation. . Degenerative changes:  . C1-C2: No significant central canal or neural foraminal stenosis. . C2-C3: No significant central canal or neural foraminal stenosis. . C3-C4: No significant central canal or neural foraminal stenosis. Marland Kitchen C4-C5: No significant central canal or neural foraminal stenosis. Marland Kitchen C5-C6: Status post ACDF without hardware failure. There are right posterior paracentral osteophytes abutting the right ventral spinal cord without definite cord signal abnormality. . C6-C7: Status post ACDF without hardware failure or stenosis . C7-T1: No significant central canal or neural foraminal stenosis. . T2-T3: left paracentral focal disc  protrusion . Additional findings: None. No abnormal enhancement         Loa Idler A. Merlene Laughter, M.D.  Diplomate, Tax adviser of Psychiatry and Neurology ( Neurology). 06/22/2016, 5:32 PM

## 2016-06-23 ENCOUNTER — Inpatient Hospital Stay (HOSPITAL_COMMUNITY)
Admission: RE | Admit: 2016-06-23 | Discharge: 2016-07-15 | DRG: 052 | Disposition: A | Discharge status: Home / Self Care | Payer: BLUE CROSS/BLUE SHIELD | Source: Other Acute Inpatient Hospital | Attending: Physical Medicine & Rehabilitation | Admitting: Physical Medicine & Rehabilitation

## 2016-06-23 DIAGNOSIS — M50022 Cervical disc disorder at C5-C6 level with myelopathy: Secondary | ICD-10-CM | POA: Diagnosis present

## 2016-06-23 DIAGNOSIS — E8809 Other disorders of plasma-protein metabolism, not elsewhere classified: Secondary | ICD-10-CM | POA: Diagnosis present

## 2016-06-23 DIAGNOSIS — K219 Gastro-esophageal reflux disease without esophagitis: Secondary | ICD-10-CM | POA: Diagnosis present

## 2016-06-23 DIAGNOSIS — F411 Generalized anxiety disorder: Secondary | ICD-10-CM | POA: Diagnosis present

## 2016-06-23 DIAGNOSIS — Z8249 Family history of ischemic heart disease and other diseases of the circulatory system: Secondary | ICD-10-CM

## 2016-06-23 DIAGNOSIS — E538 Deficiency of other specified B group vitamins: Secondary | ICD-10-CM | POA: Diagnosis present

## 2016-06-23 DIAGNOSIS — G1222 Progressive bulbar palsy: Secondary | ICD-10-CM | POA: Diagnosis not present

## 2016-06-23 DIAGNOSIS — R471 Dysarthria and anarthria: Secondary | ICD-10-CM | POA: Diagnosis present

## 2016-06-23 DIAGNOSIS — G9589 Other specified diseases of spinal cord: Secondary | ICD-10-CM | POA: Diagnosis present

## 2016-06-23 DIAGNOSIS — Y990 Civilian activity done for income or pay: Secondary | ICD-10-CM | POA: Diagnosis not present

## 2016-06-23 DIAGNOSIS — Z981 Arthrodesis status: Secondary | ICD-10-CM

## 2016-06-23 DIAGNOSIS — F0781 Postconcussional syndrome: Secondary | ICD-10-CM | POA: Diagnosis not present

## 2016-06-23 DIAGNOSIS — K5900 Constipation, unspecified: Secondary | ICD-10-CM | POA: Diagnosis present

## 2016-06-23 DIAGNOSIS — Z87891 Personal history of nicotine dependence: Secondary | ICD-10-CM | POA: Diagnosis not present

## 2016-06-23 DIAGNOSIS — G1221 Amyotrophic lateral sclerosis: Secondary | ICD-10-CM

## 2016-06-23 DIAGNOSIS — I10 Essential (primary) hypertension: Secondary | ICD-10-CM | POA: Diagnosis present

## 2016-06-23 DIAGNOSIS — M62541 Muscle wasting and atrophy, not elsewhere classified, right hand: Secondary | ICD-10-CM

## 2016-06-23 DIAGNOSIS — G959 Disease of spinal cord, unspecified: Secondary | ICD-10-CM

## 2016-06-23 DIAGNOSIS — R269 Unspecified abnormalities of gait and mobility: Secondary | ICD-10-CM

## 2016-06-23 DIAGNOSIS — R2689 Other abnormalities of gait and mobility: Secondary | ICD-10-CM | POA: Diagnosis present

## 2016-06-23 DIAGNOSIS — E46 Unspecified protein-calorie malnutrition: Secondary | ICD-10-CM | POA: Diagnosis not present

## 2016-06-23 DIAGNOSIS — R209 Unspecified disturbances of skin sensation: Secondary | ICD-10-CM

## 2016-06-23 DIAGNOSIS — R2681 Unsteadiness on feet: Secondary | ICD-10-CM

## 2016-06-23 DIAGNOSIS — W19XXXD Unspecified fall, subsequent encounter: Secondary | ICD-10-CM | POA: Diagnosis present

## 2016-06-23 DIAGNOSIS — W19XXXA Unspecified fall, initial encounter: Secondary | ICD-10-CM

## 2016-06-23 DIAGNOSIS — M62542 Muscle wasting and atrophy, not elsewhere classified, left hand: Secondary | ICD-10-CM

## 2016-06-23 DIAGNOSIS — G8250 Quadriplegia, unspecified: Secondary | ICD-10-CM | POA: Diagnosis present

## 2016-06-23 DIAGNOSIS — M6281 Muscle weakness (generalized): Secondary | ICD-10-CM | POA: Diagnosis not present

## 2016-06-23 MED ORDER — TRAMADOL HCL 50 MG PO TABS
50.0000 mg | ORAL_TABLET | Freq: Four times a day (QID) | ORAL | Status: DC | PRN
  Filled 2016-06-23: qty 1

## 2016-06-23 MED ORDER — BACLOFEN 10 MG PO TABS
10.0000 mg | ORAL_TABLET | Freq: Three times a day (TID) | ORAL | Status: DC
  Administered 2016-06-23 – 2016-06-27 (×11): 10 mg via ORAL
  Filled 2016-06-23 (×11): qty 1

## 2016-06-23 MED ORDER — AMLODIPINE BESYLATE 2.5 MG PO TABS
2.5000 mg | ORAL_TABLET | Freq: Every day | ORAL | Status: DC
  Administered 2016-06-24 – 2016-07-15 (×22): 2.5 mg via ORAL
  Filled 2016-06-23 (×22): qty 1

## 2016-06-23 MED ORDER — TRAZODONE HCL 50 MG PO TABS
25.0000 mg | ORAL_TABLET | Freq: Every evening | ORAL | Status: DC | PRN
  Administered 2016-06-23 – 2016-07-14 (×21): 50 mg via ORAL
  Filled 2016-06-23 (×21): qty 1

## 2016-06-23 MED ORDER — CYANOCOBALAMIN 1000 MCG/ML IJ SOLN: 1000.0000 ug | Freq: Every day | INTRAMUSCULAR | 0 refills | Status: DC

## 2016-06-23 MED ORDER — BISACODYL 10 MG RE SUPP
10.0000 mg | Freq: Every day | RECTAL | Status: DC | PRN
  Administered 2016-06-27 – 2016-07-04 (×2): 10 mg via RECTAL
  Filled 2016-06-23 (×2): qty 1

## 2016-06-23 MED ORDER — ALUM & MAG HYDROXIDE-SIMETH 200-200-20 MG/5ML PO SUSP: 30.0000 mL | ORAL | Status: DC | PRN

## 2016-06-23 MED ORDER — BACLOFEN 10 MG PO TABS: 10.0000 mg | ORAL_TABLET | Freq: Three times a day (TID) | ORAL | 0 refills | Status: DC

## 2016-06-23 MED ORDER — PROCHLORPERAZINE MALEATE 5 MG PO TABS: 5.0000 mg | ORAL_TABLET | Freq: Four times a day (QID) | ORAL | Status: DC | PRN

## 2016-06-23 MED ORDER — PROCHLORPERAZINE EDISYLATE 5 MG/ML IJ SOLN: 5.0000 mg | Freq: Four times a day (QID) | INTRAMUSCULAR | Status: DC | PRN

## 2016-06-23 MED ORDER — ASPIRIN EC 81 MG PO TBEC
81.0000 mg | DELAYED_RELEASE_TABLET | Freq: Every day | ORAL | Status: DC
  Administered 2016-06-24 – 2016-07-15 (×22): 81 mg via ORAL
  Filled 2016-06-23 (×22): qty 1

## 2016-06-23 MED ORDER — CYANOCOBALAMIN 1000 MCG/ML IJ SOLN
1000.0000 ug | Freq: Every day | INTRAMUSCULAR | Status: DC
  Administered 2016-06-23 – 2016-06-29 (×7): 1000 ug via INTRAMUSCULAR
  Filled 2016-06-23 (×8): qty 1

## 2016-06-23 MED ORDER — VITAMIN B-12 1000 MCG PO TABS
500.0000 ug | ORAL_TABLET | Freq: Every day | ORAL | Status: DC
  Administered 2016-06-24 – 2016-07-15 (×22): 500 ug via ORAL
  Filled 2016-06-23 (×23): qty 1

## 2016-06-23 MED ORDER — DIPHENHYDRAMINE HCL 12.5 MG/5ML PO ELIX: 12.5000 mg | ORAL_SOLUTION | Freq: Four times a day (QID) | ORAL | Status: DC | PRN

## 2016-06-23 MED ORDER — ACETAMINOPHEN 325 MG PO TABS: 325.0000 mg | ORAL_TABLET | ORAL | Status: DC | PRN

## 2016-06-23 MED ORDER — FLEET ENEMA 7-19 GM/118ML RE ENEM: 1.0000 | ENEMA | Freq: Once | RECTAL | Status: DC | PRN

## 2016-06-23 MED ORDER — POLYETHYLENE GLYCOL 3350 17 G PO PACK
17.0000 g | PACK | Freq: Every day | ORAL | Status: DC | PRN
  Administered 2016-06-26 – 2016-07-03 (×4): 17 g via ORAL
  Filled 2016-06-23 (×4): qty 1

## 2016-06-23 MED ORDER — PANTOPRAZOLE SODIUM 40 MG PO TBEC
40.0000 mg | DELAYED_RELEASE_TABLET | Freq: Every day | ORAL | Status: DC
  Administered 2016-06-24 – 2016-07-15 (×22): 40 mg via ORAL
  Filled 2016-06-23 (×22): qty 1

## 2016-06-23 MED ORDER — GUAIFENESIN-DM 100-10 MG/5ML PO SYRP: 5.0000 mL | ORAL_SOLUTION | Freq: Four times a day (QID) | ORAL | Status: DC | PRN

## 2016-06-23 MED ORDER — PROCHLORPERAZINE 25 MG RE SUPP: 12.5000 mg | Freq: Four times a day (QID) | RECTAL | Status: DC | PRN

## 2016-06-23 MED ORDER — ENOXAPARIN SODIUM 40 MG/0.4ML ~~LOC~~ SOLN
40.0000 mg | SUBCUTANEOUS | Status: DC
  Administered 2016-06-24 – 2016-07-14 (×21): 40 mg via SUBCUTANEOUS
  Filled 2016-06-23 (×21): qty 0.4

## 2016-06-23 NOTE — Progress Notes (Addendum)
Inpatient Rehabilitation (Late Entry)  [facility] [employer] and have a bed to offer patient today.  Patient and spouse in agreement with plan, I have received medical clearance, and will proceed with admission today.  I have updated team.  Please call with questions.   Charlane FerrettiMelissa Jacquelinne Speak, M.A., CCC/SLP Admission Coordinator  [facility]  Cell [phone]

## 2016-06-23 NOTE — Discharge Summary (Signed)
Physician Discharge Summary  Rohil Lesch ZOX:[id number] DOB: Jul 17, 1960 DOA: 06/20/2016  PCP: [facility]  Admit date: 06/20/2016 Discharge date: 06/23/2016  Admitted From: Home Disposition:  CIR  Recommendations for Outpatient Follow-up:  1. Follow up with PCP in 1-2 weeks 2. Please obtain BMP/CBC in one week 3. Recheck serum B12 in one month    Discharge Condition: Stable CODE STATUS:FULL Diet recommendation: Regular   Brief/Interim Summary: 56 year old male with a history of hypertension and GERD presents with R>L upper extremity weakness and bilateral lower extremity weakness. The patient had a work-related accident when he fell off his [employer] truck in October 2017 resulting in cervical myelopathy from herniated nucleus pulposus. The patient was taken to surgery ( [facility])on 03/09/2016 performed by Dr. Venita Lick. Since the surgery, the patient states that his upper extremity and lower extremity weakness have been fairly stable without significant improvement. He has had numerous falls, nine according to the patient, since the surgery.He relates that many of his falls have been attributed to his upper extremity weakness as well as "clumsy legs". He denies any bowel or bladder incontinence. He denies any neck or lower back pain. He denies any fevers, chills, chest pain, shortness breath, nausea, vomiting, diarrhea, abdominal pain, dysuria. Because of his progressive falls, the patient presented to the hospital and neurology consultation was obtained   Discharge Diagnoses:  Cervical myelopathy with upper and lower extremity weakness -03/09/2016--anterior cervical discectomy fusion--C5-7--Dr. Venita Lick -neurology consult appreciated--believes his weakness is due to cervical myelopathy -low serum B12 also a contributor to his weakness--received B12 1000 mcg IM x 2 during the admission.  -continue IM B12 x 5 more days (last dose on 06/27/16), then  once per week x 4 weeks -recheck B12 level in one month -apparently had abnormal Nerve Conduction study 06/20/16 as expected -PT and OT eval--pt would benefit from aggressive inpatient rehab -06/16/2016 MRI cervical spine--persistent abutment of osteophyte of right ventral spinal cord C5-6 without spinal cord abnormal signal--no other stenosis, no hardware failure -06/20/2016 CT cervical spine--no fracture, subluxation, prevertebral hematoma -case discussed with Anette Riedel, PA-C from Dr. Shon Baton' office--no acute surgical issues presently  Essential hypertension -continue amlodipine -well controlled  Frequent falls with scalp hematoma/Gait Instability -PT/OT---pt would benefit from aggressive inpatient rehab -06/20/2016 CT maxillofacial--no skull fracture, positive right scalp hematoma -check UA--no pyuria -check B12--228-->start supplementation -check TSH--1.758  GERD -continue PPI  Sensory disturbance -Numbness and tingling in the bilateral great toes - neurology consult appreciated -low B12--start supplementation -HIV neg -suspect low B12 may be related to chronic PPI use   Discharge Instructions  Discharge Instructions    Diet - low sodium heart healthy    Complete by:  As directed    Increase activity slowly    Complete by:  As directed      Allergies as of 06/23/2016      Reactions   Lyrica [pregabalin] Swelling      Medication List    TAKE these medications   amLODipine 2.5 MG tablet Commonly known as:  NORVASC Take 2.5 mg by mouth daily.   aspirin EC 81 MG tablet Take 81 mg by mouth daily.   baclofen 10 MG tablet Commonly known as:  LIORESAL Take 1 tablet (10 mg total) by mouth 3 (three) times daily.   cyanocobalamin 1000 MCG/ML injection Commonly known as:  (VITAMIN B-12) Inject 1 mL (1,000 mcg total) into the muscle daily at 8 pm. X 5 days, then once weekly x 1 month  ibuprofen 200 MG tablet Commonly known as:  ADVIL,MOTRIN Take 400 mg by  mouth every 6 (six) hours as needed.   omeprazole 20 MG capsule Commonly known as:  PRILOSEC Take 20 mg by mouth daily.   oxymetazoline 0.05 % nasal spray Commonly known as:  AFRIN Place 1 spray into both nostrils 2 (two) times daily as needed for congestion.       Allergies  Allergen Reactions  . Lyrica [Pregabalin] Swelling    Consultations:  PM&R  neurology   Procedures/Studies: Ct Head Wo Contrast  Result Date: 06/20/2016 CLINICAL DATA:  Fall striking face while using walker. No loss of consciousness. EXAM: CT HEAD WITHOUT CONTRAST CT MAXILLOFACIAL WITHOUT CONTRAST CT CERVICAL SPINE WITHOUT CONTRAST TECHNIQUE: Multidetector CT imaging of the head, cervical spine, and maxillofacial structures were performed using the standard protocol without intravenous contrast. Multiplanar CT image reconstructions of the cervical spine and maxillofacial structures were also generated. COMPARISON:  None. FINDINGS: CT HEAD FINDINGS Brain: No evidence of acute infarction, hemorrhage, hydrocephalus, extra-axial collection or mass lesion/mass effect. Vascular: No hyperdense vessel or unexpected calcification. Skull: No skull fracture.  Right frontal scalp hematoma. Other: None. CT MAXILLOFACIAL FINDINGS Osseous: No acute facial bone fracture. Possible remote nasal bone fracture. Zygomatic arches, mandibles, and pterygoid plates are intact. There is anterior subluxation of the a right temporomandibular joint. Orbits: No orbital fracture.  Both orbits and globes are intact. Sinuses: No fluid levels. Scattered mucosal thickening in the maxillary sinuses and ethmoid air cells. Soft tissues: Right frontal scalp hematoma. CT CERVICAL SPINE FINDINGS Alignment: Straightening of normal lordosis. No listhesis, jumped or perched facets. Lateral masses of C1 are well aligned on C2. Skull base and vertebrae: No acute fracture. Dens and skullbase are intact. Anterior fusion C5 through C7 with intact hardware. Non  fusion posterior elements of C1, a normal variant. Soft tissues and spinal canal: No prevertebral fluid or swelling. No visible canal hematoma. Disc levels: Disc spacers at C5-C6 and C6-C7. Minimal endplate spurring at C4-C5. Upper chest: No acute abnormality. Other: None. IMPRESSION: 1. Right frontal scalp hematoma. No skull fracture or acute intracranial abnormality. 2. No facial bone fracture. 3. Postsurgical change in the cervical spine with intact hardware. No fracture or acute subluxation. Electronically Signed   By: Rubye OaksMelanie  Ehinger M.D.   On: 06/20/2016 23:10   Ct Cervical Spine Wo Contrast  Result Date: 06/20/2016 CLINICAL DATA:  Fall striking face while using walker. No loss of consciousness. EXAM: CT HEAD WITHOUT CONTRAST CT MAXILLOFACIAL WITHOUT CONTRAST CT CERVICAL SPINE WITHOUT CONTRAST TECHNIQUE: Multidetector CT imaging of the head, cervical spine, and maxillofacial structures were performed using the standard protocol without intravenous contrast. Multiplanar CT image reconstructions of the cervical spine and maxillofacial structures were also generated. COMPARISON:  None. FINDINGS: CT HEAD FINDINGS Brain: No evidence of acute infarction, hemorrhage, hydrocephalus, extra-axial collection or mass lesion/mass effect. Vascular: No hyperdense vessel or unexpected calcification. Skull: No skull fracture.  Right frontal scalp hematoma. Other: None. CT MAXILLOFACIAL FINDINGS Osseous: No acute facial bone fracture. Possible remote nasal bone fracture. Zygomatic arches, mandibles, and pterygoid plates are intact. There is anterior subluxation of the a right temporomandibular joint. Orbits: No orbital fracture.  Both orbits and globes are intact. Sinuses: No fluid levels. Scattered mucosal thickening in the maxillary sinuses and ethmoid air cells. Soft tissues: Right frontal scalp hematoma. CT CERVICAL SPINE FINDINGS Alignment: Straightening of normal lordosis. No listhesis, jumped or perched facets.  Lateral masses of C1 are well aligned on C2. Skull  base and vertebrae: No acute fracture. Dens and skullbase are intact. Anterior fusion C5 through C7 with intact hardware. Non fusion posterior elements of C1, a normal variant. Soft tissues and spinal canal: No prevertebral fluid or swelling. No visible canal hematoma. Disc levels: Disc spacers at C5-C6 and C6-C7. Minimal endplate spurring at C4-C5. Upper chest: No acute abnormality. Other: None. IMPRESSION: 1. Right frontal scalp hematoma. No skull fracture or acute intracranial abnormality. 2. No facial bone fracture. 3. Postsurgical change in the cervical spine with intact hardware. No fracture or acute subluxation. Electronically Signed   By: Rubye Oaks M.D.   On: 06/20/2016 23:10   Ct Maxillofacial Wo Contrast  Result Date: 06/20/2016 CLINICAL DATA:  Fall striking face while using walker. No loss of consciousness. EXAM: CT HEAD WITHOUT CONTRAST CT MAXILLOFACIAL WITHOUT CONTRAST CT CERVICAL SPINE WITHOUT CONTRAST TECHNIQUE: Multidetector CT imaging of the head, cervical spine, and maxillofacial structures were performed using the standard protocol without intravenous contrast. Multiplanar CT image reconstructions of the cervical spine and maxillofacial structures were also generated. COMPARISON:  None. FINDINGS: CT HEAD FINDINGS Brain: No evidence of acute infarction, hemorrhage, hydrocephalus, extra-axial collection or mass lesion/mass effect. Vascular: No hyperdense vessel or unexpected calcification. Skull: No skull fracture.  Right frontal scalp hematoma. Other: None. CT MAXILLOFACIAL FINDINGS Osseous: No acute facial bone fracture. Possible remote nasal bone fracture. Zygomatic arches, mandibles, and pterygoid plates are intact. There is anterior subluxation of the a right temporomandibular joint. Orbits: No orbital fracture.  Both orbits and globes are intact. Sinuses: No fluid levels. Scattered mucosal thickening in the maxillary sinuses and  ethmoid air cells. Soft tissues: Right frontal scalp hematoma. CT CERVICAL SPINE FINDINGS Alignment: Straightening of normal lordosis. No listhesis, jumped or perched facets. Lateral masses of C1 are well aligned on C2. Skull base and vertebrae: No acute fracture. Dens and skullbase are intact. Anterior fusion C5 through C7 with intact hardware. Non fusion posterior elements of C1, a normal variant. Soft tissues and spinal canal: No prevertebral fluid or swelling. No visible canal hematoma. Disc levels: Disc spacers at C5-C6 and C6-C7. Minimal endplate spurring at C4-C5. Upper chest: No acute abnormality. Other: None. IMPRESSION: 1. Right frontal scalp hematoma. No skull fracture or acute intracranial abnormality. 2. No facial bone fracture. 3. Postsurgical change in the cervical spine with intact hardware. No fracture or acute subluxation. Electronically Signed   By: Rubye Oaks M.D.   On: 06/20/2016 23:10        Discharge Exam: Vitals:   06/23/16 0526 06/23/16 1300  BP: 112/79 110/77  Pulse: (!) 58 68  Resp: 20 20  Temp: 98.1 F (36.7 C)    Vitals:   06/22/16 1300 06/22/16 2129 06/23/16 0526 06/23/16 1300  BP: 114/74 124/70 112/79 110/77  Pulse: 73 70 (!) 58 68  Resp: 20 20 20 20   Temp:  98.6 F (37 C) 98.1 F (36.7 C)   TempSrc:  Oral Oral   SpO2: 96% 99% 97% 97%  Weight:      Height:        General: Pt is alert, awake, not in acute distress Cardiovascular: RRR, S1/S2 +, no rubs, no gallops Respiratory: CTA bilaterally, no wheezing, no rhonchi Abdominal: Soft, NT, ND, bowel sounds + Extremities: no edema, no cyanosis   The results of significant diagnostics from this hospitalization (including imaging, microbiology, ancillary and laboratory) are listed below for reference.    Significant Diagnostic Studies: Ct Head Wo Contrast  Result Date: 06/20/2016 CLINICAL DATA:  Fall striking face while using walker. No loss of consciousness. EXAM: CT HEAD WITHOUT CONTRAST CT  MAXILLOFACIAL WITHOUT CONTRAST CT CERVICAL SPINE WITHOUT CONTRAST TECHNIQUE: Multidetector CT imaging of the head, cervical spine, and maxillofacial structures were performed using the standard protocol without intravenous contrast. Multiplanar CT image reconstructions of the cervical spine and maxillofacial structures were also generated. COMPARISON:  None. FINDINGS: CT HEAD FINDINGS Brain: No evidence of acute infarction, hemorrhage, hydrocephalus, extra-axial collection or mass lesion/mass effect. Vascular: No hyperdense vessel or unexpected calcification. Skull: No skull fracture.  Right frontal scalp hematoma. Other: None. CT MAXILLOFACIAL FINDINGS Osseous: No acute facial bone fracture. Possible remote nasal bone fracture. Zygomatic arches, mandibles, and pterygoid plates are intact. There is anterior subluxation of the a right temporomandibular joint. Orbits: No orbital fracture.  Both orbits and globes are intact. Sinuses: No fluid levels. Scattered mucosal thickening in the maxillary sinuses and ethmoid air cells. Soft tissues: Right frontal scalp hematoma. CT CERVICAL SPINE FINDINGS Alignment: Straightening of normal lordosis. No listhesis, jumped or perched facets. Lateral masses of C1 are well aligned on C2. Skull base and vertebrae: No acute fracture. Dens and skullbase are intact. Anterior fusion C5 through C7 with intact hardware. Non fusion posterior elements of C1, a normal variant. Soft tissues and spinal canal: No prevertebral fluid or swelling. No visible canal hematoma. Disc levels: Disc spacers at C5-C6 and C6-C7. Minimal endplate spurring at C4-C5. Upper chest: No acute abnormality. Other: None. IMPRESSION: 1. Right frontal scalp hematoma. No skull fracture or acute intracranial abnormality. 2. No facial bone fracture. 3. Postsurgical change in the cervical spine with intact hardware. No fracture or acute subluxation. Electronically Signed   By: Rubye Oaks M.D.   On: 06/20/2016 23:10    Ct Cervical Spine Wo Contrast  Result Date: 06/20/2016 CLINICAL DATA:  Fall striking face while using walker. No loss of consciousness. EXAM: CT HEAD WITHOUT CONTRAST CT MAXILLOFACIAL WITHOUT CONTRAST CT CERVICAL SPINE WITHOUT CONTRAST TECHNIQUE: Multidetector CT imaging of the head, cervical spine, and maxillofacial structures were performed using the standard protocol without intravenous contrast. Multiplanar CT image reconstructions of the cervical spine and maxillofacial structures were also generated. COMPARISON:  None. FINDINGS: CT HEAD FINDINGS Brain: No evidence of acute infarction, hemorrhage, hydrocephalus, extra-axial collection or mass lesion/mass effect. Vascular: No hyperdense vessel or unexpected calcification. Skull: No skull fracture.  Right frontal scalp hematoma. Other: None. CT MAXILLOFACIAL FINDINGS Osseous: No acute facial bone fracture. Possible remote nasal bone fracture. Zygomatic arches, mandibles, and pterygoid plates are intact. There is anterior subluxation of the a right temporomandibular joint. Orbits: No orbital fracture.  Both orbits and globes are intact. Sinuses: No fluid levels. Scattered mucosal thickening in the maxillary sinuses and ethmoid air cells. Soft tissues: Right frontal scalp hematoma. CT CERVICAL SPINE FINDINGS Alignment: Straightening of normal lordosis. No listhesis, jumped or perched facets. Lateral masses of C1 are well aligned on C2. Skull base and vertebrae: No acute fracture. Dens and skullbase are intact. Anterior fusion C5 through C7 with intact hardware. Non fusion posterior elements of C1, a normal variant. Soft tissues and spinal canal: No prevertebral fluid or swelling. No visible canal hematoma. Disc levels: Disc spacers at C5-C6 and C6-C7. Minimal endplate spurring at C4-C5. Upper chest: No acute abnormality. Other: None. IMPRESSION: 1. Right frontal scalp hematoma. No skull fracture or acute intracranial abnormality. 2. No facial bone fracture. 3.  Postsurgical change in the cervical spine with intact hardware. No fracture or acute subluxation. Electronically Signed   By:  Rubye Oaks M.D.   On: 06/20/2016 23:10   Ct Maxillofacial Wo Contrast  Result Date: 06/20/2016 CLINICAL DATA:  Fall striking face while using walker. No loss of consciousness. EXAM: CT HEAD WITHOUT CONTRAST CT MAXILLOFACIAL WITHOUT CONTRAST CT CERVICAL SPINE WITHOUT CONTRAST TECHNIQUE: Multidetector CT imaging of the head, cervical spine, and maxillofacial structures were performed using the standard protocol without intravenous contrast. Multiplanar CT image reconstructions of the cervical spine and maxillofacial structures were also generated. COMPARISON:  None. FINDINGS: CT HEAD FINDINGS Brain: No evidence of acute infarction, hemorrhage, hydrocephalus, extra-axial collection or mass lesion/mass effect. Vascular: No hyperdense vessel or unexpected calcification. Skull: No skull fracture.  Right frontal scalp hematoma. Other: None. CT MAXILLOFACIAL FINDINGS Osseous: No acute facial bone fracture. Possible remote nasal bone fracture. Zygomatic arches, mandibles, and pterygoid plates are intact. There is anterior subluxation of the a right temporomandibular joint. Orbits: No orbital fracture.  Both orbits and globes are intact. Sinuses: No fluid levels. Scattered mucosal thickening in the maxillary sinuses and ethmoid air cells. Soft tissues: Right frontal scalp hematoma. CT CERVICAL SPINE FINDINGS Alignment: Straightening of normal lordosis. No listhesis, jumped or perched facets. Lateral masses of C1 are well aligned on C2. Skull base and vertebrae: No acute fracture. Dens and skullbase are intact. Anterior fusion C5 through C7 with intact hardware. Non fusion posterior elements of C1, a normal variant. Soft tissues and spinal canal: No prevertebral fluid or swelling. No visible canal hematoma. Disc levels: Disc spacers at C5-C6 and C6-C7. Minimal endplate spurring at C4-C5. Upper  chest: No acute abnormality. Other: None. IMPRESSION: 1. Right frontal scalp hematoma. No skull fracture or acute intracranial abnormality. 2. No facial bone fracture. 3. Postsurgical change in the cervical spine with intact hardware. No fracture or acute subluxation. Electronically Signed   By: Rubye Oaks M.D.   On: 06/20/2016 23:10     Microbiology: Recent Results (from the past 240 hour(s))  Culture, Urine     Status: None   Collection Time: 06/21/16  9:30 AM  Result Value Ref Range Status   Specimen Description URINE, CLEAN CATCH  Final   Special Requests NONE  Final   Culture   Final    NO GROWTH Performed at [facility], [address] [place], [place] [address]    Report Status 06/22/2016 FINAL  Final     Labs: Basic Metabolic Panel:  Recent Labs Lab 06/20/16 2337 06/21/16 0528  NA 139 141  K 3.7 4.5  CL 105 107  CO2 25 28  GLUCOSE 115* 98  BUN 12 12  CREATININE 0.77 0.80  CALCIUM 9.2 9.5   Liver Function Tests:  Recent Labs Lab 06/21/16 0528  AST 18  ALT 21  ALKPHOS 69  BILITOT 1.2  PROT 6.9  ALBUMIN 4.0   No results for input(s): LIPASE, AMYLASE in the last 168 hours. No results for input(s): AMMONIA in the last 168 hours. CBC:  Recent Labs Lab 06/20/16 2337 06/21/16 0528  WBC 12.1* 10.2  NEUTROABS 8.7*  --   HGB 15.3 15.1  HCT 41.8 41.9  MCV 83.9 84.3  PLT 247 250   Cardiac Enzymes:  Recent Labs Lab 06/22/16 0540  CKTOTAL 126  CKMB 4.3   BNP: Invalid input(s): POCBNP CBG: No results for input(s): GLUCAP in the last 168 hours.  Time coordinating discharge:  Greater than 30 minutes  Signed:  Teodoro Jeffreys, DO [facility] Pager: [phone] 06/23/2016, 3:20 PM

## 2016-06-23 NOTE — Progress Notes (Addendum)
[facility]. 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 Alert, orient x4, speech slurred. Ecchymosis noted to mid forehead with edema, ecchymosis to bilat eye orbits. General weakness to all extremities. Bilat grips weak. Only able to lift RUE. Oriented to unit, reviewed POC and RH sched.Coralyn PearMaria Bailey, RN

## 2016-06-23 NOTE — Progress Notes (Signed)
[facility], report called and given to Reece Leaderebra Sharp RN . Family at bedside. Transported by [facility] awaiting facility.

## 2016-06-23 NOTE — Progress Notes (Signed)
Inpatient Rehabilitation  Case is [facility] being [facility] CM.  I await word from [facility] for hopeful [facility] Re[facility] authorization and potential admit later today.  I have updated Shanda BumpsJessica, RN CM.  Please call with questions.  Charlane FerrettiMelissa Shatia Sindoni, M.A., CCC/SLP Admission Coordinator  [facility]  Cell [phone]

## 2016-06-23 NOTE — Care Management (Signed)
Patient has been approved for CIR.   Bedside RN to call report to [phone] and call Melissa (CIR coordinator) at [phone] when patient is leaving  with [facility].   Accepting physician is : Faith RogueZachary Swartz  Going to [address].   Unit secretary arranging [facility] transport.

## 2016-06-23 NOTE — Progress Notes (Signed)
Occupational Therapy Treatment Patient Details Name: Steve Koch MRN: [id number] DOB: February 04, 1960 Today's Date: 06/23/2016    History of present illness 56 yo male with onset of injury to neck at C5-7 with fusion (03-09-16) after falling backward on a loading dock at work Oct 2017, now admitted with ninth fall since surgery.  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  He is at work with wife when not at therapy during the day.   OT comments  Pt received in bed, wife present for treatment. Pt performed bed mobility via log rolling to sit at EOB, wife practiced with pt, good technique. Pt then sat at EOB to wash face, able to wash left side of face using LUE,unable to cross midline to reach right side of face. Pt provided with right platform walker to try with functional mobility. Improved grasp with RUE using platform walker, pt able to weightbear through elbow and forearm with greater control compared to standard RW.  Pt continues to required min A +2 for functional mobility, no LOB this session, pt experienced BLE fatigue after approximately 45 ft today. Continue to recommend CIR on discharge.    Follow Up Recommendations  CIR    Equipment Recommendations  None recommended by OT       Precautions / Restrictions Precautions Precautions: Fall;Cervical Precaution Comments: No brace but still protective mobility Restrictions Weight Bearing Restrictions: No       Mobility Bed Mobility Overal bed mobility: Needs Assistance Bed Mobility: Supine to Sit     Supine to sit: Max assist (HOB flat)     General bed mobility comments: Pt completed log roll to initiate bed mobility for sitting at EOB. Able to use LUE to assist with pushing trunk into sitting  Transfers Overall transfer level: Needs assistance Equipment used: Right platform walker Transfers: Sit to/from Stand;Stand Pivot  Transfers Sit to Stand: Min assist;+2 physical assistance;From elevated surface Stand pivot transfers: Min assist;+2 physical assistance;+2 safety/equipment       General transfer comment: Pt rocks forward and counts to 3 to prepare for standing, on 3 is able to power up very well with min guard/min assist from PT and wife.  Pt requires elevated surface to stand        ADL either performed or assessed with clinical judgement   ADL       Grooming: Wash/dry face;Minimal assistance;Sitting Grooming Details (indicate cue type and reason): Pt able to use LUE to wash left side of face, unable to reach across midline to wash right side.              Lower Body Dressing: Moderate assistance;Sitting/lateral leans;Total assistance;Sit to/from stand Lower Body Dressing Details (indicate cue type and reason): Pt able to push LE's into shoes when feet positioned into shoe             Functional mobility during ADLs: Minimal assistance;+2 for physical assistance;Cueing for safety;Cueing for sequencing;Rolling walker (platform walker on right side)                 Cognition Arousal/Alertness: Awake/alert Behavior During Therapy: WFL for tasks assessed/performed Overall Cognitive Status: Within Functional Limits for tasks assessed  Exercises Exercises: General Upper Extremity General Exercises - Upper Extremity Shoulder Flexion: PROM;Both;10 reps Shoulder ABduction: PROM;Both;10 reps Shoulder ADduction: PROM;Both;10 reps Elbow Flexion: PROM;Both;10 reps Elbow Extension: PROM;Both;10 reps Wrist Flexion: PROM;Both;10 reps Wrist Extension: PROM;Both;10 reps           Pertinent Vitals/ Pain       Pain Assessment: No/denies pain         Frequency  Min 2X/week        Progress Toward Goals  OT Goals(current goals can now be found in the care plan section)  Progress towards OT goals: Progressing toward  goals  Acute Rehab OT Goals Patient Stated Goal: To regain strength and be independent again OT Goal Formulation: With patient Time For Goal Achievement: 07/06/16 Potential to Achieve Goals: Good ADL Goals Pt Will Perform Grooming: with set-up;sitting Pt Will Transfer to Toilet: with min assist;ambulating;regular height toilet Additional ADL Goal #1: Pt will improve bed mobility by 25%, caregiver providing mod assist.   Plan Discharge plan remains appropriate       AM-PAC PT "6 Clicks" Daily Activity     Outcome Measure   Help from another person eating meals?: A Lot Help from another person taking care of personal grooming?: A Lot Help from another person toileting, which includes using toliet, bedpan, or urinal?: Total Help from another person bathing (including washing, rinsing, drying)?: Total Help from another person to put on and taking off regular upper body clothing?: A Lot Help from another person to put on and taking off regular lower body clothing?: A Lot 6 Click Score: 10    End of Session Equipment Utilized During Treatment: Gait belt (right platform walker)  OT Visit Diagnosis: Repeated falls (R29.6);Muscle weakness (generalized) (M62.81);Other symptoms and signs involving the nervous system (R29.898)   Activity Tolerance Patient tolerated treatment well   Patient Left in chair;with call bell/phone within reach;with family/visitor present           Time: 0820-0910 OT Time Calculation (min): 50 min  Charges: OT General Charges $OT Visit: 1 Procedure OT Treatments $Therapeutic Activity: 38-52 mins   Ezra Sites, OTR/L  [phone] 06/23/2016, 9:42 AM

## 2016-06-23 NOTE — Progress Notes (Signed)
Physical Therapy Treatment Patient Details Name: Steve Koch MRN: [id number] DOB: August 28, 1960 Today's Date: 06/23/2016    History of Present Illness 56 yo male with onset of injury to neck at C5-7 with fusion (03-09-16) after falling backward on a loading dock at work Oct 2017, now admitted with ninth fall since surgery.  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  He is at work with wife when not at therapy during the day.    PT Comments    Pt tolerating treatment session well, with focus on trunk strength and stability during basic functional movements as well as corrected posturing with STS transfers to improve involvement of core and hips. Pt responds well to cues, both verbal and tactile, with good effort and motivation to perform as directed. AMB continues to require modA assist for weight shifting, but Rt platform walker is relatively new and still unfamiliar to patient. Pt informed of acceptance to CIR and DC orders during session, hence AMB is limited as patient would like to shower prior to arrival of transport service for DC. Pt making progress toward goals overall, AEB improved mechanics during transfers and familiarity with platform RW.   Follow Up Recommendations  CIR     Equipment Recommendations  Other (comment)    Recommendations for Other Services       Precautions / Restrictions Precautions Precautions: Fall;Cervical Required Braces or Orthoses: Other Brace/Splint Other Brace/Splint: RUE splint  Restrictions Weight Bearing Restrictions: No    Mobility  Bed Mobility Overal bed mobility:  (received up in chair)                Transfers Overall transfer level: Needs assistance Equipment used: Right platform walker;None Transfers: Sit to/from Stand;Stand Pivot Transfers Sit to Stand: Min assist;From elevated surface (perform 2x5, minA for forward trunk lean  guidance)         General transfer comment: modA to come to standing s AD, modA for weight shfits coordinated with VC for steps.   Ambulation/Gait Ambulation/Gait assistance: Mod assist Ambulation Distance (Feet): 10 Feet (recliner to shower, coordinated care with NA prior to DC. ) Assistive device: Rolling walker (2 wheeled);Right platform walker Gait Pattern/deviations: Step-to pattern;Decreased stride length;Wide base of support;Trunk flexed;Shuffle   Gait velocity interpretation: <1.8 ft/sec, indicative of risk for recurrent falls General Gait Details: flexed knees, requires some heavy assistance for weight shifts, and correction of retropulsion in standing x3; heavy cues for turn in BR for [facility]   Stairs            Wheelchair Mobility    Modified Rankin (Stroke Patients Only)       Balance Overall balance assessment: Needs assistance;History of Falls Sitting-balance support: No upper extremity supported;Feet supported Sitting balance-Leahy Scale: Good     Standing balance support: Bilateral upper extremity supported;During functional activity Standing balance-Leahy Scale: Poor                              Cognition Arousal/Alertness: Awake/alert Behavior During Therapy: WFL for tasks assessed/performed Overall Cognitive Status: Within Functional Limits for tasks assessed                                        Exercises General Exercises - Lower Extremity Long  [place];Both;20 reps;Seated (2x10, CGA-minA required, limited mostly by hamtrings spasticity. ) Hip Flexion/Marching: AAROM;Both;20 reps;Seated (2x10, mod-maxA required) Other Exercises Other Exercises: seated trunk flexion, reclined to upright sitting: 2x10, min-modA required with tactile cues for errect trunk c anter pelvic tilt Other Exercises: BUE reach to ground --> upright sitting: 2x10 MinA to support RUE.  Other Exercises: Sit to stand transfers, s  AD 2x5 c tactile/verbal cues for forward trunk lean    General Comments        Pertinent Vitals/Pain Pain Assessment: No/denies pain    Home Living                      Prior Function            PT Goals (current goals can now be found in the care plan section) Acute Rehab PT Goals Patient Stated Goal: To regain strength and be independent again PT Goal Formulation: With patient/family Time For Goal Achievement: 07/05/16 Potential to Achieve Goals: Good Progress towards PT goals: Progressing toward goals    Frequency    Min 3X/week      PT Plan Current plan remains appropriate    Co-evaluation              AM-PAC PT "6 Clicks" Daily Activity  Outcome Measure  Difficulty turning over in bed (including adjusting bedclothes, sheets and blankets)?: Total Difficulty moving from lying on back to sitting on the side of the bed? : Total Difficulty sitting down on and standing up from a chair with arms (e.g., wheelchair, bedside commode, etc,.)?: A Lot Help needed moving to and from a bed to chair (including a wheelchair)?: A Lot Help needed walking in hospital room?: A Lot Help needed climbing 3-5 steps with a railing? : Total 6 Click Score: 9    End of Session Equipment Utilized During Treatment: Gait belt Activity Tolerance: Patient tolerated treatment well;Other (comment) (limited by gross motor ataxia ) Patient left: with call bell/phone within reach;with family/visitor present;with nursing/sitter in room (on [facility] in shower c NA)   PT Visit Diagnosis: Unsteadiness on feet (R26.81);Repeated falls (R29.6);Muscle weakness (generalized) (M62.81)     Time: 7829-5621 PT Time Calculation (min) (ACUTE ONLY): 45 min  Charges:  $Gait Training: 8-22 mins $Therapeutic Exercise: 8-22 mins $Therapeutic Activity: 8-22 mins                    G Codes:       3:54 PM, July 04, 2016 Rosamaria Lints, PT, DPT Physical Therapist - [place] [phone] (Office)    Buccola,Allan C July 04, 2016, 3:51 PM

## 2016-06-24 ENCOUNTER — Inpatient Hospital Stay (HOSPITAL_COMMUNITY): Payer: BLUE CROSS/BLUE SHIELD | Admitting: Physical Therapy

## 2016-06-24 ENCOUNTER — Inpatient Hospital Stay (HOSPITAL_COMMUNITY): Payer: BLUE CROSS/BLUE SHIELD | Admitting: Occupational Therapy

## 2016-06-24 ENCOUNTER — Inpatient Hospital Stay (HOSPITAL_COMMUNITY): Payer: Self-pay | Admitting: Speech Pathology

## 2016-06-24 DIAGNOSIS — G8250 Quadriplegia, unspecified: Principal | ICD-10-CM

## 2016-06-24 DIAGNOSIS — E538 Deficiency of other specified B group vitamins: Secondary | ICD-10-CM

## 2016-06-24 DIAGNOSIS — E46 Unspecified protein-calorie malnutrition: Secondary | ICD-10-CM

## 2016-06-24 DIAGNOSIS — G959 Disease of spinal cord, unspecified: Secondary | ICD-10-CM

## 2016-06-24 DIAGNOSIS — I10 Essential (primary) hypertension: Secondary | ICD-10-CM

## 2016-06-24 DIAGNOSIS — F411 Generalized anxiety disorder: Secondary | ICD-10-CM

## 2016-06-24 LAB — CBC WITH DIFFERENTIAL/PLATELET
BASOS ABS: 0 10*3/uL (ref 0.0–0.1)
BASOS PCT: 0 %
Eosinophils Absolute: 0.2 10*3/uL (ref 0.0–0.7)
Eosinophils Relative: 2 %
HEMATOCRIT: 41.4 % (ref 39.0–52.0)
HEMOGLOBIN: 14.6 g/dL (ref 13.0–17.0)
Lymphocytes Relative: 32 %
Lymphs Abs: 3.3 10*3/uL (ref 0.7–4.0)
MCH: 29.7 pg (ref 26.0–34.0)
MCHC: 35.3 g/dL (ref 30.0–36.0)
MCV: 84.3 fL (ref 78.0–100.0)
MONOS PCT: 8 %
Monocytes Absolute: 0.9 10*3/uL (ref 0.1–1.0)
NEUTROS ABS: 6.1 10*3/uL (ref 1.7–7.7)
NEUTROS PCT: 58 %
Platelets: 269 10*3/uL (ref 150–400)
RBC: 4.91 MIL/uL (ref 4.22–5.81)
RDW: 14.2 % (ref 11.5–15.5)
WBC: 10.5 10*3/uL (ref 4.0–10.5)

## 2016-06-24 LAB — COMPREHENSIVE METABOLIC PANEL
ALBUMIN: 3.4 g/dL — AB (ref 3.5–5.0)
ALK PHOS: 65 U/L (ref 38–126)
ALT: 24 U/L (ref 17–63)
AST: 22 U/L (ref 15–41)
Anion gap: 9 (ref 5–15)
BILIRUBIN TOTAL: 1 mg/dL (ref 0.3–1.2)
BUN: 12 mg/dL (ref 6–20)
CO2: 26 mmol/L (ref 22–32)
Calcium: 9.1 mg/dL (ref 8.9–10.3)
Chloride: 105 mmol/L (ref 101–111)
Creatinine, Ser: 1 mg/dL (ref 0.61–1.24)
GFR calc Af Amer: >60 mL/min (ref >60)
GFR calc non Af Amer: >60 mL/min (ref >60)
GLUCOSE: 99 mg/dL (ref 65–99)
Potassium: 4.6 mmol/L (ref 3.5–5.1)
Sodium: 140 mmol/L (ref 135–145)
TOTAL PROTEIN: 6.4 g/dL — AB (ref 6.5–8.1)

## 2016-06-24 MED ORDER — PRO-STAT SUGAR FREE PO LIQD
30.0000 mL | Freq: Two times a day (BID) | ORAL | Status: DC
  Administered 2016-06-24 – 2016-07-15 (×42): 30 mL via ORAL
  Filled 2016-06-24 (×43): qty 30

## 2016-06-24 NOTE — Evaluation (Signed)
Occupational Therapy Assessment and Plan  Patient Details  Name: Steve Koch MRN: [id number] Date of Birth: 1960/03/02  OT Diagnosis: muscle weakness (generalized) and decreased sensation, coordination disorder Rehab Potential: Rehab Potential (ACUTE ONLY): Good ELOS: 17-21 days   Today's Date: 06/24/2016 OT Individual Time: 0903-1000 OT Individual Time Calculation (min): 57 min     Problem List:  Patient Active Problem List   Diagnosis Date Noted  . Benign essential HTN   . Spastic quadriparesis ([place])   . Vitamin B 12 deficiency   . Generalized anxiety disorder   . Hypoalbuminemia due to protein-calorie malnutrition ([place])   . Atrophy of muscle of both hands   . Fall 06/21/2016  . Cervical myelopathy ([place]) 06/21/2016  . Essential hypertension 06/21/2016  . Sensory disturbance 06/21/2016  . Gait instability 06/21/2016  . Hyperreflexia   . Poor tolerance for ambulation   . Myelopathy ([place]) 03/09/2016    Past Medical History:  Past Medical History:  Diagnosis Date  . Anxiety    situational with injury  . Carpal tunnel syndrome    right hand  . GERD (gastroesophageal reflux disease)   . Hypertension   . PONV (postoperative nausea and vomiting)    Past Surgical History:  Past Surgical History:  Procedure Laterality Date  . ANTERIOR CERVICAL DECOMP/DISCECTOMY FUSION N/A 03/09/2016   Procedure: Anterior Cervical Discectomy Fusion Cervial 5-7;  Surgeon: Melina Schools, MD;  Location: [facility];  Service: Orthopedics;  Laterality: N/A;  Requests for 3.5 hrs  . BACK SURGERY    . COLONOSCOPY    . HAND SURGERY Left   . HERNIA REPAIR Right    inguinal  . KNEE ARTHROSCOPY Right     Assessment & Plan Clinical Impression: Patient is a 56 y.o. 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 History taken from chart review and patient. He underwent ACDF C5-C7 02/2016 and has had  progressive weakness BLE.  MRI 6/1 done revealing stable ACDF with persistent abutment to right ventral cord due to osteophytes C5/6, no definite cord signal abnormality and T2-T3 left focal paracentral disc protrusion. He has had difficulty walking and sustained a fall and struck his head and was admitted on [facility] 06/20/16 for work up. CT head negative. Mild dysarthria of questionable etiology. Neurology recommended EMG/NCS for work up--done recently and results unavailable. Vitamin B 12 levels--228 and supplement added.  HIV- NR.  CK and TSH levels WNL. Acetylcholine AB pending.  Intensive therapy recommended due to cervical myelopathy with deficits in mobility as well as inability to carry out ADL tasks. CIR consulted and patient felt to be a good candidate. Patient transferred to CIR on 06/23/2016 .    Patient currently requires max- total A with basic self-care skills secondary to muscle weakness, decreased cardiorespiratoy endurance, decreased coordination and decreased motor planning and decreased sitting balance, decreased standing balance and decreased balance strategies.  Prior to hospitalization, patient could complete ADLs with modified independent .  Patient will benefit from skilled intervention to decrease level of assist with basic self-care skills prior to discharge home with care partner.  Anticipate patient will require 24 hour supervision and minimal physical assistance and follow up home health.  OT - End of Session Activity Tolerance: Decreased this session Endurance Deficit: Yes Endurance Deficit Description: 2/2 decreased cardiopulmonary endurance OT Assessment Rehab Potential (ACUTE ONLY): Good OT Patient demonstrates impairments in the following area(s): Balance;Endurance;Motor;Pain;Safety OT  Basic ADL's Functional Problem(s): Eating;Grooming;Bathing;Dressing;Toileting OT Transfers Functional Problem(s): Toilet;Tub/Shower OT Additional Impairment(s): Fuctional Use of Upper  Extremity OT Plan OT Intensity: Minimum of 1-2 x/day, 45 to 90 minutes OT Frequency: 5 out of 7 days OT Duration/Estimated Length of Stay: 17-21 days OT Treatment/Interventions: Medical illustrator training;Community reintegration;Discharge planning;Functional mobility training;Psychosocial support;Therapeutic Activities;UE/LE Coordination activities;Patient/family education;DME/adaptive equipment instruction;Pain management;UE/LE Strength taining/ROM;Therapeutic Exercise;Wheelchair propulsion/positioning;Self Care/advanced ADL retraining;Neuromuscular re-education OT Self Feeding Anticipated Outcome(s): set up OT Basic Self-Care Anticipated Outcome(s): min A OT Toileting Anticipated Outcome(s): min A OT Bathroom Transfers Anticipated Outcome(s): min A OT Recommendation Recommendations for Other Services: Therapeutic Recreation consult Therapeutic Recreation Interventions: Pet therapy Patient destination: Home Follow Up Recommendations: Home health OT Equipment Recommended: To be determined   Skilled Therapeutic Intervention Upon entering the room, pt supine in bed with no c/o pain this session. OT educated pt on OT purpose, POC, goals, and inpatient rehab expectations with pt verbalizing understanding and agreement. Pt performed supine>sit with max A to EOB. Pt standing from standard bed height with max A with focus on anterior weight shift and pt counting to gain momentum for stand. Pt standing and holding onto therapist arms while taking steps to transfer into recliner chair with mod A. Pt seated in chair at sink for grooming task. Pt required set up A and use of L UE to bring toothbrush to mouth. Pt needing assistance to fully wash face as he was unable to reach. Pt remaining in recliner chair with call bell and all needed items within reach upon exiting the room.   OT Evaluation Precautions/Restrictions  Precautions Precautions: Fall;Cervical Precaution Comments: No brace but still  protective mobility Required Braces or Orthoses: Other Brace/Splint Other Brace/Splint: RUE resting hand splint at night. Has dynamic splint from outpatient as well. Wears compression on R hand Restrictions Weight Bearing Restrictions: No  Pain Pain Assessment Pain Assessment: No/denies pain Home Living/Prior Functioning Home Living Available Help at Discharge: Family, Available 24 hours/day Type of Home: House Home Access: Stairs to enter Technical brewer of Steps: 4 Entrance Stairs-Rails: None Home Layout: One level Bathroom Shower/Tub: Multimedia programmer: Programmer, systems: Yes  Lives With: Spouse Prior Function Level of Independence: Needs assistance with ADLs, Needs assistance with gait Vision Baseline Vision/History: No visual deficits Patient Visual Report: No change from baseline Vision Assessment?: No apparent visual deficits Cognition Overall Cognitive Status: Impaired/Different from baseline Arousal/Alertness: Awake/alert Orientation Level: Person;Place;Situation Person: Oriented Place: Oriented Situation: Oriented Year: 2018 Month: June Day of Week: Correct Immediate Memory Recall: Sock;Blue;Bed Memory Recall: Sock;Blue;Bed Memory Recall Sock: Without Cue Memory Recall Blue: Without Cue Memory Recall Bed: Without Cue Sensation Sensation Light Touch: Appears Intact Coordination Gross Motor Movements are Fluid and Coordinated: No Fine Motor Movements are Fluid and Coordinated: No Motor  Motor Motor:  (general weakness overall) Motor - Skilled Clinical Observations: generalized weakness Mobility  Bed Mobility Bed Mobility: Sit to Supine;Sit to Sidelying Left Sit to Sidelying Left: 2: Max assist Sit to Sidelying Left Details: Tactile cues for sequencing;Tactile cues for placement;Tactile cues for weight shifting;Tactile cues for posture;Manual facilitation for weight bearing;Manual facilitation for placement;Verbal cues  for technique  Trunk/Postural Assessment  Postural Control Postural Control: Deficits on evaluation  Balance Balance Balance Assessed: Yes Static Sitting Balance Static Sitting - Balance Support: No upper extremity supported Static Sitting - Level of Assistance: 5: Stand by assistance Extremity/Trunk Assessment RUE Assessment RUE Assessment: Exceptions to WFL (limited ROM, edema present) LUE Assessment LUE Assessment: Exceptions to [facility] (2-/5 shoulder elevation)  See Function Navigator for Current Functional Status.   Refer to Care Plan for Long Term Goals  Recommendations for other services: Neuropsych and Therapeutic Recreation  Pet therapy   Discharge Criteria: Patient will be discharged from OT if patient refuses treatment 3 consecutive times without medical reason, if treatment goals not met, if there is a change in medical status, if patient makes no progress towards goals or if patient is discharged from hospital.  The above assessment, treatment plan, treatment alternatives and goals were discussed and mutually agreed upon: by patient  Gypsy Decant 06/24/2016, 11:46 AM

## 2016-06-24 NOTE — Evaluation (Signed)
Speech Language Pathology Assessment and Plan  Patient Details  Name: Steve Koch MRN: [id number] Date of Birth: January 10, 1961  SLP Diagnosis: Dysphagia;Dysarthria  Rehab Potential: Good ELOS: 14-21 days    Today's Date: 06/24/2016 SLP Individual Time: 1000-1100 SLP Individual Time Calculation (min): 60 min   Problem List:  Patient Active Problem List   Diagnosis Date Noted  . Benign essential HTN   . Spastic quadriparesis ([place])   . Vitamin B 12 deficiency   . Generalized anxiety disorder   . Hypoalbuminemia due to protein-calorie malnutrition ([place])   . Atrophy of muscle of both hands   . Fall 06/21/2016  . Cervical myelopathy ([place]) 06/21/2016  . Essential hypertension 06/21/2016  . Sensory disturbance 06/21/2016  . Gait instability 06/21/2016  . Hyperreflexia   . Poor tolerance for ambulation   . Myelopathy ([place]) 03/09/2016   Past Medical History:  Past Medical History:  Diagnosis Date  . Anxiety    situational with injury  . Carpal tunnel syndrome    right hand  . GERD (gastroesophageal reflux disease)   . Hypertension   . PONV (postoperative nausea and vomiting)    Past Surgical History:  Past Surgical History:  Procedure Laterality Date  . ANTERIOR CERVICAL DECOMP/DISCECTOMY FUSION N/A 03/09/2016   Procedure: Anterior Cervical Discectomy Fusion Cervial 5-7;  Surgeon: Melina Schools, MD;  Location: [facility];  Service: Orthopedics;  Laterality: N/A;  Requests for 3.5 hrs  . BACK SURGERY    . COLONOSCOPY    . HAND SURGERY Left   . HERNIA REPAIR Right    inguinal  . 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 History taken from chart review and patient. He underwent ACDF C5-C7 02/2016 and has had progressive weakness BLE.  MRI 6/1 done revealing stable ACDF with  persistent abutment to right ventral cord due to osteophytes C5/6, no definite cord signal abnormality and T2-T3 left focal paracentral disc protrusion. He has had difficulty walking and sustained a fall and struck his head and was admitted on [facility]/20/16 for work up. CT head negative. Mild dysarthria of questionable etiology. Neurology recommended EMG/NCS for work up--done recently and results unavailable. Vitamin B 12 levels--228 and supplement added.  HIV- NR.  CK and TSH levels WNL. Acetylcholine AB pending.  Intensive therapy recommended due to cervical myelopathy with deficits in mobility as well as inability to carry out ADL tasks. CIR consulted and patient felt to be a good candidate. SLP evaluation was completed on 06/24/2016 with the following results: Pt presents with a moderate dysarthria resulting from generalized oral motor weakness and decreased vocal intensity which impacts his articulatory precision and results in decreased speech intelligibility at the conversational level.  Given the extent of pt's oral motor weakness and its impact on pt's speech, a bedside swallow evaluation was also administered.  Pt reports ongoing, intermittent difficulty swallowing solids since ACDF in February (which also happens to be around the time pt began to notice speech changes).  No overt s/s of aspiration were evident during today's evaluation but pt would benefit from additional ST follow up for dysphagia to maximize pt's potential for safety with swallowing.   As a result, pt would benefit from ST follow up while inpatient in order to maximize functional independence and reduce burden of care prior  to discharge.  Anticipate that pt will need outpatient ST follow up at next level of care to address speech and swallowing.     Skilled Therapeutic Interventions          Cognitive-linguistic and bedside swallow evaluation completed with results and recommendations reviewed with family.  SLP discussed safe  swallowing strategies for pt's reported symptoms of difficulty swallowing solids, including alternating solids and liquids and sitting upright and out of bed during meals.  SLP also provided skilled education regarding intelligibility strategies, emphasizing overarticulation and increased vocal intensity.  A handout was posted in pt's room to maximize carryover in between therapy sessions.       SLP Assessment  Patient will need skilled Speech Lanaguage Pathology Services during CIR admission    Recommendations  SLP Diet Recommendations: Age appropriate regular solids;Thin Liquid Administration via: Cup;Straw Medication Administration: Whole meds with liquid Supervision: Patient able to self feed;Staff to assist with self feeding;Intermittent supervision to cue for compensatory strategies Compensations: Follow solids with liquid Postural Changes and/or Swallow Maneuvers: Out of bed for meals;Upright 30-60 min after meal;Seated upright 90 degrees Oral Care Recommendations: Oral care BID Patient destination: Home Follow up Recommendations: Outpatient SLP Equipment Recommended: None recommended by SLP    SLP Frequency 3 to 5 out of 7 days   SLP Duration  SLP Intensity  SLP Treatment/Interventions 14-21 days  Minumum of 1-2 x/day, 30 to 90 minutes  Cueing hierarchy;Dysphagia/aspiration precaution training;Environmental controls;Functional tasks;Internal/external aids;Speech/Language facilitation;Patient/family education    Pain Pain Assessment Pain Assessment: No/denies pain  Prior Functioning Cognitive/Linguistic Baseline: Within functional limits Type of Home: House  Lives With: Spouse Available Help at Discharge: Family;Available 24 hours/day  Function:  Eating Eating   Modified Consistency Diet: No Eating Assist Level: Help managing cup/glass;Help with picking up utensils;Helper scoops food on Ronald on Utensil: Occasionally      Cognition Comprehension Comprehension assist level: Follows complex conversation/direction with no assist  Expression   Expression assist level: Expresses basic 75 - 89% of the time/requires cueing 10 - 24% of the time. Needs helper to occlude trach/needs to repeat words.  Social Interaction Social Interaction assist level: Interacts appropriately with others - No medications needed.  Problem Solving Problem solving assist level: Solves complex problems: Recognizes & self-corrects  Memory Memory assist level: Complete Independence: No helper   Short Term Goals: Week 1: SLP Short Term Goal 1 (Week 1): Pt will consume regular textures and thin liquids with mod I use of swallowing precautions and minimal overt s/s of aspiration over 3 consecutive sessions.  SLP Short Term Goal 2 (Week 1): Pt will achieve intelligibilty at the conversational level with supervision verbal cues for increased vocal intensity and overarticulation.   Refer to Care Plan for Long Term Goals  Recommendations for other services: None   Discharge Criteria: Patient will be discharged from SLP if patient refuses treatment 3 consecutive times without medical reason, if treatment goals not met, if there is a change in medical status, if patient makes no progress towards goals or if patient is discharged from hospital.  The above assessment, treatment plan, treatment alternatives and goals were discussed and mutually agreed upon: by patient  Emilio Math 06/24/2016, 12:13 PM

## 2016-06-24 NOTE — Evaluation (Addendum)
Physical Therapy Assessment and Plan  Patient Details  Name: Steve Koch MRN: [id number] Date of Birth: 22-May-1960  PT Diagnosis: Abnormality of gait, Coordination disorder, Difficulty walking, Impaired sensation and Muscle weakness Rehab Potential: Good ELOS: 3 weeks   Today's Date: 06/24/2016 PT Individual Time: 1884-1660 PT Individual Time Calculation (min): 78 min    Problem List:  Patient Active Problem List   Diagnosis Date Noted  . Benign essential HTN   . Spastic quadriparesis ([place])   . Vitamin B 12 deficiency   . Generalized anxiety disorder   . Hypoalbuminemia due to protein-calorie malnutrition ([place])   . Atrophy of muscle of both hands   . Fall 06/21/2016  . Cervical myelopathy ([place]) 06/21/2016  . Essential hypertension 06/21/2016  . Sensory disturbance 06/21/2016  . Gait instability 06/21/2016  . Hyperreflexia   . Poor tolerance for ambulation   . Myelopathy ([place]) 03/09/2016    Past Medical History:  Past Medical History:  Diagnosis Date  . Anxiety    situational with injury  . Carpal tunnel syndrome    right hand  . GERD (gastroesophageal reflux disease)   . Hypertension   . PONV (postoperative nausea and vomiting)    Past Surgical History:  Past Surgical History:  Procedure Laterality Date  . ANTERIOR CERVICAL DECOMP/DISCECTOMY FUSION N/A 03/09/2016   Procedure: Anterior Cervical Discectomy Fusion Cervial 5-7;  Surgeon: Melina Schools, MD;  Location: [facility];  Service: Orthopedics;  Laterality: N/A;  Requests for 3.5 hrs  . BACK SURGERY    . COLONOSCOPY    . HAND SURGERY Left   . HERNIA REPAIR Right    inguinal  . KNEE ARTHROSCOPY Right     Assessment & Plan Clinical Impression: Patient is a 56 y.o. 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 He underwent ACDF C5-C7 02/2016 and has had progressive weakness BLE.  MRI 6/1 done revealing stable ACDF  with persistent abutment to right ventral cord due to osteophytes C5/6, no definite cord signal abnormality and T2-T3 left focal paracentral disc protrusion. He has had difficulty walking and sustained a fall and struck his head and was admitted on [facility] 06/20/16 for work up. CT head negative. Mild dysarthria of questionable etiology. Neurology recommended EMG/NCS for work up--done recently and results unavailable. Vitamin B 12 levels--228 and supplement added.  HIV- NR.  CK and TSH levels WNL. Acetylcholine AB pending.  Intensive therapy recommended due to cervical myelopathy with deficits in mobility as well as inability to carry out ADL tasks. CIR consulted and patient felt to be a good candidate.  Patient transferred to CIR on 06/23/2016 .   Patient currently requires total with mobility secondary to muscle weakness, decreased cardiorespiratoy endurance, decreased coordination, and decreased sitting balance, decreased standing balance, decreased postural control, decreased balance strategies and difficulty maintaining precautions.  Prior to hospitalization, patient required assistance from his wife with all functional mobility tasks with use of a walker. Pt lived with Spouse ([place]) in a House home.  Home access is 2 steps + 1 stepRamped entrance, Stairs to enter (primarily uses ramp for home access).  Patient will benefit from skilled PT intervention to maximize safe functional mobility, minimize fall risk and decrease caregiver burden for planned discharge home with 24 hour assist.  Anticipate patient will benefit from follow up [place] vs OPPT at discharge.  PT - End of Session Activity Tolerance: Tolerates 30+ min  activity with multiple rests Endurance Deficit: Yes Endurance Deficit Description: 2/2 decreased cardiopulmonary endurance PT Assessment Rehab Potential (ACUTE/IP ONLY): Good PT Patient demonstrates impairments in the following area(s): Balance;Sensory;Endurance;Motor;Safety PT Transfers  Functional Problem(s): Bed Mobility;Bed to Chair;Car;Furniture PT Locomotion Functional Problem(s): Ambulation;Wheelchair Mobility;Stairs PT Plan PT Intensity: Minimum of 1-2 x/day ,45 to 90 minutes PT Frequency: 5 out of 7 days PT Duration Estimated Length of Stay: 3 weeks PT Treatment/Interventions: Ambulation/gait training;Community reintegration;DME/adaptive equipment instruction;Neuromuscular re-education;Psychosocial support;Stair training;UE/LE Strength taining/ROM;Wheelchair propulsion/positioning;Balance/vestibular training;Discharge planning;Pain management;Functional electrical stimulation;Skin care/wound management;Therapeutic Activities;UE/LE Coordination activities;Disease management/prevention;Functional mobility training;Patient/family education;Splinting/orthotics;Therapeutic Exercise;Visual/perceptual remediation/compensation PT Transfers Anticipated Outcome(s): min assist PT Locomotion Anticipated Outcome(s): min assist PT Recommendation Recommendations for Other Services: Neuropsych consult;Therapeutic Recreation consult Therapeutic Recreation Interventions: Pet therapy;Stress management Follow Up Recommendations: 24 hour supervision/assistance (HHPT vs OPPT) Patient destination: Home Equipment Recommended: To be determined  Skilled Therapeutic Intervention Pt received in recliner in room & agreeable to tx. PT evaluation initiated and therapist educated pt on weekly interdisciplinary team meeting and various CIR information. Obtained w/c and R PFRW for pt to use. Pt reported need to urinate and attempted to have pt transfer sit>stand with stedy lift but pt requires max for anterior weight shift and is unable to pull to standing with BUE or push with BLE; no movement noted. Therapist then provided total assist to allow pt to use urinal 2/2 pt's inability to hold/place urinal with hands. Transitioned to using Clarise Cruz Plus for sit<>stand transfer and pt transferred to w/c. Pt oriented  to unit then requested to get in bed. Utilized Sara Plus to transfer w/c>bed; once sitting EOB pt experience LOB to L and required max assist to correct - once in midline pt able to maintain position with supervision. Pt required max assist for sit>sidelying>supine. At end of session pt left in bed with alarm set, all needs within reach, & pancake call bell in lap.   Addendum: Pt educated on cervical precautions.  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 Family/Caregiver Present: No   Pain Pain Assessment Pain Assessment: No/denies pain  Home Living/Prior Functioning Home Living Available Help at Discharge: Available 24 hours/day (pt goes to work with Arrie Aran to allow her to provide 24 hr supervision) Type of Home: House Home Access: Ramped entrance;Stairs to enter (primarily uses ramp for home access) Entrance Stairs-Number of Steps: 2 steps + 1 step Entrance Stairs-Rails:  (can be on either side) Home Layout: One level Bathroom Shower/Tub: Multimedia programmer: Standard Bathroom Accessibility: Yes  Lives With: Spouse Engineer, manufacturing systems) Prior Function Level of Independence: Needs assistance with ADLs;Needs assistance with homemaking;Needs assistance with gait;Needs assistance with tranfers  Able to Take Stairs?: Yes (with assistance) Driving: No Leisure: Hobbies-yes (Comment) Comments: being outside, mowing the yard, golf  Vision/Perception  Pt reports wearing reading glasses. Pt denies any  changes in baseline vision.  Cognition Overall Cognitive Status: Impaired/Different from baseline Arousal/Alertness: Awake/alert Orientation Level: Oriented X4  Sensation Sensation Light Touch:  (numbness in great & 2nd toe on B feet)   Motor  Motor Motor:  (general weakness overall) Motor - Skilled Clinical Observations: BUE  very weak    Mobility Bed Mobility Bed Mobility: Sit > Sidelying > Supine Sit > Sidelying > Supine : 2: Max assist Details: Tactile cues for sequencing;Tactile cues for placement;Tactile cues for posture;Tactile cues for weight shifting;Verbal cues for technique;Manual facilitation for weight bearing;Manual facilitation for placement Transfers Transfers: Yes Transfer via Lift Equipment: Clinical biochemist Ambulation: No Gait Gait: No Stairs / Additional Locomotion Stairs: No Architect: No   Trunk/Postural Assessment  Postural Control Postural Control: Deficits on evaluation   Balance Balance Balance Assessed: Yes Static Sitting Balance Static Sitting - Balance Support: No upper extremity supported Static Sitting - Level of Assistance: 5: Stand by assistance (once able to find midline)   See Function Navigator for Current Functional Status.   Refer to Care Plan for Long Term Goals  Recommendations for other services: Neuropsych and Therapeutic Recreation  Pet therapy and Stress management  Discharge Criteria: Patient will be discharged from PT if patient refuses treatment 3 consecutive times without medical reason, if treatment goals not met, if there is a change in medical status, if patient makes no progress towards goals or if patient is discharged from hospital.  The above assessment, treatment plan, treatment alternatives and goals were discussed and mutually agreed upon: by patient  Waunita Schooner 06/24/2016, 4:19 PM

## 2016-06-24 NOTE — Progress Notes (Signed)
06/24/16 1730 nursing RN was called to room re: pt has nosebleed. Patients nosebleed stopped ; RN cleaned rt nare with saline. No further bleeding noted.

## 2016-06-24 NOTE — Plan of Care (Signed)
Problem: RH Balance Goal: LTG Patient will maintain dynamic standing balance (PT) LTG:  Patient will maintain dynamic standing balance with assistance during mobility activities (PT) With LRAD  Problem: RH Bed to Chair Transfers Goal: LTG Patient will perform bed/chair transfers w/assist (PT) LTG: Patient will perform bed/chair transfers with assistance, with/without cues (PT). With LRAD  Problem: RH Car Transfers Goal: LTG Patient will perform car transfers with assist (PT) LTG: Patient will perform car transfers with assistance (PT). With LRAD  Problem: RH Ambulation Goal: LTG Patient will ambulate in controlled environment (PT) LTG: Patient will ambulate in a controlled environment, # of feet with assistance (PT). 25 ft with LRAD Goal: LTG Patient will ambulate in home environment (PT) LTG: Patient will ambulate in home environment, # of feet with assistance (PT). 25 ft with LRAD  Problem: RH Wheelchair Mobility Goal: LTG Patient will propel w/c in controlled environment (PT) LTG: Patient will propel wheelchair in controlled environment, # of feet with assist (PT) 100 ft Goal: LTG Patient will propel w/c in home environment (PT) LTG: Patient will propel wheelchair in home environment, # of feet with assistance (PT). 50 ft

## 2016-06-24 NOTE — Progress Notes (Signed)
Ranelle OysterSwartz, Zachary T, MD Physician Signed Physical Medicine and Rehabilitation  PMR Pre-admission Date of Service: 06/22/2016 9:45 PM  Related encounter: ED to Hosp-Admission (Discharged) from 06/20/2016 in [facility]       [] Hide copied text   Secondary Market PMR Admission Coordinator Pre-Admission Assessment  Patient: Steve Koch is an 56 y.o., male MRN: [id number] DOB: April 29, 1960 Height: 6\' 1"  (185.4 cm) Weight: 116.9 kg (257 lb 11.5 oz)  Insurance Information HMO:     PPO:      PCP:      IPA:      80/20:      OTHER: Blue [employer] Silver with [employer] PRIMARY: BCBS      Policy#: [id number]      Subscriber: Spouse CM Name: Ollen Grossllen Wilson      Phone#: [phone]     Fax#: [phone] Pre-Cert#: [id number]      Employer: Spouse Benefits:  Phone #: [phone]     Name: verified online 06/22/16 at 1200 Eff. Date: 10/17/15     Deduct: $2,000      Out of Pocket Max: $7,150      Life Max: N/A CIR: 70%/30%      SNF: 70%/30% Outpatient: 30 SLP visits, 30 PT/OT combined visits      Co-Pay: $90 per visit  Home Health: Following pre-authorization 70%      Co-Pay: 30% DME: 70%     Co-Pay: 30% Providers: in network   Medicaid Application Date:       Case Manager:  Disability Application Date:       Case Worker:   Emergency ActuaryContact Information        Contact Information    Name Relation Home Work Mobile   Bump,Dawn Spouse [phone]        Current Medical History  Patient Admitting Diagnosis: Gait impairment due to cervical myelopathy   History of Present Illness: Steve Koch a 56 y.o.male with history of GERD, fall at work with RUE>LUE weakness due to 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 He underwent ACDF C5-C7 02/2016 and has had progressive weakness BLE. MRI 6/1 done revealing stable ACDF with persistent abutment to right ventral cord due to osteophytes C5/6, no definite cord signal  abnormality and T2-T3 left focal paracentral disc protrusion. He has had difficulty walking and sustained a fall and struck his head and was [facility]20[facility]16 for work up. CT head negative. Mild dysarthria of questionable etiology. Neurology recommended EMG/NCS for work up.   [facility] has been reviewed by the rehabilitation admission coordinator and physician.   Past Medical History      Past Medical History:  Diagnosis Date  . Anxiety    situational with injury  . Carpal tunnel syndrome    right hand  . GERD (gastroesophageal reflux disease)   . Hypertension   . PONV (postoperative nausea and vomiting)     Family History   family history includes Cancer in his father; Diabetes in his father; Hypertension in his mother; Pneumonia in his mother.  Prior Rehab/Hospitalizations Has the patient had major surgery during 100 days prior to admission? Yes              Current Medications Please refer to AP MAR for full details Norvasc Asprin  Lioreseal Vitamin B-12 tablet and injection  Lovenox Motrin Ativan Zofran  Protonix  Baclofen  Patients Current Diet:  Regular textures and thin liquids  Precautions / Restrictions Precautions Precautions: Fall, Cervical Precaution Comments: No brace but still protective mobility Restrictions Weight Bearing Restrictions: No   Has the patient had 2 or more falls or a fall with injury in the past year?Yes  Prior Activity Level Community (5-7x/wk): Prior to admission paitent went out of the house daily.  He was participating in outpatient therapy at [facility].  He went to work with his wife daily so that she could care for him as well.  Patient with multiple falls and a gradual decline; however, prior to fall in 2017 he was fully independent and working full time.    Prior Functional Level Self Care: Did the patient need help bathing, dressing, using the  toilet or eating?  Needed some help  Indoor Mobility: Did the patient need assistance with walking from room to room (with or without device)? Needed some help  Stairs: Did the patient need assistance with internal or external stairs (with or without device)? Needed some help  Functional Cognition: Did the patient need help planning regular tasks such as shopping or remembering to take medications? Needed some help  Home Assistive Devices / Equipment Home Assistive Devices/Equipment: Dan Humphreys (specify type) Home Equipment: Walker - 2 wheels, Cane - single point, [employer], Grab bars - toilet, Grab bars - tub/shower  Prior Device Use: Indicate devices/aids used by the patient prior to current illness, exacerbation or injury? Ephraim Hamburger   Prior Functional Level Current Functional Level  Bed Mobility  Mod assist  Max assist   Transfers  Min assist  Min +2 assist    Mobility - Walk/Wheelchair  Min assist  Mod +2 assist    Upper Body Dressing  Mod assist  Max assist    Lower Body Dressing  Max assist  Mod-Total assist   Grooming  Min assist   Min assist    Eating/Drinking  Min assist   Min assist    Toilet Transfer  Min assist  Mod assist +2   Bladder Continence   Yes  Yes,urgency resulting in incontinence x1    Bowel Management  Continent   Continent 06/22/16   Stair Climbing  Min assist   Total assist    Communication  Per spouse, slurred speech when he is fatigued   No evaluation on record to assess for dysarthria    Memory  Independent   At baseline per spouse's report    Cooking/Meal Prep  Total assist       Housework  Total assist     Money Management  Spouse has always done this    Driving  No dependent on spouse      Special needs/care consideration BiPAP/CPAP: No CPM: No Continuous Drip IV: No Dialysis: No         Life Vest: No Oxygen: No Special Bed: No Trach Size:  No Wound Vac (area): No       Skin: Abrasion to nose and Ecchymosis to right and left side of face                         Bowel mgmt: 06/22/16 Continent  Bladder mgmt: Continent  Diabetic mgmt: No  Previous Home Environment Living Arrangements: Spouse/significant other Available Help at Discharge: Family, Available 24 hours/day Type of Home: House Home Layout: One level Home Access: Stairs to enter Entrance Stairs-Rails: None Secretary/administrator of Steps: 4 Bathroom Shower/Tub: Health visitor: Pharmacist, community: Yes Home Care Services: No  Discharge Living Setting Plans for Discharge Living Setting: Patient's home, Lives with (comment) (spouse) Type of Home at Discharge: House Discharge Home Layout: One level Discharge Home Access: Ramped entrance Discharge Bathroom Shower/Tub: Walk-in shower Discharge Bathroom Toilet: Standard Discharge Bathroom Accessibility: Yes How Accessible: Accessible via wheelchair, Accessible via walker (per wife's report ) Does the patient have any problems obtaining your medications?: No  Social/Family/Support Systems Patient Roles: Spouse Contact Information: Spouse Thor Nannini [phone] Anticipated Caregiver: Spouse Anticipated Caregiver's Contact Information: See above  Ability/Limitations of Caregiver: None Caregiver Availability: 24/7 Discharge Plan Discussed with Primary Caregiver: Yes Is Caregiver In Agreement with Plan?: Yes Does Caregiver/Family have Issues with Lodging/Transportation while Pt is in Rehab?: Yes (Home will be a good distance from CIR)  Goals/Additional Needs Patient/Family Goal for Rehab: PT/OT Supervision-Min A; SLP Supervision  Expected length of stay: 10-14 days Cultural Considerations: None Dietary Needs: Regular textures and thin liquids  Equipment Needs: TBD Special Service Needs: Patient was getting therapy at [facility] prior to admission Additional  Information: Patient and spouse attribute decline and deficits to initial fall at work in 2017; however, they denied IP Rehab, therefore their insurance is covering this stay and they have an attorney assisting with fighting workers comp claim Pt/Family Agrees to Admission and willing to participate: Yes Program Orientation Provided & Reviewed with Pt/Caregiver Including Roles  & Responsibilities: Yes Additional Information Needs: Neuro at [facility] has recommeded that patient recieve studies as an outpatient:  Information Needs to be Provided By: Team FYI  Patient Condition: I have reviewed patient's medical record and discussed case with patient and spouse over the phone.  They are eager to get inpatient rehab therapies to facilitate his safe transition home by reducing his fall risk.  Given that prior to 2017 patient was fully independent and working this decline and 9 reported falls has concerned them.  Patient requires daily medical management, 24/7 nursing care, and skilled intensive therapies in order to regain his independence and safely discharge home with his spouse's support.    Preadmission Screen Completed By:  Fae Pippin, 06/23/2016 10:23 AM ______________________________________________________________________   Discussed status with Dr. Riley Kill on 06/23/16 at 3pm and received telephone approval for admission today.  Admission Coordinator:  Fae Pippin, time 06/23/16/Date 3:23pm   Assessment/Plan: Diagnosis: gait disorder due to cervical myelopathy 1. Does the need for close, 24 hr/day  Medical supervision in concert with the patient's rehab needs make it unreasonable for this patient to be served in a less intensive setting? Yes 2. Co-Morbidities requiring supervision/potential complications: pain, GERD, CTS 3. Due to bladder management, bowel management, safety, skin/wound care, disease management, medication administration, pain management and patient education, does  the patient require 24 hr/day rehab nursing? Yes 4. Does the patient require coordinated care of a physician, rehab nurse, PT (1-2 hrs/day, 5 days/week) and OT (1-2 hrs/day, 5 days/week) to address physical and functional deficits in the context of the above medical diagnosis(es)? Yes Addressing deficits in the following areas: balance, endurance, locomotion, strength, transferring, bowel/bladder control, bathing, dressing, feeding, grooming, toileting and psychosocial support 5. Can the patient actively participate in an intensive therapy program of at least 3 hrs of therapy 5 days a week? Yes 6. The potential for patient to make measurable gains while on inpatient rehab is excellent 7. Anticipated functional outcomes upon discharge from inpatients are: supervision and min assist PT, supervision and min assist OT, n/a SLP 8. Estimated rehab length of stay to reach the above functional goals  is: 10-14 days 9. Does the patient have adequate social supports to accommodate these discharge functional goals? Yes 10. Anticipated D/C setting: Home 11. Anticipated post D/C treatments: HH therapy 12. Overall Rehab/Functional Prognosis: excellent    RECOMMENDATIONS: This patient's condition is appropriate for continued rehabilitative care in the following setting: CIR Patient has agreed to participate in recommended program. Yes Note that insurance prior authorization may be required for reimbursement for recommended care.  Comment: Admit to inpatient rehab today  Ranelle Oyster, MD, [facility] Physical Medicine & Rehabilitation 06/23/2016   Fae Pippin 06/23/2016    Revision History

## 2016-06-24 NOTE — H&P (Signed)
Physical Medicine and Rehabilitation Admission H&P    Chief Complaint  Patient presents with  . Fall    HPI: Steve Koch  is a 56 y.o. 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 History taken from chart review and patient. He underwent ACDF C5-C7 02/2016 and has had progressive weakness BLE.  MRI 6/1 done revealing stable ACDF with persistent abutment to right ventral cord due to osteophytes C5/6, no definite cord signal abnormality and T2-T3 left focal paracentral disc protrusion. He has had difficulty walking and sustained a fall and struck his head and was admitted on [facility] 06/20/16 for work up. CT head negative. Mild dysarthria of questionable etiology. Neurology recommended EMG/NCS for work up--done recently and results unavailable. Vitamin B 12 levels--228 and supplement added.  HIV- NR.  CK and TSH levels WNL. Acetylcholine AB pending.  Intensive therapy recommended due to cervical myelopathy with deficits in mobility as well as inability to carry out ADL tasks. CIR consulted and patient felt to be a good candidate.    Review of Systems  HENT: Positive for tinnitus (for years--since teenager ). Negative for hearing loss.   Eyes: Negative for blurred vision and double vision.  Respiratory: Negative for cough and shortness of breath.   Cardiovascular: Negative for chest pain.  Gastrointestinal: Positive for constipation and heartburn. Negative for nausea and vomiting.  Genitourinary: Negative for dysuria and urgency.  Musculoskeletal: Positive for falls (9 since surgery).  Skin: Negative for itching and rash.  Neurological: Positive for sensory change (in 1st and 2nd toes. ) and weakness. Negative for dizziness and headaches.  All other systems reviewed and are negative.     Past Medical History:  Diagnosis Date  . Anxiety    situational with injury  . Carpal tunnel syndrome    right  hand  . GERD (gastroesophageal reflux disease)   . Hypertension   . PONV (postoperative nausea and vomiting)     Past Surgical History:  Procedure Laterality Date  . ANTERIOR CERVICAL DECOMP/DISCECTOMY FUSION N/A 03/09/2016   Procedure: Anterior Cervical Discectomy Fusion Cervial 5-7;  Surgeon: Melina Schools, MD;  Location: [place];  Service: Orthopedics;  Laterality: N/A;  Requests for 3.5 hrs  . BACK SURGERY    . COLONOSCOPY    . HAND SURGERY Left   . HERNIA REPAIR Right    inguinal  . KNEE ARTHROSCOPY Right     Family History  Problem Relation Age of Onset  . Pneumonia Mother   . Hypertension Mother   . Diabetes Father   . Cancer Father     Social History:  Married. Wife has been assisting with ADL tasks and takes him to work to proivde supervision. reports that he quit smoking about 10 years ago. He has never used smokeless tobacco. He reports that he drinks alcohol on rare occasion.  He reports that he does not use drugs.    Allergies  Allergen Reactions  . Lyrica [Pregabalin] Swelling    Medications Prior to Admission  Medication Sig Dispense Refill  . amLODipine (NORVASC) 2.5 MG tablet Take 2.5 mg by mouth daily.   0  . aspirin EC 81 MG tablet Take 81 mg by mouth daily.    Marland Kitchen ibuprofen (ADVIL,MOTRIN) 200 MG tablet Take 400 mg by mouth every 6 (six) hours as needed.    Marland Kitchen omeprazole (PRILOSEC) 20 MG capsule Take 20 mg by mouth  daily.    . oxymetazoline (AFRIN) 0.05 % nasal spray Place 1 spray into both nostrils 2 (two) times daily as needed for congestion.      Home: Home Living Family/patient expects to be discharged to:: Private residence Living Arrangements: Spouse/significant other Available Help at Discharge: Family, Available 24 hours/day Type of Home: House Home Access: Stairs to enter [employer] of Steps: 4 Entrance Stairs-Rails: None Home Layout: One level Bathroom Shower/Tub: Multimedia programmer: Standard Bathroom Accessibility:  Yes Home Equipment: Environmental consultant - 2 wheels, Cane - single point, Shower seat, Grab bars - toilet, Grab bars - tub/shower   Functional History: Prior Function Level of Independence: Needs assistance Gait / Transfers Assistance Needed: Wife assists with functional mobility using RW, stands behind pt or to pt's right side (weaker side) ADL's / Homemaking Assistance Needed: Wife is assisting pt with B/IADL tasks and bed mobility, has been providing increased level of assistance for past 2-3 weeks  Functional Status:  Mobility: Bed Mobility Overal bed mobility: Needs Assistance Bed Mobility: Supine to Sit Supine to sit: Max assist, +2 for physical assistance, +2 for safety/equipment, HOB elevated Sit to supine: Mod assist, +2 for physical assistance, +2 for safety/equipment General bed mobility comments: Pt requires increased time for processing and sequencing, fair sitting balance while at EOB Transfers Overall transfer level: Needs assistance Equipment used: Rolling walker (2 wheeled) Transfers: Sit to/from Stand, Stand Pivot Transfers Sit to Stand: Min assist, +2 physical assistance, From elevated surface Stand pivot transfers: Min assist, +2 physical assistance, +2 safety/equipment General transfer comment: Pt rocks forward and counts to 3 to prepare for standing, on 3 is able to power up very well with min guard/min assist from OT and wife Ambulation/Gait Ambulation/Gait assistance: +2 physical assistance, +2 safety/equipment, Mod assist, Min assist Ambulation Distance (Feet): 4 Feet Assistive device: Rolling walker (2 wheeled) Gait Pattern/deviations: Step-to pattern, Decreased stride length, Wide base of support, Trunk flexed, Shuffle General Gait Details: weak sidesteppign with most difficulty lifting off the R leg to step esp laterally Gait velocity: reduced Gait velocity interpretation: Below normal speed for age/gender    ADL: ADL Overall ADL's : Needs  assistance/impaired Eating/Feeding: Set up, Minimal assistance, Sitting Eating/Feeding Details (indicate cue type and reason): Pt uses non-dominant left hand to eat due to RUE deficits. Wife assists if pt fatigued Grooming: Wash/dry face, Oral care, Set up, Minimal assistance, Sitting Grooming Details (indicate cue type and reason): Pt able to complete grooming tasks with non-dominant LUE with min assistance for grasping utensils and for control Upper Body Bathing: Maximal assistance, Sitting Upper Body Bathing Details (indicate cue type and reason): Pt requires max assist for bathing tasks due to BUE strength and coordination deficits and balance deficits Lower Body Bathing: Total assistance, Sitting/lateral leans Lower Body Bathing Details (indicate cue type and reason): Pt requires total assistance for LB bathing due to poor balance and BUE deficits Upper Body Dressing : Maximal assistance, Sitting Upper Body Dressing Details (indicate cue type and reason): Due to BUE strength and coordination deficits  Lower Body Dressing: Moderate assistance, Sitting/lateral leans, Total assistance, Sit to/from stand Lower Body Dressing Details (indicate cue type and reason): Pt able to reach down and pull pants up to knees with LUE while seated on BSC. Upon standing requires total assist for LB dressing. Pt able to push LE's into shoes when feet positioned into shoe Toilet Transfer: Moderate assistance, +2 for physical assistance, Cueing for safety, Cueing for sequencing, Stand-pivot, BSC Toilet Transfer Details (indicate  cue type and reason): Pt able to perform stand-pivot to [facility] with increased time, taking small side-steps to the left. Pt has max difficulty lifting RUE up to take a step. Pt able to reach back for handle of BSC with LUE Toileting- Clothing Manipulation and Hygiene: Maximal assistance, Sitting/lateral lean, Total assistance, Sit to/from stand Toileting - Clothing Manipulation Details (indicate  cue type and reason): Pt able to assist with LB clothing while seated at [facility], requires total assistance for hygiene tasks Tub/ Shower Transfer: Maximal assistance, Cueing for sequencing, Shower seat, Press photographer Details (indicate cue type and reason): Wife reports pt is very anxious about stepping into shower, requires max assist and increased time for mobility into shower Functional mobility during ADLs: Minimal assistance, +2 for physical assistance, Cueing for safety, Cueing for sequencing, Rolling walker General ADL Comments: Pt participation in ADL tasks has been declining over past few weeks due to strength and coordination deficits in BUE and BLE, as well as balance deficits  Cognition: Cognition Overall Cognitive Status: Within Functional Limits for tasks assessed Cognition Arousal/Alertness: Awake/alert Behavior During Therapy: WFL for tasks assessed/performed Overall Cognitive Status: Within Functional Limits for tasks assessed   Blood pressure 114/74, pulse 73, temperature 97.8 F (36.6 C), temperature source Oral, resp. rate 20, height '6\' 1"'$  (1.854 m), weight 116.9 kg (257 lb 11.5 oz), SpO2 96 %. Physical Exam  Nursing note and vitals reviewed. Constitutional: He is oriented to person, place, and time. He appears well-developed and well-nourished.  HENT:  Head: Normocephalic and atraumatic.  Mouth/Throat: Oropharynx is clear and moist.  Eyes: Conjunctivae and EOM are normal. Pupils are equal, round, and reactive to light.  Neck: Normal range of motion. Neck supple.  Cardiovascular: Normal rate and regular rhythm.   Respiratory: Effort normal and breath sounds normal. No stridor. No respiratory distress. He has no wheezes.  GI: Soft. Bowel sounds are normal. He exhibits no distension. There is no tenderness.  Musculoskeletal: He exhibits edema. He exhibits no tenderness.  Min edema bilateral hands and feet. Tight heel cords on right.   Neurological: He  is alert and oriented to person, place, and time. He displays abnormal reflex. He exhibits abnormal muscle tone. Coordination abnormal.  Slow moderately dysarthric speech.  Tetraplegia with flexor tone BLE.  Motor: RUE: 0/5 LUE: Shoulder abduction, elbow flexion/extension 4/5, wrist, hand grip 2+/5  Skin: Skin is warm and dry.  Psychiatric: His mood appears anxious. His speech is slurred. He is slowed. Cognition and memory are not impaired.    Results for orders placed or performed during the hospital encounter of 06/20/16 (from the past 48 hour(s))  CBC with Differential/Platelet     Status: Abnormal   Collection Time: 06/20/16 11:37 PM  Result Value Ref Range   WBC 12.1 (H) 4.0 - 10.5 K/uL   RBC 4.98 4.22 - 5.81 MIL/uL   Hemoglobin 15.3 13.0 - 17.0 g/dL   HCT 41.8 39.0 - 52.0 %   MCV 83.9 78.0 - 100.0 fL   MCH 30.7 26.0 - 34.0 pg   MCHC 36.3 (H) 30.0 - 36.0 g/dL   RDW 14.2 11.5 - 15.5 %   Platelets 247 150 - 400 K/uL   Neutrophils Relative % 72 %   Neutro Abs 8.7 (H) 1.7 - 7.7 K/uL   Lymphocytes Relative 20 %   Lymphs Abs 2.4 0.7 - 4.0 K/uL   Monocytes Relative 7 %   Monocytes Absolute 0.9 0.1 - 1.0 K/uL   Eosinophils Relative 1 %  Eosinophils Absolute 0.1 0.0 - 0.7 K/uL   Basophils Relative 0 %   Basophils Absolute 0.0 0.0 - 0.1 K/uL  Basic metabolic panel     Status: Abnormal   Collection Time: 06/20/16 11:37 PM  Result Value Ref Range   Sodium 139 135 - 145 mmol/L   Potassium 3.7 3.5 - 5.1 mmol/L   Chloride 105 101 - 111 mmol/L   CO2 25 22 - 32 mmol/L   Glucose, Bld 115 (H) 65 - 99 mg/dL   BUN 12 6 - 20 mg/dL   Creatinine, Ser 0.77 0.61 - 1.24 mg/dL   Calcium 9.2 8.9 - 10.3 mg/dL   GFR calc non Af Amer >60 >60 mL/min   GFR calc Af Amer >60 >60 mL/min    Comment: (NOTE) The eGFR has been calculated using the CKD EPI equation. This calculation has not been validated in all clinical situations. eGFR's persistently <60 mL/min signify possible Chronic  Kidney Disease.    Anion gap 9 5 - 15  TSH     Status: None   Collection Time: 06/20/16 11:57 PM  Result Value Ref Range   TSH 1.758 0.350 - 4.500 uIU/mL    Comment: Performed by a 3rd Generation assay with a functional sensitivity of <=0.01 uIU/mL.  CBC     Status: None   Collection Time: 06/21/16  5:28 AM  Result Value Ref Range   WBC 10.2 4.0 - 10.5 K/uL   RBC 4.97 4.22 - 5.81 MIL/uL   Hemoglobin 15.1 13.0 - 17.0 g/dL   HCT 41.9 39.0 - 52.0 %   MCV 84.3 78.0 - 100.0 fL   MCH 30.4 26.0 - 34.0 pg   MCHC 36.0 30.0 - 36.0 g/dL   RDW 14.2 11.5 - 15.5 %   Platelets 250 150 - 400 K/uL  Comprehensive metabolic panel     Status: None   Collection Time: 06/21/16  5:28 AM  Result Value Ref Range   Sodium 141 135 - 145 mmol/L   Potassium 4.5 3.5 - 5.1 mmol/L    Comment: DELTA CHECK NOTED   Chloride 107 101 - 111 mmol/L   CO2 28 22 - 32 mmol/L   Glucose, Bld 98 65 - 99 mg/dL   BUN 12 6 - 20 mg/dL   Creatinine, Ser 0.80 0.61 - 1.24 mg/dL   Calcium 9.5 8.9 - 10.3 mg/dL   Total Protein 6.9 6.5 - 8.1 g/dL   Albumin 4.0 3.5 - 5.0 g/dL   AST 18 15 - 41 U/L   ALT 21 17 - 63 U/L   Alkaline Phosphatase 69 38 - 126 U/L   Total Bilirubin 1.2 0.3 - 1.2 mg/dL   GFR calc non Af Amer >60 >60 mL/min   GFR calc Af Amer >60 >60 mL/min    Comment: (NOTE) The eGFR has been calculated using the CKD EPI equation. This calculation has not been validated in all clinical situations. eGFR's persistently <60 mL/min signify possible Chronic Kidney Disease.    Anion gap 6 5 - 15  Urinalysis, Complete w Microscopic     Status: Abnormal   Collection Time: 06/21/16  9:30 AM  Result Value Ref Range   Color, Urine STRAW (A) YELLOW   APPearance CLEAR CLEAR   Specific Gravity, Urine 1.006 1.005 - 1.030   pH 7.0 5.0 - 8.0   Glucose, UA NEGATIVE NEGATIVE mg/dL   Hgb urine dipstick NEGATIVE NEGATIVE   Bilirubin Urine NEGATIVE NEGATIVE   Ketones, ur NEGATIVE NEGATIVE mg/dL  Protein, ur NEGATIVE NEGATIVE  mg/dL   Nitrite NEGATIVE NEGATIVE   Leukocytes, UA NEGATIVE NEGATIVE   RBC / HPF NONE SEEN 0 - 5 RBC/hpf   WBC, UA NONE SEEN 0 - 5 WBC/hpf   Bacteria, UA RARE (A) NONE SEEN   Squamous Epithelial / LPF 0-5 (A) NONE SEEN   Mucous PRESENT   Culture, Urine     Status: None   Collection Time: 06/21/16  9:30 AM  Result Value Ref Range   Specimen Description URINE, CLEAN CATCH    Special Requests NONE    Culture      NO GROWTH Performed at [facility], [address], [place], [place] [address]    Report Status 06/22/2016 FINAL   Vitamin B12     Status: None   Collection Time: 06/21/16  9:40 AM  Result Value Ref Range   Vitamin B-12 228 180 - 914 pg/mL    Comment: (NOTE) This assay is not validated for testing neonatal or myeloproliferative syndrome specimens for Vitamin B12 levels. Performed at [facility], [address] [place], [place] [address]   HIV antibody     Status: None   Collection Time: 06/21/16 11:10 AM  Result Value Ref Range   HIV Screen 4th Generation wRfx Non Reactive Non Reactive    Comment: (NOTE) Performed At: [facility] [address] [place], [place] [id number] Mila Homer MD [id number]   CK total and CKMB (cardiac)not at [facility]     Status: Abnormal   Collection Time: 06/22/16  5:40 AM  Result Value Ref Range   Total CK 126 49 - 397 U/L   CK, MB 4.3 0.5 - 5.0 ng/mL   Relative Index 3.4 (H) 0.0 - 2.5    Comment: Performed at [facility], [address], [place], [place] [address]   Ct Head Wo Contrast  Result Date: 06/20/2016 CLINICAL DATA:  Fall striking face while using walker. No loss of consciousness. EXAM: CT HEAD WITHOUT CONTRAST CT MAXILLOFACIAL WITHOUT CONTRAST CT CERVICAL SPINE WITHOUT CONTRAST TECHNIQUE: Multidetector CT imaging of the head, cervical spine, and maxillofacial structures were performed using the standard protocol without intravenous contrast. Multiplanar CT image reconstructions of the cervical  spine and maxillofacial structures were also generated. COMPARISON:  None. FINDINGS: CT HEAD FINDINGS Brain: No evidence of acute infarction, hemorrhage, hydrocephalus, extra-axial collection or mass lesion/mass effect. Vascular: No hyperdense vessel or unexpected calcification. Skull: No skull fracture.  Right frontal scalp hematoma. Other: None. CT MAXILLOFACIAL FINDINGS Osseous: No acute facial bone fracture. Possible remote nasal bone fracture. Zygomatic arches, mandibles, and pterygoid plates are intact. There is anterior subluxation of the a right temporomandibular joint. Orbits: No orbital fracture.  Both orbits and globes are intact. Sinuses: No fluid levels. Scattered mucosal thickening in the maxillary sinuses and ethmoid air cells. Soft tissues: Right frontal scalp hematoma. CT CERVICAL SPINE FINDINGS Alignment: Straightening of normal lordosis. No listhesis, jumped or perched facets. Lateral masses of C1 are well aligned on C2. Skull base and vertebrae: No acute fracture. Dens and skullbase are intact. Anterior fusion C5 through C7 with intact hardware. Non fusion posterior elements of C1, a normal variant. Soft tissues and spinal canal: No prevertebral fluid or swelling. No visible canal hematoma. Disc levels: Disc spacers at C5-C6 and C6-C7. Minimal endplate spurring at C4-C5. Upper chest: No acute abnormality. Other: None. IMPRESSION: 1. Right frontal scalp hematoma. No skull fracture or acute intracranial abnormality. 2. No facial bone fracture.  3. Postsurgical change in the cervical spine with intact hardware. No fracture or acute subluxation. Electronically Signed   By: Jeb Levering M.D.   On: 06/20/2016 23:10   Ct Cervical Spine Wo Contrast  Result Date: 06/20/2016 CLINICAL DATA:  Fall striking face while using walker. No loss of consciousness. EXAM: CT HEAD WITHOUT CONTRAST CT MAXILLOFACIAL WITHOUT CONTRAST CT CERVICAL SPINE WITHOUT CONTRAST TECHNIQUE: Multidetector CT imaging of the head,  cervical spine, and maxillofacial structures were performed using the standard protocol without intravenous contrast. Multiplanar CT image reconstructions of the cervical spine and maxillofacial structures were also generated. COMPARISON:  None. FINDINGS: CT HEAD FINDINGS Brain: No evidence of acute infarction, hemorrhage, hydrocephalus, extra-axial collection or mass lesion/mass effect. Vascular: No hyperdense vessel or unexpected calcification. Skull: No skull fracture.  Right frontal scalp hematoma. Other: None. CT MAXILLOFACIAL FINDINGS Osseous: No acute facial bone fracture. Possible remote nasal bone fracture. Zygomatic arches, mandibles, and pterygoid plates are intact. There is anterior subluxation of the a right temporomandibular joint. Orbits: No orbital fracture.  Both orbits and globes are intact. Sinuses: No fluid levels. Scattered mucosal thickening in the maxillary sinuses and ethmoid air cells. Soft tissues: Right frontal scalp hematoma. CT CERVICAL SPINE FINDINGS Alignment: Straightening of normal lordosis. No listhesis, jumped or perched facets. Lateral masses of C1 are well aligned on C2. Skull base and vertebrae: No acute fracture. Dens and skullbase are intact. Anterior fusion C5 through C7 with intact hardware. Non fusion posterior elements of C1, a normal variant. Soft tissues and spinal canal: No prevertebral fluid or swelling. No visible canal hematoma. Disc levels: Disc spacers at C5-C6 and C6-C7. Minimal endplate spurring at Q0-G8. Upper chest: No acute abnormality. Other: None. IMPRESSION: 1. Right frontal scalp hematoma. No skull fracture or acute intracranial abnormality. 2. No facial bone fracture. 3. Postsurgical change in the cervical spine with intact hardware. No fracture or acute subluxation. Electronically Signed   By: Jeb Levering M.D.   On: 06/20/2016 23:10   Ct Maxillofacial Wo Contrast  Result Date: 06/20/2016 CLINICAL DATA:  Fall striking face while using walker. No  loss of consciousness. EXAM: CT HEAD WITHOUT CONTRAST CT MAXILLOFACIAL WITHOUT CONTRAST CT CERVICAL SPINE WITHOUT CONTRAST TECHNIQUE: Multidetector CT imaging of the head, cervical spine, and maxillofacial structures were performed using the standard protocol without intravenous contrast. Multiplanar CT image reconstructions of the cervical spine and maxillofacial structures were also generated. COMPARISON:  None. FINDINGS: CT HEAD FINDINGS Brain: No evidence of acute infarction, hemorrhage, hydrocephalus, extra-axial collection or mass lesion/mass effect. Vascular: No hyperdense vessel or unexpected calcification. Skull: No skull fracture.  Right frontal scalp hematoma. Other: None. CT MAXILLOFACIAL FINDINGS Osseous: No acute facial bone fracture. Possible remote nasal bone fracture. Zygomatic arches, mandibles, and pterygoid plates are intact. There is anterior subluxation of the a right temporomandibular joint. Orbits: No orbital fracture.  Both orbits and globes are intact. Sinuses: No fluid levels. Scattered mucosal thickening in the maxillary sinuses and ethmoid air cells. Soft tissues: Right frontal scalp hematoma. CT CERVICAL SPINE FINDINGS Alignment: Straightening of normal lordosis. No listhesis, jumped or perched facets. Lateral masses of C1 are well aligned on C2. Skull base and vertebrae: No acute fracture. Dens and skullbase are intact. Anterior fusion C5 through C7 with intact hardware. Non fusion posterior elements of C1, a normal variant. Soft tissues and spinal canal: No prevertebral fluid or swelling. No visible canal hematoma. Disc levels: Disc spacers at C5-C6 and C6-C7. Minimal endplate spurring at Q7-Y1. Upper chest: No acute  abnormality. Other: None. IMPRESSION: 1. Right frontal scalp hematoma. No skull fracture or acute intracranial abnormality. 2. No facial bone fracture. 3. Postsurgical change in the cervical spine with intact hardware. No fracture or acute subluxation. Electronically  Signed   By: Jeb Levering M.D.   On: 06/20/2016 23:10       Medical Problem List and Plan: 1.  Deficits in mobility as well as inability to carry out ADL tasks secondary to cervical myelopathy. 2.  DVT Prophylaxis/Anticoagulation: Pharmaceutical: Lovenox 3. Pain Management: tylenol prn.  4. Mood: LCSW to follow for evaluation and support. Team to provide ego support.  5. Neuropsych: This patient is capable of making decisions on his own behalf. 6. Skin/Wound Care: Monitor daily. Routine pressure relief measures 7. Fluids/Electrolytes/Nutrition: Monitor I/O.   BMP WNL 6/9 8. HTN: Monitor BP bid. Continue Norvasc.  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   Cont bracing 10. Vitamin B 12 deficiency: on supplement.  11. H/O of  Anxiety: Monitor for now--does not want to start any meds. Trazodone at nights prn to help manage anxiety at nights.  12. Hypoalbuminemia  Supplement initiated 6/9   Post Admission Physician Evaluation: 1. Preadmission assessment reviewed and changes made below. 2. Functional deficits secondary  to cervical myelopathy. 3. Patient is admitted to receive collaborative, interdisciplinary care between the physiatrist, rehab nursing staff, and therapy team. 4. Patient's level of medical complexity and substantial therapy needs in context of that medical necessity cannot be provided at a lesser intensity of care such as a SNF. 5. Patient has experienced substantial functional loss from his/her baseline which was documented above under the "Functional History" and "Functional Status" headings.  Judging by the patient's diagnosis, physical exam, and functional history, the patient has potential for functional progress which will result in measurable gains while on inpatient rehab.  These gains will be of substantial and practical use upon discharge  in facilitating mobility and self-care at the household level. 27. Physiatrist will  provide 24 hour management of medical needs as well as oversight of the therapy plan/treatment and provide guidance as appropriate regarding the interaction of the two. 7. 24 hour rehab nursing will assist with safety, disease management and patient education  and help integrate therapy concepts, techniques,education, etc. 8. PT will assess and treat for/with: Lower extremity strength, range of motion, stamina, balance, functional mobility, safety, adaptive techniques and equipment,  coping skills, pain control, education.   Goals are: Min A. 9. OT will assess and treat for/with: ADL's, functional mobility, safety, upper extremity strength, adaptive techniques and equipment, ego support, and community reintegration.   Goals are: Min A. Therapy may proceed with showering this patient. 10. SLP will assess and treat for/with: speech, swallowing, higher level cognition.  Goals are: Mod I/Ind. 11. Case Management and Social Worker will assess and treat for psychological issues and discharge planning. 12. Team conference will be held weekly to assess progress toward goals and to determine barriers to discharge. 13. Patient will receive at least 3 hours of therapy per day at least 5 days per week. 14. ELOS: 15-19 days.       15. Prognosis:  good     Delice Lesch, MD, Mellody Drown 06/23/16 Bary Leriche, PA-C 06/22/2016

## 2016-06-25 NOTE — Progress Notes (Signed)
Occupational Therapy Session Note  Patient Details  Name: Steve Koch MRN: [id number] Date of Birth: April 07, 1960  Today's Date: 06/25/2016 OT Individual Time:  - 1030-1120   (50 min)      Short Term Goals: Week 1:  OT Short Term Goal 1 (Week 1): Pt will perform toilet transfer with max A in order to decrease level of assistance needed with transfer. OT Short Term Goal 2 (Week 1): Pt will perform LB clothing management with mod A for standing balance.  OT Short Term Goal 3 (Week 1): Pt will perform LB dressing with mod A. :     Skilled Therapeutic Interventions/Progress Updates:    Addressed bed mobility, sit to stand, and using UE in functional self care tasks.    Pt's wife Alvis Lemmings(Dawn) present and assisted as needed during tasks.  Pt performed bed mobility with max assist.  Sit to stand with mod assist and stand pivot to wc.   Completed bathing and dressing at wc level at sink.  Pt wanted to shower but clinician needed +2 for safety.  Pt stood at sink for peri care and donning pants with mod assist and max cues for postural control while wife assisted with self care needs.   Left pt in wc with all needs in reach.    Therapy Documentation Precautions:  Precautions Precautions: Fall, Cervical Precaution Comments: no brace required Required Braces or Orthoses: Other Brace/Splint Other Brace/Splint: pt has RUE resting hand splint at night & dynamic hand splint for during the day Restrictions Weight Bearing Restrictions: No General:   Vital Signs: Therapy Vitals Temp: 98.1 F (36.7 C) Temp Source: Oral Pulse Rate: 66 Resp: 18 BP: (!) 148/85 (notified nurse) Patient Position (if appropriate): Sitting Oxygen Therapy SpO2: 98 % O2 Device: Not Delivered Pain:  none   ADL:   Vision   Perception    Praxis   Exercises:   Other Treatments:    See Function Navigator for Current Functional Status.   Therapy/Group: Individual Therapy  Humberto Sealsdwards, Jaydence Vanyo J 06/25/2016, 5:35  PM

## 2016-06-25 NOTE — Progress Notes (Signed)
[place] PHYSICAL MEDICINE & REHABILITATION     PROGRESS NOTE  Subjective/Complaints:  Pt seen laying in bed this AM.  He slept well overnight.  He had a good day of therapies yesterday and states he is excited tor resume.  Wife has questions regarding bone stimulator.  ROS: Denies CP,SOB, N/V/D.  Objective: Vital Signs: Blood pressure 112/88, pulse 60, temperature 98.5 F (36.9 C), temperature source Oral, resp. rate 16, SpO2 97 %. No results found.  Recent Labs  06/24/16 0448  WBC 10.5  HGB 14.6  HCT 41.4  PLT 269    Recent Labs  06/24/16 0448  NA 140  K 4.6  CL 105  GLUCOSE 99  BUN 12  CREATININE 1.00  CALCIUM 9.1   CBG (last 3)  No results for input(s): GLUCAP in the last 72 hours.  Wt Readings from Last 3 Encounters:  06/21/16 116.9 kg (257 lb 11.5 oz)  03/09/16 116.1 kg (256 lb)  03/07/16 116.3 kg (256 lb 8 oz)    Physical Exam:  BP 112/88 (BP Location: Left Arm)   Pulse 60   Temp 98.5 F (36.9 C) (Oral)   Resp 16   SpO2 97%  Constitutional: He appears well-developed and well-nourished.  HENT: Normocephalic and atraumatic.  Eyes: EOMI. No discharge.  Cardiovascular: Normal rate and regular rhythm.  No JVD. Respiratory: Effort normal and breath sounds normal.  GI: Soft. Bowel sounds are normal.  Musculoskeletal: He exhibits edema. He exhibits no tenderness. Tight heel cords on right.   Neurological: He is alert and oriented.  Dysarthria Motor: RUE: 0/5 LUE: Shoulder abduction, elbow flexion/extension 4/5, wrist, hand grip 2+/5  B/l LE 4-/5 grossly Skin: Skin is warm and dry.  Psychiatric: Normal mood and behavior. His speech is slurred. Cognition and memory are not impaired  Assessment/Plan: 1. Functional deficits secondary to cervical myelopathy which require 3+ hours per day of interdisciplinary therapy in a comprehensive inpatient rehab setting. Physiatrist is providing close team supervision and 24 hour management of active medical  problems listed below. Physiatrist and rehab team continue to assess barriers to discharge/monitor patient progress toward functional and medical goals.  Function:  Bathing Bathing position Bathing activity did not occur: Refused    Bathing parts      Bathing assist        Upper Body Dressing/Undressing Upper body dressing Upper body dressing/undressing activity did not occur: Refused                  Upper body assist        Lower Body Dressing/Undressing Lower body dressing   What is the patient wearing?: Socks, Shoes               Socks - Performed by helper: Don/doff right sock, Don/doff left sock   Shoes - Performed by helper: Don/doff right shoe, Don/doff left shoe, Fasten right, Fasten left          Lower body assist        Toileting Toileting Toileting activity did not occur: No continent bowel/bladder event        Toileting assist     Transfers Chair/bed transfer   Chair/bed transfer method: Stand pivot Chair/bed transfer assist level: Maximal assist (Pt 25 - 49%/lift and lower) Chair/bed transfer assistive device: Mechanical lift Mechanical lift: Development worker, community Comprehension Comprehension assist level:  Follows complex conversation/direction with no assist  Expression Expression assist level: Expresses complex 90% of the time/cues < 10% of the time  Social Interaction Social Interaction assist level: Interacts appropriately with others - No medications needed.  Problem Solving Problem solving assist level: Solves complex problems: Recognizes & self-corrects  Memory Memory assist level: Complete Independence: No helper    Medical Problem List and Plan: 1.  Deficits in mobility as well as inability to carry out ADL tasks secondary to cervical myelopathy.  Cont CIR  May use bone stimulator 2.  DVT Prophylaxis/Anticoagulation: Pharmaceutical: Lovenox 3. Pain Management:  tylenol prn.  4. Mood: LCSW to follow for evaluation and support. Team to provide ego support.  5. Neuropsych: This patient is capable of making decisions on his own behalf. 6. Skin/Wound Care: Monitor daily. Routine pressure relief measures 7. Fluids/Electrolytes/Nutrition: Monitor I/O.              BMP WNL 6/9 8. HTN: Monitor BP bid. Continue Norvasc.  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              Cont bracing 10. Vitamin B 12 deficiency: on supplement.  11. H/O of  Anxiety: Monitor for now--does not want to start any meds. Trazodone at nights prn to help manage anxiety at nights.   Stable at present 12. Hypoalbuminemia             Supplement initiated 6/9  LOS (Days) 2 A FACE TO FACE EVALUATION WAS PERFORMED  Ankit Karis Jubanil Patel 06/25/2016 9:50 AM

## 2016-06-25 NOTE — Plan of Care (Signed)
Problem: SCI BLADDER ELIMINATION Goal: RH STG MANAGE BLADDER WITH EQUIPMENT WITH ASSISTANCE STG Manage Bladder With Equipment With min Assistance   Outcome: Progressing Assistance with urinal  Problem: RH SKIN INTEGRITY Goal: RH STG SKIN FREE OF INFECTION/BREAKDOWN No skin breakdown this admission.  Outcome: Progressing No skin issues noted  Problem: RH PAIN MANAGEMENT Goal: RH STG PAIN MANAGED AT OR BELOW PT'S PAIN GOAL Pain less than or equal to 2  Outcome: Progressing No complaints of pain thisshift

## 2016-06-26 ENCOUNTER — Inpatient Hospital Stay (HOSPITAL_COMMUNITY): Payer: Self-pay

## 2016-06-26 ENCOUNTER — Inpatient Hospital Stay (HOSPITAL_COMMUNITY): Payer: Self-pay | Admitting: Occupational Therapy

## 2016-06-26 ENCOUNTER — Inpatient Hospital Stay (HOSPITAL_COMMUNITY): Payer: Self-pay | Admitting: Physical Therapy

## 2016-06-26 ENCOUNTER — Encounter (HOSPITAL_COMMUNITY): Payer: Self-pay

## 2016-06-26 ENCOUNTER — Inpatient Hospital Stay (HOSPITAL_COMMUNITY): Payer: BLUE CROSS/BLUE SHIELD | Admitting: *Deleted

## 2016-06-26 NOTE — IPOC Note (Signed)
Overall Plan of Care [facility](IPOC) Patient Details Name: Steve Koch MRN: [id number] DOB: [birth year]  Admitting Diagnosis: Cervical Myelap  Hospital Problems: Active Problems:   Cervical myelopathy (HCC)   Benign essential HTN   Spastic quadriparesis (HCC)   Vitamin B 12 deficiency   Generalized anxiety disorder   Hypoalbuminemia due to protein-calorie malnutrition (HCC)     Functional Problem List: Nursing Endurance, Safety, Motor, Edema, Sensory, Medication Management  PT Balance, Sensory, Endurance, Motor, Safety  OT Balance, Endurance, Motor, Pain, Safety  SLP Nutrition, Motor  TR         Basic ADL's: OT Eating, Grooming, Bathing, Dressing, Toileting     Advanced  ADL's: OT       Transfers: PT Bed Mobility, Bed to Chair, Car, Occupational psychologisturniture  OT Toilet, Research scientist (life sciences)Tub/Shower     Locomotion: PT Ambulation, Psychologist, prison and probation servicesWheelchair Mobility, Stairs     Additional Impairments: OT Fuctional Use of Upper Extremity  SLP Swallowing, Communication expression    TR      Anticipated Outcomes Item Anticipated Outcome  Self Feeding set up  Swallowing  mod I   Basic self-care  min A  Toileting  min A   Bathroom Transfers min A  Bowel/Bladder     Transfers  min assist  Locomotion  min assist  Communication  mod I   Cognition     Pain     Safety/Judgment  maintain safety with cues and reminders   Therapy Plan: PT Intensity: Minimum of 1-2 x/day ,45 to 90 minutes PT Frequency: 5 out of 7 days PT Duration Estimated Length of Stay: 3 weeks OT Intensity: Minimum of 1-2 x/day, 45 to 90 minutes OT Frequency: 5 out of 7 days OT Duration/Estimated Length of Stay: 17-21 days SLP Intensity: Minumum of 1-2 x/day, 30 to 90 minutes SLP Frequency: 3 to 5 out of 7 days SLP Duration/Estimated Length of Stay: 14-21 days       Team Interventions: Nursing Interventions Disease Management/Prevention  PT interventions Ambulation/gait training, Community reintegration, DME/adaptive equipment  instruction, Neuromuscular re-education, Psychosocial support, Stair training, UE/LE Strength taining/ROM, Wheelchair propulsion/positioning, Warden/rangerBalance/vestibular training, Discharge planning, Pain management, Functional electrical stimulation, Skin care/wound management, Therapeutic Activities, UE/LE Coordination activities, Disease management/prevention, Functional mobility training, Patient/family education, Splinting/orthotics, Therapeutic Exercise, Visual/perceptual remediation/compensation  OT Interventions Warden/rangerBalance/vestibular training, Community reintegration, Discharge planning, Functional mobility training, Psychosocial support, Therapeutic Activities, UE/LE Coordination activities, Patient/family education, DME/adaptive equipment instruction, Pain management, UE/LE Strength taining/ROM, Therapeutic Exercise, Wheelchair propulsion/positioning, Self Care/advanced ADL retraining, Neuromuscular re-education  SLP Interventions Cueing hierarchy, Dysphagia/aspiration precaution training, Environmental controls, Functional tasks, Internal/external aids, Speech/Language facilitation, Patient/family education  TR Interventions    SW/CM Interventions Discharge Planning, Psychosocial Support, Patient/Family Education    Team Discharge Planning: Destination: PT-Home ,OT- Home , SLP-Home Projected Follow-up: PT-24 hour supervision/assistance (HHPT vs OPPT), OT-  Home health OT, SLP-Outpatient SLP Projected Equipment Needs: PT-To be determined, OT- To be determined, SLP-None recommended by SLP Equipment Details: PT- , OT-  Patient/family involved in discharge planning: PT- Patient,  OT-Patient, SLP-Patient  MD ELOS: 17-21 days Medical Rehab Prognosis:  Excellent Assessment: The patient has been admitted for CIR therapies with the diagnosis of cervical myelopathy with central cord syndrome. The team will be addressing functional mobility, strength, stamina, balance, safety, adaptive techniques and equipment,  self-care, bowel and bladder mgt, patient and caregiver education, NMR, orthotics, pain control, spasticity mgt, feeding,swallowing, and communication. Goals have been set at min assist for self-care/ADL's and mobility.    Ranelle OysterZachary T. Swartz, MD, [place] DomFAAPMR  See Team Conference Notes for weekly updates to the plan of care

## 2016-06-26 NOTE — Progress Notes (Signed)
Physical Therapy Session Note  Patient Details  Name: Steve Koch MRN: [id number] Date of Birth: Sep 05, 1960  Today's Date: 06/26/2016 PT Individual Time: 1500-1530 PT Individual Time Calculation (min): 30 min   Short Term Goals: Week 1:  PT Short Term Goal 1 (Week 1): Pt will complete sit<>stand transfer with max assist +1. PT Short Term Goal 2 (Week 1): Pt will complete bed<>chair with max assist +1.  PT Short Term Goal 3 (Week 1): Pt will initiate gait training.   Skilled Therapeutic Interventions/Progress Updates: Pt received seated in w/c, denies pain and agreeable to treatment. W/c propulsion x50' with BLEs forward/backward for hamstring/quad strengthening with modA for LE placement and cues for technique. Squat pivot transfer w/c <>car with maxA +2 for technique d/t posterior bias and poor anterior weight shift and decreased hip clearance; maxA for LE management in/out of car. Sit <>stand from w/c with modA +2 three muskateers. In standing note posterior bias, absent ankle righting strategy/DF activation. Second trial on small red wedge for gastroc/soleus stretch and tactile cues for anterior weight shift with tactile cueing at anterior pelvis. Returned to room totalA in w/c for energy conservation. Remained seated in w/c at end of session, all needs in reach.      Therapy Documentation Precautions:  Precautions Precautions: Fall, Cervical Precaution Comments: no brace required Required Braces or Orthoses: Other Brace/Splint Other Brace/Splint: pt has RUE resting hand splint at night & dynamic hand splint for during the day Restrictions Weight Bearing Restrictions: No   See Function Navigator for Current Functional Status.   Therapy/Group: Individual Therapy  Vista Lawmanlizabeth J Tygielski 06/26/2016, 3:34 PM

## 2016-06-26 NOTE — Progress Notes (Signed)
[place] PHYSICAL MEDICINE & REHABILITATION     PROGRESS NOTE  Subjective/Complaints:  Pt in bed. Comfortable. Moving bowels. Pain seems controlled. States he's emptying bowels and bladder  ROS: pt denies nausea, vomiting, diarrhea, cough, shortness of breath or chest pain  Objective: Vital Signs: Blood pressure 138/68, pulse 63, temperature 98.1 F (36.7 C), temperature source Oral, resp. rate 16, SpO2 98 %. No results found.  Recent Labs  06/24/16 0448  WBC 10.5  HGB 14.6  HCT 41.4  PLT 269    Recent Labs  06/24/16 0448  NA 140  K 4.6  CL 105  GLUCOSE 99  BUN 12  CREATININE 1.00  CALCIUM 9.1   CBG (last 3)  No results for input(s): GLUCAP in the last 72 hours.  Wt Readings from Last 3 Encounters:  06/21/16 116.9 kg (257 lb 11.5 oz)  03/09/16 116.1 kg (256 lb)  03/07/16 116.3 kg (256 lb 8 oz)    Physical Exam:  BP 138/68 (BP Location: Right Arm)   Pulse 63   Temp 98.1 F (36.7 C) (Oral)   Resp 16   SpO2 98%  Constitutional: He appears well-developed and well-nourished.  HENT: Normocephalic and atraumatic. Bruises on face/around eyes Eyes: EOMI. No discharge.  Cardiovascular: RRR without JVD Respiratory: CTA Bilaterally without wheezes or rales. Normal effort GI: Soft. Bowel sounds are normal.  Musculoskeletal: He exhibits edema. He exhibits no tenderness. Tight heel cords on right.   Neurological: He is alert and oriented.  Dysarthria Motor: RUE: 1-2/5 prox to distal. Wearing glove/resting splint LUE: Shoulder abduction, elbow flexion/extension 4/5, wrist, hand grip 2+/5, contractures of digits 3 and 4.  B/l LE 4-/5 prox to distal.  Skin: Skin is warm and dry.  Psychiatric: Normal mood and behavior.  Cognition and memory are not impaired  Assessment/Plan: 1. Functional deficits secondary to cervical myelopathy which require 3+ hours per day of interdisciplinary therapy in a comprehensive inpatient rehab setting. Physiatrist is providing close team  supervision and 24 hour management of active medical problems listed below. Physiatrist and rehab team continue to assess barriers to discharge/monitor patient progress toward functional and medical goals.  Function:  Bathing Bathing position Bathing activity did not occur: Refused Position: Wheelchair/chair at sink  Bathing parts Body parts bathed by patient: Abdomen Body parts bathed by helper: Right arm, Left arm, Chest, Front perineal area, Buttocks, Right upper leg, Left upper leg, Right lower leg, Left lower leg, Back  Bathing assist Assist Level: 2 helpers      Upper Body Dressing/Undressing Upper body dressing Upper body dressing/undressing activity did not occur: Refused What is the patient wearing?: Pull over shirt/dress       Pull over shirt/dress - Perfomed by helper: Thread/unthread right sleeve, Thread/unthread left sleeve, Put head through opening, Pull shirt over trunk        Upper body assist Assist Level: Touching or steadying assistance(Pt > 75%)      Lower Body Dressing/Undressing Lower body dressing   What is the patient wearing?: Shoes, Pants       Pants- Performed by helper: Thread/unthread right pants leg, Pull pants up/down, Thread/unthread left pants leg       Socks - Performed by helper: Don/doff right sock, Don/doff left sock   Shoes - Performed by helper: Don/doff right shoe, Don/doff left shoe          Lower body assist Assist for lower body dressing: 2 Helpers      Toileting Toileting Toileting activity did not  occur: Safety/medical concerns        Toileting assist     Transfers Chair/bed transfer   Chair/bed transfer method: Stand pivot Chair/bed transfer assist level: Maximal assist (Pt 25 - 49%/lift and lower) Chair/bed transfer assistive device: Mechanical lift Mechanical lift: Development worker, community Comprehension Comprehension assist level: Follows complex  conversation/direction with no assist  Expression Expression assist level: Expresses complex 90% of the time/cues < 10% of the time  Social Interaction Social Interaction assist level: Interacts appropriately with others - No medications needed.  Problem Solving Problem solving assist level: Solves complex problems: Recognizes & self-corrects  Memory Memory assist level: Complete Independence: No helper    Medical Problem List and Plan: 1.  Deficits in mobility as well as inability to carry out ADL tasks secondary to cervical myelopathy, hx of ACDF in February 2018.  Cont CIR therapies  May use bone stimulator 2.  DVT Prophylaxis/Anticoagulation: Pharmaceutical: Lovenox 3. Pain Management: tylenol prn.  4. Mood: LCSW to follow for evaluation and support. Team to provide ego support.  5. Neuropsych: This patient is capable of making decisions on his own behalf. 6. Skin/Wound Care: Monitor daily. Routine pressure relief measures 7. Fluids/Electrolytes/Nutrition: Monitor I/O.              BMP WNL 6/9 8. HTN: Monitor BP bid. Continue Norvasc.  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              Cont bracing 10. Vitamin B 12 deficiency: on supplement.  11. H/O of  Anxiety: Monitor for now--does not want to start any meds. Trazodone at nights prn to help manage anxiety at nights.   Stable at present 12. Hypoalbuminemia             Supplement initiated 6/9  LOS (Days) 3 A FACE TO FACE EVALUATION WAS PERFORMED  Arantxa Piercey T 06/26/2016 7:53 AM

## 2016-06-26 NOTE — Progress Notes (Signed)
Occupational Therapy Session Note  Patient Details  Name: Steve Koch MRN: [id number] Date of Birth: May 05, 1960  Today's Date: 06/26/2016 OT Individual Time: 0900-1000 OT Individual Time Calculation (min): 60 min    Short Term Goals: Week 1:  OT Short Term Goal 1 (Week 1): Pt will perform toilet transfer with max A in order to decrease level of assistance needed with transfer. OT Short Term Goal 2 (Week 1): Pt will perform LB clothing management with mod A for standing balance.  OT Short Term Goal 3 (Week 1): Pt will perform LB dressing with mod A.  Skilled Therapeutic Interventions/Progress Updates:    Pt engaged in BADL retraining including bathing/dressing with sit<>stand (Stedy) from EOB.  Pt requires max/tot A for bathing dressing tasks.  Pt able to maintain sitting balance EOB with LOB X1 requiring mod A to correct.  Pt required max A for sit<>stand from EOB X 4 to complete LB bathing/dressing tasks.  Pt able to maintain standing balance in Stedy with min A and max verbal cues for posture.  Pt demonstrates significant posterior lean when initiating sit<>stand and requires max A to correct.  Pt remained in w/c with all needs within reach.   Therapy Documentation Precautions:  Precautions Precautions: Fall, Cervical Precaution Comments: no brace required Required Braces or Orthoses: Other Brace/Splint Other Brace/Splint: pt has RUE resting hand splint at night & dynamic hand splint for during the day Restrictions Weight Bearing Restrictions: No   Pain: Pain Assessment Pain Assessment: No/denies pain  See Function Navigator for Current Functional Status.   Therapy/Group: Individual Therapy  [name], [name] 06/26/2016, 11:09 AM

## 2016-06-26 NOTE — Progress Notes (Signed)
Occupational Therapy Session Note  Patient Details  Name: Steve Koch MRN: [id number] Date of Birth: 03-Jan-1961  Today's Date: 06/26/2016 OT Individual Time: 1045-1200 OT Individual Time Calculation (min): 75 min    Short Term Goals: Week 1:  OT Short Term Goal 1 (Week 1): Pt will perform toilet transfer with max A in order to decrease level of assistance needed with transfer. OT Short Term Goal 2 (Week 1): Pt will perform LB clothing management with mod A for standing balance.  OT Short Term Goal 3 (Week 1): Pt will perform LB dressing with mod A.  Skilled Therapeutic Interventions/Progress Updates:    Pt seen for OT session focusing on functional transfers and B UE neuro re-ed. Pt sitting up in w/c upon arrival, wife present and requesting use of hand held urinal. He required assist for anterior scoot to edge of w/c, and +2 to stand into STEDY, total A for clothing management and placement of urinal.  In therapy gym, pt issued universal cuff and pt able to scoop applesauce and bring to mouth using L UE. Also issued plate guard and educated regarding use and set-up with directing caregiver.  Completed towel pushes with elbow supported, internal and external rotation, pt able to achieve full ROM with L UE, requiring min A  For full external range with R UE and able internally rotate independently. x2 sets of 10 R/L. Required increased time and rest breaks throughout. Educated regarding importance of quality vs quantity with exercises and eliminating compensatory movements/ strategies.  He then completed gross grasping exercises using L UE to grasp beanbag and move to opposite side of table focusing on grasping and elbow flexion/extension. Completed anterior reaching as well, pt required to reach below to obtain bean bag and anterior lean to place beanbag. Completed with min A and close supervision.  Pt returned to room total A in w/c at end of session, all needs in reach.   Therapy  Documentation Precautions:  Precautions Precautions: Fall, Cervical Precaution Comments: no brace required Required Braces or Orthoses: Other Brace/Splint Other Brace/Splint: pt has RUE resting hand splint at night & dynamic hand splint for during the day Restrictions Weight Bearing Restrictions: No Pain: Pain Assessment Pain Assessment: No/denies pain  See Function Navigator for Current Functional Status.   Therapy/Group: Individual Therapy  Lewis, Kamrynn Melott C 06/26/2016, 7:17 AM

## 2016-06-26 NOTE — Progress Notes (Signed)
Physical Therapy Session Note  Patient Details  Name: Steve Koch MRN: [id number] Date of Birth: [birth year]  Today's Date: 06/26/2016 PT Individual Time: 1335-1435 PT Individual Time Calculation (min): 60 min   Short Term Goals: Week 1:  PT Short Term Goal 1 (Week 1): Pt will complete sit<>stand transfer with max assist +1. PT Short Term Goal 2 (Week 1): Pt will complete bed<>chair with max assist +1.  PT Short Term Goal 3 (Week 1): Pt will initiate gait training.   Skilled Therapeutic Interventions/Progress Updates:    Tx focused on functional mobility training, gait with UE support and NMR via forced use, manual facilitation, and multi-modal cues. [facility] to [facility].  Pt instructed in bil LE technique, needing Min A for 30' before fatigue. PT switched WC to 20x18" and with more structured back, but he still may need larger. Please continue to assess.   Gait initiated with +2 in [place] walker, but proceeded to three muskateers technique after a few steps due to cumbersome equipment. Gait x12' with manual facilitation for weight shifting and verbal cues for sequence. Gait marked by decreased step clearance and length as well as flexed posture and knees in stance. Pt had no evidence of frank knee buckling but flexion increased with fatigue and PT decided to sit in close WC follow.  Squat pivot transfers WC<>mat with Max A and demo cues for technique and sequence. All of pt's movements notable for retropulsion during transition, thus he came to sit on wheel x1. Pt may be more comfortable with stand-pivot. Pt demonstrated good recall as he continued to attempt reciprocal scooting and using the steps instructed.   Serial sit<>stands for blocked practice from [facility], moving through half-squat for practice with anterior translation over BOS. When he is able to move through forward flexion, he only needed Mod A to rise and max cues for slow descent.   Standing frame x6 min for functional  strengthening and mini-squats. Pt performed LUE peg placing task for [facility] in standing.   Pt performed kicking task in supported and unsupported sitting during rest breaks. Pt needed only S for sitting balance within BOS.  Pt left up in [facility] with all needs in reach.    Therapy Documentation Precautions:  Precautions Precautions: Fall, Cervical Precaution Comments: no brace required Required Braces or Orthoses: Other Brace/Splint Other Brace/Splint: pt has RUE resting hand splint at night & dynamic hand splint for during the day Restrictions Weight Bearing Restrictions: No General:   Vital Signs: Therapy Vitals Temp: 98.2 F (36.8 C) Temp Source: Oral Pulse Rate: 68 Resp: 16 BP: 106/71 Patient Position (if appropriate): Sitting Oxygen Therapy SpO2: 97 % O2 Device: Not Delivered Pain: none     See Function Navigator for Current Functional Status.   Therapy/Group: Individual Therapy  Elisse Pennick Virl CageyM  Shaquina Gillham, PT, DPT  06/26/2016, 2:12 PM

## 2016-06-27 ENCOUNTER — Inpatient Hospital Stay (HOSPITAL_COMMUNITY): Payer: Self-pay | Admitting: Physical Therapy

## 2016-06-27 ENCOUNTER — Inpatient Hospital Stay (HOSPITAL_COMMUNITY): Payer: Self-pay

## 2016-06-27 ENCOUNTER — Encounter (HOSPITAL_COMMUNITY): Payer: Self-pay | Admitting: Psychology

## 2016-06-27 ENCOUNTER — Inpatient Hospital Stay (HOSPITAL_COMMUNITY): Payer: BLUE CROSS/BLUE SHIELD | Admitting: Speech Pathology

## 2016-06-27 ENCOUNTER — Encounter (HOSPITAL_COMMUNITY): Payer: Self-pay

## 2016-06-27 MED ORDER — BACLOFEN 5 MG HALF TABLET
15.0000 mg | ORAL_TABLET | Freq: Three times a day (TID) | ORAL | Status: DC
  Administered 2016-06-27 – 2016-06-30 (×9): 15 mg via ORAL
  Filled 2016-06-27 (×9): qty 1

## 2016-06-27 NOTE — Progress Notes (Signed)
Occupational Therapy Session Note  Patient Details  Name: Steve Koch MRN: [id number] Date of Birth: [birth year]  Today's Date: 06/27/2016 OT Individual Time: 0730-0800 OT Individual Time Calculation (min): 30 min    Short Term Goals: Week 1:  OT Short Term Goal 1 (Week 1): Pt will perform toilet transfer with max A in order to decrease level of assistance needed with transfer. OT Short Term Goal 2 (Week 1): Pt will perform LB clothing management with mod A for standing balance.  OT Short Term Goal 3 (Week 1): Pt will perform LB dressing with mod A.  Skilled Therapeutic Interventions/Progress Updates:    1:1. Pt with no c/o pain. Pt stand pivot transfer in stedy with MAX A with VC for foot placement, trunk flexion and A to position LUE on stedy bar to pull up. Pt requires multiple attempts to power up. Pt brushes teeth with HOH A to reach and obtain items and VC to use teeth to open toothpaste top. Exited session with pt seated in w/c with call light in reach and all needs met.   Therapy Documentation Precautions:  Precautions Precautions: Fall, Cervical Precaution Comments: no brace required Required Braces or Orthoses: Other Brace/Splint Other Brace/Splint: pt has RUE resting hand splint at night & dynamic hand splint for during the day Restrictions Weight Bearing Restrictions: No  See Function Navigator for Current Functional Status.   Therapy/Group: Individual Therapy  Tonny Branch 06/27/2016, 10:27 AM

## 2016-06-27 NOTE — Progress Notes (Signed)
Physical Therapy Session Note  Patient Details  Name: Steve Koch MRN: [id number] Date of Birth: 12-22-1960  Today's Date: 06/27/2016 PT Individual Time: 1300-1400 PT Individual Time Calculation (min): 60 min   Short Term Goals: Week 1:  PT Short Term Goal 1 (Week 1): Pt will complete sit<>stand transfer with max assist +1. PT Short Term Goal 2 (Week 1): Pt will complete bed<>chair with max assist +1.  PT Short Term Goal 3 (Week 1): Pt will initiate gait training.   Skilled Therapeutic Interventions/Progress Updates: Pt received seated in w/c, c/o pain in L shoulder but does not rate; agreeable to treatment. Propelled to gym totalA for energy conservation. Assessed MMT, MAS and DTRs as described in flowsheets. Sit <>stand x4 trials from w/c with small red wedge under forefoot for gastroc/soleus stretch, facilitation of closed chair dorsiflexion for ankle strategy and righting reactions, and anterior weight shift with tactile cueing at pelvis for anterior weight shift. Trial on level ground with demonstration of improved ankle stability and anterior weight shift over feet. Standing weight shifts R/L with modA +2 with cueing to maintain tall stance. Gait x2 trials 15' each with +2 three muskateers assist and close w/c follow for safety. Pt demonstrates poor coordination between weight shift to contralateral side and ipsilateral stepping resulting in poor foot clearance, near LOB. Pt becomes very fearful of falling and when nearly loses balance he lowers COM, difficult to correct despite max multimodal cueing. Improved with repetition; will require high intensity repetition to improve strength, coordination and confidence with activity. Discussed briefly with wife current functional level, goals. Remained seated in w/c at end of session, wife present and all needs in reach.      Therapy Documentation Precautions:  Precautions Precautions: Fall, Cervical Precaution Comments: no brace  required Required Braces or Orthoses: Other Brace/Splint Other Brace/Splint: pt has RUE resting hand splint at night & dynamic hand splint for during the day Restrictions Weight Bearing Restrictions: No   See Function Navigator for Current Functional Status.   Therapy/Group: Individual Therapy  Vista Lawmanlizabeth J Tygielski 06/27/2016, 2:26 PM

## 2016-06-27 NOTE — Progress Notes (Signed)
Speech Language Pathology Daily Session Note  Patient Details  Name: Steve Koch MRN: [id number] Date of Birth: Feb 18, 1960  Today's Date: 06/27/2016 SLP Individual Time: 1105-1130 SLP Individual Time Calculation (min): 25 min  Short Term Goals: Week 1: SLP Short Term Goal 1 (Week 1): Pt will consume regular textures and thin liquids with mod I use of swallowing precautions and minimal overt s/s of aspiration over 3 consecutive sessions.  SLP Short Term Goal 2 (Week 1): Pt will achieve intelligibilty at the conversational level with supervision verbal cues for increased vocal intensity and overarticulation.   Skilled Therapeutic Interventions: Skilled treatment session focused on addressing speech and swallow goals. SLP facilitated session by providing set-up assist of regular textures and thin liquids, which patient consumed with timely oral clearance and no overt s/s of aspiration.  SLP also facilitated session with Min assist question cues to facilitate self-monitoring and correcting of speech intelligibility errors with use of over atriculation and slower pace with consonant clusters at the conversation level.  Continue with current plan of care.    Function:  Eating Eating   Modified Consistency Diet: No Eating Assist Level: Set up assist for   Eating Set Up Assist For: Opening containers       Cognition Comprehension Comprehension assist level: Follows complex conversation/direction with no assist  Expression   Expression assist level: Expresses basic 75 - 89% of the time/requires cueing 10 - 24% of the time. Needs helper to occlude trach/needs to repeat words.  Social Interaction Social Interaction assist level: Interacts appropriately with others with medication or extra time (anti-anxiety, antidepressant).  Problem Solving Problem solving assist level: Solves complex problems: With extra time  Memory Memory assist level: Complete Independence: No helper    Pain Pain  Assessment Pain Assessment: No/denies pain  Therapy/Group: Individual Therapy  Charlane FerrettiMelissa Randall Rampersad, M.A., CCC-SLP [phone]  Steve Koch 06/27/2016, 5:22 PM

## 2016-06-27 NOTE — Progress Notes (Signed)
Occupational Therapy Session Note  Patient Details  Name: Steve Koch MRN: [id number] Date of Birth: [birth year]  Today's Date: 06/27/2016 OT Individual Time: 0900-1025 OT Individual Time Calculation (min): 85 min    Short Term Goals: Week 1:  OT Short Term Goal 1 (Week 1): Pt will perform toilet transfer with max A in order to decrease level of assistance needed with transfer. OT Short Term Goal 2 (Week 1): Pt will perform LB clothing management with mod A for standing balance.  OT Short Term Goal 3 (Week 1): Pt will perform LB dressing with mod A.  Skilled Therapeutic Interventions/Progress Updates:    Pt engaged in BADL retraining including bathing at shower level and dressing with sit<>stand from w/c Steve Koch(Stedy).  Pt transferred to shower with Stedy and remained seated on Stedy during shower with focus on continued BLE weight bearing.  Pt able to bathe abdomen and required assistance with other body parts.  Pt initiated threading BUE into sleeves and assisted with pulling shirt over head.  Pt required tot A for LB dressing tasks.  Pt required max A +2 for sit<>stand with Stedy.  Pt transitioned to RUE table tasks with focus on pincher grasp and grasping small sponge squares and moving to different location on table.  Pt with significant grasp weakness.  Pt remained in w/c with all needs within reach.   Therapy Documentation Precautions:  Precautions Precautions: Fall, Cervical Precaution Comments: no brace required Required Braces or Orthoses: Other Brace/Splint Other Brace/Splint: pt has RUE resting hand splint at night & dynamic hand splint for during the day Restrictions Weight Bearing Restrictions: No Pain:  Pt denied pain  See Function Navigator for Current Functional Status.   Therapy/Group: Individual Therapy  Steve Koch, Steve Koch 06/27/2016, 10:33 AM

## 2016-06-27 NOTE — Progress Notes (Signed)
Social Work Patient ID: Steve Koch, male   DOB: [birth year], 56 y.o.   MRN: [id number]   Met with pt and wife following team conference.  Both aware and agreeable with targeted d/c date of 6/30 and min assist goals overall.  Very pleased with progress and both very motivated.  Pt does ask about AFOs and encouraged him to speak with MD/PT.  Continue to follow.  Vandora Jaskulski, LCSW

## 2016-06-27 NOTE — Progress Notes (Addendum)
[facility] PHYSICAL MEDICINE & REHABILITATION     PROGRESS NOTE  Subjective/Complaints:  Had a reasonable day yesterday. Legs still tight. Denies frank pain. Asked if b12 could have something to do with his deficits  ROS: pt denies nausea, vomiting, diarrhea, cough, shortness of breath or chest pain  Objective: Vital Signs: Blood pressure 108/71, pulse 61, temperature 97.7 F (36.5 C), temperature source Oral, resp. rate 12, SpO2 96 %. No results found. No results for input(s): WBC, HGB, HCT, PLT in the last 72 hours. No results for input(s): NA, K, CL, GLUCOSE, BUN, CREATININE, CALCIUM in the last 72 hours.  Invalid input(s): CO CBG (last 3)  No results for input(s): GLUCAP in the last 72 hours.  Wt Readings from Last 3 Encounters:  06/21/16 116.9 kg (257 lb 11.5 oz)  03/09/16 116.1 kg (256 lb)  03/07/16 116.3 kg (256 lb 8 oz)    Physical Exam:  BP 108/71 (BP Location: Right Arm)   Pulse 61   Temp 97.7 F (36.5 C) (Oral)   Resp 12   SpO2 96%  Constitutional: He appears well-developed and well-nourished.  HENT: Normocephalic and atraumatic. Bruises on face/around eyes Eyes: EOMI. No discharge.  Cardiovascular: RRR without murmur. No JVD Respiratory:CTA Bilaterally without wheezes or rales. Normal effort  GI: Soft. Bowel sounds are normal.  Musculoskeletal: He exhibits edema. He exhibits no tenderness. Tight heel cords on right.   Neurological: He is alert and oriented.  Dysarthria Motor: RUE: 1-2/5 prox to distal. Wearing glove/resting splint again LUE: Shoulder abduction, elbow flexion/extension 4/5, wrist, hand grip 2+/5, contractures of digits 3 and 4.  B/l LE 4-/5 prox to distal.  Resting tone in ue 1-2/4 and 2-3/4 LE's Skin: Skin is warm and dry.  Psychiatric: Normal mood and behavior.  Cognition and memory are not impaired  Assessment/Plan: 1. Functional deficits secondary to cervical myelopathy which require 3+ hours per day of interdisciplinary therapy in a  comprehensive inpatient rehab setting. Physiatrist is providing close team supervision and 24 hour management of active medical problems listed below. Physiatrist and rehab team continue to assess barriers to discharge/monitor patient progress toward functional and medical goals.  Function:  Bathing Bathing position Bathing activity did not occur: Refused Position: Sitting EOB  Bathing parts Body parts bathed by patient: Abdomen Body parts bathed by helper: Right arm, Left arm, Chest, Front perineal area, Buttocks, Right upper leg, Left upper leg, Right lower leg, Left lower leg, Back  Bathing assist Assist Level: 2 helpers      Upper Body Dressing/Undressing Upper body dressing Upper body dressing/undressing activity did not occur: Refused What is the patient wearing?: Pull over shirt/dress       Pull over shirt/dress - Perfomed by helper: Thread/unthread right sleeve, Thread/unthread left sleeve, Put head through opening, Pull shirt over trunk        Upper body assist Assist Level: Touching or steadying assistance(Pt > 75%)      Lower Body Dressing/Undressing Lower body dressing   What is the patient wearing?: Shoes, Pants, Socks       Pants- Performed by helper: Thread/unthread right pants leg, Pull pants up/down, Thread/unthread left pants leg       Socks - Performed by helper: Don/doff right sock, Don/doff left sock   Shoes - Performed by helper: Don/doff right shoe, Don/doff left shoe          Lower body assist Assist for lower body dressing: 2 Designer, multimedia activity  did not occur: Safety/medical concerns   Toileting steps completed by helper: Adjust clothing prior to toileting, Performs perineal hygiene, Adjust clothing after toileting    Toileting assist Assist level: Two helpers   Transfers Chair/bed transfer   Chair/bed transfer method: Stand pivot Chair/bed transfer assist level: Maximal assist (Pt 25 - 49%/lift and  lower) Chair/bed transfer assistive device: Mechanical lift (per NT report) Mechanical lift: Engineer, petroleumara   Locomotion Ambulation Ambulation activity did not occur: Safety/medical concerns   Max distance: 12 Assist level: 2 helpers   Education administratorWheelchair Wheelchair activity did not occur: Safety/medical concerns Type: Manual Max wheelchair distance: 30 Assist Level: Touching or steadying assistance (Pt > 75%)  Cognition Comprehension Comprehension assist level: Follows complex conversation/direction with no assist  Expression Expression assist level: Expresses complex 90% of the time/cues < 10% of the time  Social Interaction Social Interaction assist level: Interacts appropriately with others - No medications needed.  Problem Solving Problem solving assist level: Solves complex problems: Recognizes & self-corrects  Memory Memory assist level: Complete Independence: No helper    Medical Problem List and Plan: 1.  Deficits in mobility as well as inability to carry out ADL tasks secondary to cervical myelopathy, hx of ACDF in February 2018.  Cont CIR therapies  May use bone stimulator  -team conference today 2.  DVT Prophylaxis/Anticoagulation: Pharmaceutical: Lovenox 3. Pain Management: tylenol prn.  4. Mood: LCSW to follow for evaluation and support. Team to provide ego support.  5. Neuropsych: This patient is capable of making decisions on his own behalf. 6. Skin/Wound Care: Monitor daily. Routine pressure relief measures 7. Fluids/Electrolytes/Nutrition: Monitor I/O.              BMP WNL 6/9 8. HTN: Monitor BP bid. Continue Norvasc.  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 Vitamin B 12 deficiency: on supplement.  11. H/O of  Anxiety: Monitor for now--does not want to start any meds. Trazodone at nights prn to help manage anxiety at nights.   Stable at present 12. Hypoalbuminemia             Supplement initiated  6/9  LOS (Days) 4 A FACE TO FACE EVALUATION WAS PERFORMED  Steve Koch T 06/27/2016 8:35 AM

## 2016-06-27 NOTE — Consult Note (Signed)
Neuropsychological Consultation   Patient:   Steve Koch   DOB:   [birth year]  MR Number:  [id number]  [facility]  [facility] [id number] [place] [address] Dept: [phone] Loc: [phone]           Date of Service:   06/27/2016  Start Time:   2 PM End Time:   3 PM  Provider/Observer:  Steve Phenix, Psy.D.       Clinical Neuropsychologist       Billing Code/Service: [phone] 4 Units  Chief Complaint:    The patient has been having more anxiety and stress due to his current neurological and medical status.  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  Had surgery on 03/07/2016.  Continued to have issues with difficulty walking and sustained a fall and struck head on 06/20/2016.  Dysarthria, arm weakness and leg weakness.    Reason for Service:  Steve Koch is a 56 year old male with fall in Feb 2018, surgery for neck and progressive worsening.  Fall 06/20/2016 and dysarthria and continued extremity weakness.  Bellow is the HPI for the current admission.  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 History taken from chart review and patient. He underwent ACDF C5-C7 02/2016 and has had progressive weakness BLE.  MRI 6/1 done revealing stable ACDF with persistent abutment to right ventral cord due to osteophytes C5/6, no definite cord signal abnormality and T2-T3 left focal paracentral disc protrusion. He has had difficulty walking and sustained a fall and struck his head and was admitted on [facility] 06/20/16 for work up. CT head negative. Mild dysarthria of questionable etiology. Neurology recommended EMG/NCS for work up--done recently and results unavailable.  Vitamin B 12 levels--228 and supplement added.  HIV- NR.  CK and TSH levels WNL. Acetylcholine AB pending.  Intensive therapy recommended due to cervical myelopathy with deficits in mobility as well as inability to carry out ADL tasks. CIR consulted and patient felt to be a good candidate.   Current Status:  The patient reports that he is very worried about his physical status and not being able to move his arm and both arms and legs very weak.  The patient described his fall at work and fall striking his head this June.  Reliability of Information: Information from 1 hour face to face with the patient and his wife, review of medical records and discussion of case with treatment team members.  Behavioral Observation: Steve Koch  presents as a 56 y.o.-year-old Right Caucasian Male who appeared his stated age. his dress was Appropriate and he was Well Groomed and his manners were Appropriate to the situation.  his participation was indicative of Appropriate behaviors.  There were physical disabilities noted.  he displayed an appropriate level of cooperation and motivation.     Interactions:    Active Appropriate  Attention:   abnormal and attention span appeared shorter than expected for age  Memory:   abnormal; global memory impairment noted  Visuo-spatial:  within normal limits  Speech (Volume):  low  Speech:   slurred garbled; dysarthra  Thought Process:  Coherent and Disorganized  Though Content:  WNL;   Orientation:   person, place,  time/date and situation  Judgment:   Fair  Planning:   Fair  Affect:    Anxious and Depressed  Mood:    Anxious and Depressed  Insight:   Fair  Intelligence:   normal  Marital Status/Living: The patient is married and wife was present for clinical interview.  Current Employment: The patient has been working for [employer].  Substance Use:  No concerns of substance abuse are reported.    Education:   HS Graduate  Medical History:   Past  Medical History:  Diagnosis Date  . Anxiety    situational with injury  . Carpal tunnel syndrome    right hand  . GERD (gastroesophageal reflux disease)   . Hypertension   . 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 Pneumonia Mother   . Hypertension Mother   . Diabetes Father   . Cancer Father     Risk of Suicide/Violence: low The patient denies SI or HI  Impression/DX:  Steve Koch is a 56 year old male that had an initial fall at work resulting in weakness in limbs.  Found to have canal stenosis and cord compression c5-c7.  Had surgery on 2/18.  Continued to have issues with walking and arm weakness.  Fall again 06/20/2016 striking head and developing dysarthria.   Disposition/Plan:  Worked with patient regarding coping skill and issues of anxiety and developing depressive symptoms.  Will continue wto work with the patient next week as well.  Diagnosis:    Anxiety following injury        Electronically Signed   _______________________ Steve Koch, Psy.D.

## 2016-06-27 NOTE — Patient Care Conference (Signed)
Inpatient RehabilitationTeam Conference and Plan of Care Update Date: 06/27/2016   Time: 2:10 PM    Patient Name: Steve Koch      Medical Record Number: [id number]  Date of Birth: 08-13-1960 Sex: Male         Room/Bed: 4W17C/4W17C-01 Payor Info: Payor: BLUE CROSS BLUE SHIELD / Plan: BCBS OTHER / Product Type: *No Product type* /    Admitting Diagnosis: Cervical Myelap  Admit Date/Time:  06/23/2016  5:40 PM Admission Comments: No comment available   Primary Diagnosis:  <principal problem not specified> Principal Problem: <principal problem not specified>  Patient Active Problem List   Diagnosis Date Noted  . Benign essential HTN   . Spastic quadriparesis (HCC)   . Vitamin B 12 deficiency   . Generalized anxiety disorder   . Hypoalbuminemia due to protein-calorie malnutrition (HCC)   . Atrophy of muscle of both hands   . Fall 06/21/2016  . Cervical myelopathy (HCC) 06/21/2016  . Essential hypertension 06/21/2016  . Sensory disturbance 06/21/2016  . Gait instability 06/21/2016  . Hyperreflexia   . Poor tolerance for ambulation   . Myelopathy (HCC) 03/09/2016    Expected Discharge Date: Expected Discharge Date: 07/15/16  Team Members Present: Physician leading conference: Dr. Faith Rogue Social Worker Present: Amada Jupiter, LCSW Nurse Present: Ronny Bacon, RN PT Present: Alyson Reedy, Nita Sickle, PT OT Present: Ardis Rowan, COTA;Stephanie Schlosser, OT SLP Present: Fae Pippin, SLP PPS Coordinator present : Tora Duck, RN, CRRN     Current Status/Progress Goal Weekly Team Focus  Medical   here with excaerbation of chronic myelopathy, debility, spasticity  improve functional mobility, reduce spasms  nutrition, bp control, spasticity   Bowel/Bladder   Continent of bowel & bladder, LBM 06/22/16, given Miralax on previous shift, suppository if no results by morning  remain continent  continue to monitor & obtain regular BMs   Swallow/Nutrition/  Hydration   regular textures and thin liquids   least restrictive PO   monitor for tolerance    ADL's   bathing/dressing (EOB)-max A/tot A; sit<>stand-tot A +2/max A with Stedy; BUE weakness  min A overall  sitting balance, sit<>stand/functional transfers, standing balance, BADL retraining, BUE NMR   Mobility   maxA transfers, maxA +2 sit <>stand and gait  minA overall, short distance gait with LRAD  LE strength/ROM, core strengthening and dynamic balance, activity tolerance   Communication   speech intelligibility at the phrase-sentence level   intelligible speech at the conversation >90%  carryover of specch intelligibility strategies    Safety/Cognition/ Behavioral Observations            Pain   No c/o pain, has Tylenol & tramadol prn, has not used it, has baclofen scheduled TID  pain scale <3  continue to assess & tereat as needed   Skin   has a toenail fungus to the left great toe, no other areas of skin break down  no new areas of skin break down  continue to asses q shift    Rehab Goals Patient on target to meet rehab goals: Yes *See Care Plan and progress notes for long and short-term goals.  Barriers to Discharge: prir deficits, profound R>L UE weakness    Possible Resolutions to Barriers:  adaptive equipment, pt and family ed    Discharge Planning/Teaching Needs:  Pt to d/c home with wife who can continue to provide 24/7 assistance.  Teaching ongoing with wife.   Team Discussion:  Substantial RUE weakness,  Spasticity in LE and  severe, spastic tetraplegia per MD.  May need med assist with bowels.  Dysarthria - cause?  Max assist +2 squat-pivot.  Very fearful of falling.  Max - total for ADLs.  Very motivated and hoping for min assist goals.  Revisions to Treatment Plan:  None   Continued Need for Acute Rehabilitation Level of Care: The patient requires daily medical management by a physician with specialized training in physical medicine and rehabilitation for the  following conditions: Daily direction of a multidisciplinary physical rehabilitation program to ensure safe treatment while eliciting the highest outcome that is of practical value to the patient.: Yes Daily medical management of patient stability for increased activity during participation in an intensive rehabilitation regime.: Yes Daily analysis of laboratory values and/or radiology reports with any subsequent need for medication adjustment of medical intervention for : Post surgical problems;Neurological problems;Other  Yarden Hillis 06/27/2016, 5:22 PM

## 2016-06-27 NOTE — Progress Notes (Signed)
Social Work  Social Work Assessment and Plan  Patient Details  Name: Steve Koch MRN: [id number] Date of Birth: 1960/11/29  Today's Date: 06/26/2016  Problem List:  Patient Active Problem List   Diagnosis Date Noted  . Benign essential HTN   . Spastic quadriparesis (HCC)   . Vitamin B 12 deficiency   . Generalized anxiety disorder   . Hypoalbuminemia due to protein-calorie malnutrition (HCC)   . Atrophy of muscle of both hands   . Fall 06/21/2016  . Cervical myelopathy (HCC) 06/21/2016  . Essential hypertension 06/21/2016  . Sensory disturbance 06/21/2016  . Gait instability 06/21/2016  . Hyperreflexia   . Poor tolerance for ambulation   . Myelopathy (HCC) 03/09/2016   Past Medical History:  Past Medical History:  Diagnosis Date  . Anxiety    situational with injury  . Carpal tunnel syndrome    right hand  . GERD (gastroesophageal reflux disease)   . Hypertension   . PONV (postoperative nausea and vomiting)    Past Surgical History:  Past Surgical History:  Procedure Laterality Date  . ANTERIOR CERVICAL DECOMP/DISCECTOMY FUSION N/A 03/09/2016   Procedure: Anterior Cervical Discectomy Fusion Cervial 5-7;  Surgeon: Venita Lickahari Brooks, MD;  [facility];  Service: Orthopedics;  Laterality: N/A;  Requests for 3.5 hrs  . BACK SURGERY    . COLONOSCOPY    . HAND SURGERY Left   . HERNIA REPAIR Right    inguinal  . KNEE ARTHROSCOPY Right    Social History:  reports that he quit smoking about 10 years ago. He has never used smokeless tobacco. He reports that he drinks alcohol. He reports that he does not use drugs.  Family / Support Systems Marital Status: Married How Long?: 18 yrs (2nd marriage for both) Patient Roles: Spouse, Parent, Other (Comment) (grandparent) Spouse/Significant Other: wife, Laurann MontanaDawn Slape @ (C) [phone] Children: Pt and wife have a combined 5 adult children.  Wife's daughter living ~ 1 mile from them and others in the area Anticipated Caregiver:  Spouse Ability/Limitations of Caregiver: None Caregiver Availability: 24/7 Family Dynamics: Wife very supportive and encouraging.  Has been taking pt to work with her for several months in order to provide the supervision and assist he needs.  They note that their children are also very supportive but they are working.  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  Pt very happy to finally be received therapies and hopeful he can make good gains.  He describes feeling "trapped" in his body and having to depend so heavily on his wife for support.  Pt also reports that he has had much anxiety and some "panic attacks" but no formal counseling - have referred for neuropsych consult. Recent Psychosocial Issues: Lengthy decline in function. Pyschiatric History: Anxiety beginning with functional decline Substance Abuse History: None  Patient / Family Perceptions, Expectations & Goals Pt/Family understanding of illness & functional limitations: Pt and wife have general understanding of his spinal injuries, surgery  and current functional limitation/ need for CIR. Premorbid pt/family roles/activities: Pt having multiple falls since Feb and was having to go to wife's work so she could provide needed assistance. Anticipated changes in roles/activities/participation: Wife to continue to  provide 24/7 support, however, hopeful lower physical burden. Pt/family expectations/goals: "I just want to get as much of my independence back as I can."  [employer]: None Premorbid Home Care/DME Agencies: None Transportation available at discharge: yes Resource referrals recommended: Neuropsychology  Discharge Planning Living Arrangements: Spouse/significant other Support Systems: Spouse/significant other, Children, Other relatives, Friends/neighbors Type of Residence: Private residence Insurance Resources: Media planner (specify) Herbalist) Financial Resources: Family Support Financial Screen Referred: No Living Expenses: Database administrator Management: Spouse Does the patient have any problems obtaining your medications?: No Home Management: Wife is primary home support Patient/Family Preliminary Plans: Pt to return home with wife providing any needed assistance. Social Work Anticipated Follow Up Needs: HH/OP Expected length of stay: 2-3 weeks  Clinical Impression Very unfortunate gentleman here with significant functional deficits which appear to have begun with a fall in Feb and with steady decline since requiring 24/7 assistance from wife.  He admits to build in his own anxiety as he has declined in function.  He is very motivated for therapies and hopeful to regain some level of independence.  Wife very supportive and able to continue to provide care.  Will follow for support and d/c planning needs.  Mardell Cragg 06/26/2016, 5:14 PM

## 2016-06-28 ENCOUNTER — Inpatient Hospital Stay (HOSPITAL_COMMUNITY): Payer: Self-pay | Admitting: Physical Therapy

## 2016-06-28 ENCOUNTER — Inpatient Hospital Stay (HOSPITAL_COMMUNITY): Payer: Self-pay | Admitting: *Deleted

## 2016-06-28 ENCOUNTER — Inpatient Hospital Stay (HOSPITAL_COMMUNITY): Payer: BLUE CROSS/BLUE SHIELD | Admitting: Speech Pathology

## 2016-06-28 DIAGNOSIS — F0781 Postconcussional syndrome: Secondary | ICD-10-CM

## 2016-06-28 LAB — CBC
HCT: 41.2 % (ref 39.0–52.0)
Hemoglobin: 14.5 g/dL (ref 13.0–17.0)
MCH: 30 pg (ref 26.0–34.0)
MCHC: 35.2 g/dL (ref 30.0–36.0)
MCV: 85.3 fL (ref 78.0–100.0)
PLATELETS: 266 10*3/uL (ref 150–400)
RBC: 4.83 MIL/uL (ref 4.22–5.81)
RDW: 14.4 % (ref 11.5–15.5)
WBC: 9 10*3/uL (ref 4.0–10.5)

## 2016-06-28 LAB — BASIC METABOLIC PANEL
Anion gap: 9 (ref 5–15)
BUN: 16 mg/dL (ref 6–20)
CHLORIDE: 103 mmol/L (ref 101–111)
CO2: 25 mmol/L (ref 22–32)
CREATININE: 0.88 mg/dL (ref 0.61–1.24)
Calcium: 8.8 mg/dL — ABNORMAL LOW (ref 8.9–10.3)
GFR calc Af Amer: >60 mL/min (ref >60)
GFR calc non Af Amer: >60 mL/min (ref >60)
GLUCOSE: 105 mg/dL — AB (ref 65–99)
Potassium: 4.6 mmol/L (ref 3.5–5.1)
SODIUM: 137 mmol/L (ref 135–145)

## 2016-06-28 NOTE — Progress Notes (Signed)
Occupational Therapy Session Note  Patient Details  Name: Steve Koch MRN: [id number] Date of Birth: 11/11/1960  Today's Date: 06/28/2016 OT Individual Time: 1000-1100 OT Individual Time Calculation (min): 60 min    Short Term Goals: Week 1:  OT Short Term Goal 1 (Week 1): Pt will perform toilet transfer with max A in order to decrease level of assistance needed with transfer. OT Short Term Goal 2 (Week 1): Pt will perform LB clothing management with mod A for standing balance.  OT Short Term Goal 3 (Week 1): Pt will perform LB dressing with mod A.  Skilled Therapeutic Interventions/Progress Updates:    Pt resting in w/c upon arrival.  Recreational Therapist present with OTA during session. Initial focus on donning shirt.  Pt required 10 mins to thread BUE into shirt sleeves without assistance.  Pt required assistance to complete task.  Pt transitioned to therapy gym and engaged in sit<>stand with focus anterior lean in preparation for sit>stand.  Pt requires max verbal cues to maintain forward leaning position during task.  Pt performed sit<>stand from EOM with min A X 2, mod A X3, and max A X 1.  Pt requires max verbal cues for posture/balance while standing.  Pt returned to room and remained in w/c with all needs within reach.   Therapy Documentation Precautions:  Precautions Precautions: Fall, Cervical Precaution Comments: no brace required Required Braces or Orthoses: Other Brace/Splint Other Brace/Splint: pt has RUE resting hand splint at night & dynamic hand splint for during the day Restrictions Weight Bearing Restrictions: No Pain: Pain Assessment Pain Assessment: No/denies pain  See Function Navigator for Current Functional Status.   Therapy/Group: Individual Therapy  [name], Ashling Roane Chappell 06/28/2016, 11:04 AM

## 2016-06-28 NOTE — Progress Notes (Signed)
Physical Therapy Session Note  Patient Details  Name: Steve ShadCharles Hoshino MRN: [id number] Date of Birth: 03-30-1960  Today's Date: 06/28/2016 PT Individual Time: 0800-0900 and 1300-1345 PT Individual Time Calculation (min): 60 min and 45 min (total 105 min)   Short Term Goals: Week 1:  PT Short Term Goal 1 (Week 1): Pt will complete sit<>stand transfer with max assist +1. PT Short Term Goal 2 (Week 1): Pt will complete bed<>chair with max assist +1.  PT Short Term Goal 3 (Week 1): Pt will initiate gait training.   Skilled Therapeutic Interventions/Progress Updates: Tx 1: Pt received supine in bed, denies pain and agreeable to treatment. Supine>sit with maxA for upper body management to pull up to sitting; able to get LEs mostly off bed without assist, increased time. Sit>Stand in stedy maxA +2. Sit <>stand from stedy seat x8 reps with focus on anterior weight shift, bringing chest over toes with min guard overall however several unsuccessful trials d/t poor weight shift. Dycem used on stedy to improve grip and reduce reliance on therapists for stabilizing hands. 3 additional trials from elevated bed height with modA +2 and more unsuccessful trials, with pt also likely fatiguing. Transported to/from gym in w/c totalA. Stedy transfer to mat table as above. Semi-reclined situps x10 reps with modA, cues and education regarding exhalation during exertion with improved success. Forward reaching with LUE to reach targets, and BUE reaching forward pushing stability ball with focus on anterior weight shift. Transitioned ball pushes into sit >stand with modA +2 from low seat height; 3 trials before fatigued. Gait x45' with three muskateers +2 assist with facilitation for weight shifting reduced with repetition; w/c follow for safety. Returned to room as above; remained seated in w/c at end of session, all needs in reach.   Tx 2: Pt received seated in w/c, denies pain and agreeable to treatment. Transported to/from  gym totalA. Sit <>stand +2A three muskateers. Gait trial with eva walker and +2A x45'; facilitation for weight shifting and management of eva walker. Second gait trial x76' with BLE GRAFOs with minA +2 for management of eva walker, reduced assist to min guard for weight shifting. Pt demonstrates improving righting reactions and balance corrections with less cueing required. Pt instructed in LE exercises including hip flexion marching, long arc quad, glute sets, ankle plantarflexion/dorsiflexion x10 reps each. Provided handout to pt with written directions/pictures for HEP performance independently. Educated pt in energy conservation and strategies to plan LE exercises around scheduled therapy time. Remained seated in w/c at end of session, all needs in reach.      Therapy Documentation Precautions:  Precautions Precautions: Fall, Cervical Precaution Comments: no brace required Required Braces or Orthoses: Other Brace/Splint Other Brace/Splint: pt has RUE resting hand splint at night & dynamic hand splint for during the day Restrictions Weight Bearing Restrictions: No Pain: Pain Assessment Pain Assessment: No/denies pain   See Function Navigator for Current Functional Status.   Therapy/Group: Individual Therapy  Vista Lawmanlizabeth J Tygielski 06/28/2016, 9:57 AM

## 2016-06-28 NOTE — Progress Notes (Signed)
[place] PHYSICAL MEDICINE & REHABILITATION     PROGRESS NOTE  Subjective/Complaints:  Had a good night. Feels that baclofen increase has helped legs. Able to sleep last night  ROS: pt denies nausea, vomiting, diarrhea, cough, shortness of breath or chest pain  Objective: Vital Signs: Blood pressure 138/70, pulse 61, temperature 97.7 F (36.5 C), temperature source Oral, resp. rate 18, height 6\' 1"  (1.854 m), weight 116.5 kg (256 lb 13.4 oz), SpO2 94 %. No results found.  Recent Labs  06/28/16 0452  WBC 9.0  HGB 14.5  HCT 41.2  PLT 266    Recent Labs  06/28/16 0452  NA 137  K 4.6  CL 103  GLUCOSE 105*  BUN 16  CREATININE 0.88  CALCIUM 8.8*   CBG (last 3)  No results for input(s): GLUCAP in the last 72 hours.  Wt Readings from Last 3 Encounters:  06/23/16 116.5 kg (256 lb 13.4 oz)  06/21/16 116.9 kg (257 lb 11.5 oz)  03/09/16 116.1 kg (256 lb)    Physical Exam:  BP 138/70 (BP Location: Right Arm)   Pulse 61   Temp 97.7 F (36.5 C) (Oral)   Resp 18   Ht 6\' 1"  (1.854 m)   Wt 116.5 kg (256 lb 13.4 oz)   SpO2 94%   BMI 33.89 kg/m  Constitutional: He appears well-developed and well-nourished.  HENT: Normocephalic and atraumatic. Bruises on face/around eyes Eyes: EOMI. No discharge.  Cardiovascular: RRR without murmur. No JVD  Respiratory: CTA Bilaterally without wheezes or rales. Normal effort GI: Soft. Bowel sounds are normal.  Musculoskeletal: He exhibits edema. He exhibits no tenderness. Tight heel cords on right.   Neurological: He is alert and oriented.  Dysarthric speech Motor: RUE: 1-2/5 prox to distal. Wearing glove/resting splint  LUE: Shoulder abduction, elbow flexion/extension 4/5, wrist, hand grip 2+/5, contractures of digits 3 and 4.  B/l LE 4-/5 prox to distal.  Resting tone in ue 1/4 and 1/4 LE's Skin: Skin is warm and dry.  Psychiatric: Normal mood and behavior.  Cognition and memory are not impaired  Assessment/Plan: 1. Functional  deficits secondary to cervical myelopathy which require 3+ hours per day of interdisciplinary therapy in a comprehensive inpatient rehab setting. Physiatrist is providing close team supervision and 24 hour management of active medical problems listed below. Physiatrist and rehab team continue to assess barriers to discharge/monitor patient progress toward functional and medical goals.  Function:  Bathing Bathing position Bathing activity did not occur: Refused Position: Systems developer parts bathed by patient: Abdomen Body parts bathed by helper: Right arm, Left arm, Chest, Front perineal area, Buttocks, Right upper leg, Left upper leg, Right lower leg, Left lower leg, Back  Bathing assist Assist Level: 2 helpers      Upper Body Dressing/Undressing Upper body dressing Upper body dressing/undressing activity did not occur: Refused What is the patient wearing?: Pull over shirt/dress       Pull over shirt/dress - Perfomed by helper: Thread/unthread right sleeve, Thread/unthread left sleeve, Put head through opening, Pull shirt over trunk        Upper body assist Assist Level: Touching or steadying assistance(Pt > 75%)      Lower Body Dressing/Undressing Lower body dressing   What is the patient wearing?: Shoes, Pants, Socks       Pants- Performed by helper: Thread/unthread right pants leg, Pull pants up/down, Thread/unthread left pants leg       Socks - Performed by helper: Don/doff right sock,  Don/doff left sock   Shoes - Performed by helper: Don/doff right shoe, Don/doff left shoe          Lower body assist Assist for lower body dressing: 2 Helpers      Toileting Toileting Toileting activity did not occur: Safety/medical concerns   Toileting steps completed by helper: Adjust clothing prior to toileting, Performs perineal hygiene, Adjust clothing after toileting    Toileting assist Assist level: Two helpers   Transfers Chair/bed transfer   Chair/bed  transfer method: Stand pivot Chair/bed transfer assist level: Maximal assist (Pt 25 - 49%/lift and lower) Chair/bed transfer assistive device: Mechanical lift Mechanical lift: Stedy   Locomotion Ambulation Ambulation activity did not occur: Safety/medical concerns   Max distance: 15 Assist level: 2 helpers   Education administratorWheelchair Wheelchair activity did not occur: Safety/medical concerns Type: Manual Max wheelchair distance: 30 Assist Level: Touching or steadying assistance (Pt > 75%)  Cognition Comprehension Comprehension assist level: Follows complex conversation/direction with no assist  Expression Expression assist level: Expresses basic 75 - 89% of the time/requires cueing 10 - 24% of the time. Needs helper to occlude trach/needs to repeat words.  Social Interaction Social Interaction assist level: Interacts appropriately with others with medication or extra time (anti-anxiety, antidepressant).  Problem Solving Problem solving assist level: Solves complex problems: With extra time  Memory Memory assist level: Complete Independence: No helper    Medical Problem List and Plan: 1.  Deficits in mobility as well as inability to carry out ADL tasks secondary to cervical myelopathy, hx of ACDF in February 2018.  Cont CIR therapies  May use bone stimulator  -after reviewing his history I feel relatively strongly that dysarthria/speech changes are related to his falls and head trauma/post-concussion symptoms 2.  DVT Prophylaxis/Anticoagulation: Pharmaceutical: Lovenox 3. Pain Management: tylenol prn.  4. Mood: LCSW to follow for evaluation and support. Team to provide ego support.  5. Neuropsych: This patient is capable of making decisions on his own behalf. 6. Skin/Wound Care: Monitor daily. Routine pressure relief measures 7. Fluids/Electrolytes/Nutrition: Monitor I/O.              BMP WNL 6/9 8. HTN: Monitor BP bid. Continue Norvasc.  Controlled 6/10 9. Spastic quadriparesis: has had good  results with increase in baclofen to 15mg  TID  -will hold on PRAFo's at present             Cont bracing/ROM efforts 10. Vitamin B 12 deficiency: on supplement.  11. H/O of  Anxiety: Monitor for now--does not want to start any meds. Trazodone at nights prn to help manage anxiety at nights.   Stable at present 12. Hypoalbuminemia             Supplement initiated 6/9  LOS (Days) 5 A FACE TO FACE EVALUATION WAS PERFORMED  Sayla Golonka T 06/28/2016 8:51 AM

## 2016-06-28 NOTE — Progress Notes (Signed)
Speech Language Pathology Daily Session Note  Patient Details  Name: Andres ShadCharles Mcvicker MRN: [id number] Date of Birth: 12-06-1960  Today's Date: 06/28/2016 SLP Individual Time: 1345-1430 SLP Individual Time Calculation (min): 45 min  Short Term Goals: Week 1: SLP Short Term Goal 1 (Week 1): Pt will consume regular textures and thin liquids with mod I use of swallowing precautions and minimal overt s/s of aspiration over 3 consecutive sessions.  SLP Short Term Goal 2 (Week 1): Pt will achieve intelligibilty at the conversational level with supervision verbal cues for increased vocal intensity and overarticulation.   Skilled Therapeutic Interventions: Skilled treatment session focused on speech communication goals. Pt able to recall speech intelligibility strategies and implement to achieve >75% at simple conversation and with barrier description tasks in a mildly loud environment. Pt was returned to room, left upright in his wheelchair with all needs within reach. Continue per current plan of care.      Function:    Cognition Comprehension Comprehension assist level: Follows complex conversation/direction with no assist  Expression   Expression assist level: Expresses basic 90% of the time/requires cueing < 10% of the time.  Social Interaction Social Interaction assist level: Interacts appropriately with others with medication or extra time (anti-anxiety, antidepressant).  Problem Solving Problem solving assist level: Solves complex problems: With extra time  Memory Memory assist level: Complete Independence: No helper    Pain    Therapy/Group: Individual Therapy   Rivaldo Hineman B. Dreama Saaverton, M.S., CCC-SLP Speech-Language Pathologist   Bettymae Yott 06/28/2016, 3:12 PM

## 2016-06-29 ENCOUNTER — Inpatient Hospital Stay (HOSPITAL_COMMUNITY): Payer: Self-pay

## 2016-06-29 ENCOUNTER — Inpatient Hospital Stay (HOSPITAL_COMMUNITY): Payer: Self-pay | Admitting: Physical Therapy

## 2016-06-29 ENCOUNTER — Inpatient Hospital Stay (HOSPITAL_COMMUNITY): Payer: BLUE CROSS/BLUE SHIELD | Admitting: Speech Pathology

## 2016-06-29 LAB — ACETYLCHOLINE RECEPTOR AB, ALL
Acety choline binding ab: <0.03 nmol/L (ref 0.00–0.24)
Acetylchol Block Ab: 12 % (ref 0–25)
Acetylcholine Modulat Ab: <12 % (ref 0–20)

## 2016-06-29 NOTE — Progress Notes (Signed)
Occupational Therapy Session Note  Patient Details  Name: Steve ShadCharles Baze MRN: [id number] Date of Birth: 1960-09-23  Today's Date: 06/29/2016 OT Individual Time: [id number] OT Individual Time Calculation (min): 55 min    Short Term Goals: Week 1:  OT Short Term Goal 1 (Week 1): Pt will perform toilet transfer with max A in order to decrease level of assistance needed with transfer. OT Short Term Goal 2 (Week 1): Pt will perform LB clothing management with mod A for standing balance.  OT Short Term Goal 3 (Week 1): Pt will perform LB dressing with mod A.  Skilled Therapeutic Interventions/Progress Updates:    Pt engaged in BADL retraining with focus on increased BUE use for functional tasks and weight bearing through BLE while sitting on Stedy.  Additional focus on sit<>stand and standing balance in [place].  Pt continues to require max/tot A for bathing/dressing tasks.  Pt requires max verbal cues for positioning prior to sit<>stand and max A for sit<>stand.  Pt is now able to lift alternating feet to facilitate threading on pants and donning socks/shoes.  Pt threaded BUE into sleeves with more than a reasonable amount of time (10 mins) but required assistance with pulling over head and trunk.   Therapy Documentation Precautions:  Precautions Precautions: Fall, Cervical Precaution Comments: no brace required Required Braces or Orthoses: Other Brace/Splint Other Brace/Splint: pt has RUE resting hand splint at night & dynamic hand splint for during the day Restrictions Weight Bearing Restrictions: No Pain: Pain Assessment Pain Assessment: No/denies pain  See Function Navigator for Current Functional Status.   Therapy/Group: Individual Therapy  [name], Cacey Willow Chappell 06/29/2016, 10:08 AM

## 2016-06-29 NOTE — Progress Notes (Signed)
Physical Therapy Session Note  Patient Details  Name: Steve Koch MRN: [id number] Date of Birth: [birth year]  Today's Date: 06/29/2016 PT Individual Time: 0800-0900 and 1400-1445 PT Individual Time Calculation (min): 60 min and 45 min (total 105 min)   Short Term Goals: Week 1:  PT Short Term Goal 1 (Week 1): Pt will complete sit<>stand transfer with max assist +1. PT Short Term Goal 2 (Week 1): Pt will complete bed<>chair with max assist +1.  PT Short Term Goal 3 (Week 1): Pt will initiate gait training.   Skilled Therapeutic Interventions/Progress Updates: Tx 1: Pt received supine in bed, c/o pain in bilateral shoulders, pre-medicated and does not rate. Supine>sit with pt managing LEs off EOB with S and increased time, maxA and NDT facilitory techniques for bringing trunk up to midline, leading with head/neck positioning to activate core. Sit >stand modA +2 from bed. Stand pivot transfer with platform RUE RW and min/modA for pivotal steps. Dynamic gait in parallel bars sideways walking for focus on hip abduction strength, weight shifting, single limb stance control and BLE coordination; min guard overall, assist for placement of UEs on bar. Standing heel/toe raises 2x15 each; no AROM noted in dorsiflexors, however palpable contraction observed. Standing balance on foam wedge for gastroc/soleus stretch, reduced UE support from BUE>no UE with time and tactile cues for hip extension, anterior weight shift. Gait x18' with platform RW and min/modA +2 for management of RW and facilitation of weight shifting; posterior LOB when RW moved too far in front of pt and poor foot clearance to take next step; totalA +2 to safely sit in w/c. Second gait trial x50' with improved weight shifting, foot clearance, righting reactions. Remained seated in w/c at end of session, all needs in reach.   Tx 2: Pt received seated in w/c, denies pain and agreeable to treatment. BLE shoes and AFOs donned totalA. Gait with  platform RW RUE and min/modA +2 x80'. Pt demonstrates 4-5 steps reciprocally with symmetrical rate before stopping to "reset" after mild posterior LOB. Increased clonus with fatigue towards end of gait trial. Three trials of three muskateers gait for facilitation of increased gait speed and increased confidence, however when pt feels increased facilitation for anterior/lateral weight shift he demonstrates freezing and increased rigidity into partial squat position, unable to correct with maxA +2 and multimodal verbal/tactile cues. Standing balance with platform RW with alternating toe taps to 3" step for focus on weight shift, foot clearance, stance control for carryover into gait. MaxA +2 initially for stance control and facilitating weight shift to L side, improved to minA with repetition. Returned to room totalA in w/c. Remained seated, wife present and all needs in reach.      Therapy Documentation Precautions:  Precautions Precautions: Fall, Cervical Precaution Comments: no brace required Required Braces or Orthoses: Other Brace/Splint Other Brace/Splint: pt has RUE resting hand splint at night & dynamic hand splint for during the day Restrictions Weight Bearing Restrictions: No   See Function Navigator for Current Functional Status.   Therapy/Group: Individual Therapy  Vista Lawmanlizabeth J Tygielski 06/29/2016, 9:50 AM

## 2016-06-29 NOTE — Progress Notes (Signed)
[facility] PHYSICAL MEDICINE & REHABILITATION     PROGRESS NOTE  Subjective/Complaints:  No complaints. Awaiting more therapy today. Sleeping well. No pain.   ROS: pt denies nausea, vomiting, diarrhea, cough, shortness of breath or chest pain   Objective: Vital Signs: Blood pressure 115/70, pulse 62, temperature 97.6 F (36.4 C), temperature source Axillary, resp. rate 18, height 6\' 1"  (1.854 m), weight 113.2 kg (249 lb 9.6 oz), SpO2 96 %. No results found.  Recent Labs  06/28/16 0452  WBC 9.0  HGB 14.5  HCT 41.2  PLT 266    Recent Labs  06/28/16 0452  NA 137  K 4.6  CL 103  GLUCOSE 105*  BUN 16  CREATININE 0.88  CALCIUM 8.8*   CBG (last 3)  No results for input(s): GLUCAP in the last 72 hours.  Wt Readings from Last 3 Encounters:  06/28/16 113.2 kg (249 lb 9.6 oz)  06/21/16 116.9 kg (257 lb 11.5 oz)  03/09/16 116.1 kg (256 lb)    Physical Exam:  BP 115/70 (BP Location: Right Arm)   Pulse 62   Temp 97.6 F (36.4 C) (Axillary)   Resp 18   Ht 6\' 1"  (1.854 m)   Wt 113.2 kg (249 lb 9.6 oz)   SpO2 96%   BMI 32.93 kg/m  Constitutional: He appears well-developed and well-nourished.  HENT: Normocephalic and atraumatic. Bruises on face/around eyes Eyes: EOMI. No discharge.  Cardiovascular: RRR without murmur. No JVD  Respiratory: CTA Bilaterally without wheezes or rales. Normal effort GI: Soft. Bowel sounds are normal.  Musculoskeletal: He exhibits edema. He exhibits no tenderness. Tight heel cords on right.   Neurological: He is alert and oriented.  Dysarthric speech Motor: RUE: 1-2/5 prox to distal. Wearing glove/resting splint  LUE: Shoulder abduction, elbow flexion/extension 4/5, wrist, hand grip 2+/5, contractures of digits 3 and 4.  B/l LE 4-/5 prox to distal.  Resting tone in ue 1/4 and 1/4 LE's Skin: Skin is warm and dry.  Psychiatric: Normal mood and behavior.  Cognition and memory are not impaired  Assessment/Plan: 1. Functional deficits  secondary to cervical myelopathy which require 3+ hours per day of interdisciplinary therapy in a comprehensive inpatient rehab setting. Physiatrist is providing close team supervision and 24 hour management of active medical problems listed below. Physiatrist and rehab team continue to assess barriers to discharge/monitor patient progress toward functional and medical goals.  Function:  Bathing Bathing position Bathing activity did not occur: Refused Position: Systems developer parts bathed by patient: Abdomen Body parts bathed by helper: Right arm, Left arm, Chest, Front perineal area, Buttocks, Right upper leg, Left upper leg, Right lower leg, Left lower leg, Back  Bathing assist Assist Level: 2 helpers      Upper Body Dressing/Undressing Upper body dressing Upper body dressing/undressing activity did not occur: Refused What is the patient wearing?: Pull over shirt/dress     Pull over shirt/dress - Perfomed by patient: Thread/unthread right sleeve, Thread/unthread left sleeve Pull over shirt/dress - Perfomed by helper: Put head through opening, Pull shirt over trunk        Upper body assist Assist Level: Touching or steadying assistance(Pt > 75%)      Lower Body Dressing/Undressing Lower body dressing   What is the patient wearing?: Shoes, Pants, Socks       Pants- Performed by helper: Thread/unthread right pants leg, Pull pants up/down, Thread/unthread left pants leg       Socks - Performed by helper: Don/doff right  sock, Don/doff left sock   Shoes - Performed by helper: Don/doff right shoe, Don/doff left shoe          Lower body assist Assist for lower body dressing: 2 Helpers      Toileting Toileting Toileting activity did not occur: Safety/medical concerns   Toileting steps completed by helper: Adjust clothing prior to toileting, Performs perineal hygiene, Adjust clothing after toileting    Toileting assist Assist level: Two helpers    Transfers Chair/bed transfer   Chair/bed transfer method: Stand pivot Chair/bed transfer assist level: Maximal assist (Pt 25 - 49%/lift and lower) Chair/bed transfer assistive device: Other Mechanical lift: Stedy   Locomotion Ambulation Ambulation activity did not occur: Safety/medical concerns   Max distance: 76 Assist level: 2 helpers   Education administratorWheelchair Wheelchair activity did not occur: Safety/medical concerns Type: Manual Max wheelchair distance: 30 Assist Level: Touching or steadying assistance (Pt > 75%)  Cognition Comprehension Comprehension assist level: Follows complex conversation/direction with no assist  Expression Expression assist level: Expresses basic 90% of the time/requires cueing < 10% of the time.  Social Interaction Social Interaction assist level: Interacts appropriately with others with medication or extra time (anti-anxiety, antidepressant).  Problem Solving Problem solving assist level: Solves complex problems: With extra time  Memory Memory assist level: Complete Independence: No helper    Medical Problem List and Plan: 1.  Deficits in mobility as well as inability to carry out ADL tasks secondary to cervical myelopathy, hx of ACDF in February 2018.  Cont CIR therapies  May use bone stimulator  -I suspect that dysarthria/speech changes are related to his falls and mild head trauma/post-concussion symptoms 2.  DVT Prophylaxis/Anticoagulation: Pharmaceutical: Lovenox 3. Pain Management: tylenol prn.  4. Mood: LCSW to follow for evaluation and support. Team to provide ego support.  5. Neuropsych: This patient is capable of making decisions on his own behalf. 6. Skin/Wound Care: Monitor daily. Routine pressure relief measures 7. Fluids/Electrolytes/Nutrition: Monitor I/O.              BMP WNL 6/9 8. HTN: Monitor BP bid. Continue Norvasc.  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  May need 20mg  however  -will hold on PRAFo's at present             Cont bracing/ROM efforts otherwise 10. Vitamin B 12 deficiency: on supplement.  11. H/O of  Anxiety: Monitor for now--does not want to start any meds. Trazodone at nights prn to help manage anxiety at nights.   Stable at present 12. Hypoalbuminemia             Supplement initiated 6/9  LOS (Days) 6 A FACE TO FACE EVALUATION WAS PERFORMED  Kelon Easom T 06/29/2016 9:46 AM

## 2016-06-29 NOTE — Progress Notes (Signed)
Speech Language Pathology Daily Session Note  Patient Details  Name: Steve Koch MRN: [id number] Date of Birth: [birth year]  Today's Date: 06/29/2016 SLP Individual Time: [id number] SLP Individual Time Calculation (min): 60 min  Short Term Goals: Week 1: SLP Short Term Goal 1 (Week 1): Pt will consume regular textures and thin liquids with mod I use of swallowing precautions and minimal overt s/s of aspiration over 3 consecutive sessions.  SLP Short Term Goal 2 (Week 1): Pt will achieve intelligibilty at the conversational level with supervision verbal cues for increased vocal intensity and overarticulation.   Skilled Therapeutic Interventions: Skilled treatment session focused on dysphagia and speech communication goals. SLP facilitated session by providing skilled observation of pt consuming regular texture snack (peanut butter with graham crackers) and thin water via straw. Pt was free of overt s/s of aspiration. Pt also states that he is no longer experiencing any choking sensations when eating. Pt required Min A to supervision cues to achieve speech intelligibility at ~ 80% at the simple conversation level. Pt able to recall speech intelligibility strategies with Mod I. Pt was returned to room, left upright in wheelchair with all needs within reach. Continue per current plan of care.      Function:  Eating Eating   Modified Consistency Diet: No Eating Assist Level: Set up assist for   Eating Set Up Assist For: Opening containers Helper Scoops Food on Utensil: Occasionally     Cognition Comprehension Comprehension assist level: Follows complex conversation/direction with no assist  Expression   Expression assist level: Expresses basic 90% of the time/requires cueing < 10% of the time.  Social Interaction Social Interaction assist level: Interacts appropriately with others with medication or extra time (anti-anxiety, antidepressant).  Problem Solving Problem solving assist level:  Solves complex problems: With extra time  Memory Memory assist level: Complete Independence: No helper    Pain    Therapy/Group: Individual Therapy  Aydeen Blume 06/29/2016, 1:44 PM

## 2016-06-30 ENCOUNTER — Inpatient Hospital Stay (HOSPITAL_COMMUNITY): Payer: Self-pay

## 2016-06-30 ENCOUNTER — Inpatient Hospital Stay (HOSPITAL_COMMUNITY): Payer: Self-pay | Admitting: Physical Therapy

## 2016-06-30 ENCOUNTER — Inpatient Hospital Stay (HOSPITAL_COMMUNITY): Payer: BLUE CROSS/BLUE SHIELD | Admitting: Speech Pathology

## 2016-06-30 MED ORDER — CYANOCOBALAMIN 1000 MCG/ML IJ SOLN
1000.0000 ug | INTRAMUSCULAR | Status: DC
  Administered 2016-07-06 – 2016-07-13 (×2): 1000 ug via INTRAMUSCULAR
  Filled 2016-06-30 (×3): qty 1

## 2016-06-30 MED ORDER — BACLOFEN 20 MG PO TABS
20.0000 mg | ORAL_TABLET | Freq: Three times a day (TID) | ORAL | Status: DC
  Administered 2016-06-30 – 2016-07-05 (×15): 20 mg via ORAL
  Filled 2016-06-30 (×15): qty 1

## 2016-06-30 NOTE — Progress Notes (Signed)
Speech Language Pathology Weekly Progress and Session Note  Patient Details  Name: Steve Koch MRN: [id number] Date of Birth: Jan 30, 1960  Beginning of progress report period: June 23, 2016 End of progress report period: June 30, 2016  Today's Date: 06/30/2016 SLP Individual Time: 1345-1430 SLP Individual Time Calculation (min): 45 min  Short Term Goals: Week 1: SLP Short Term Goal 1 (Week 1): Pt will consume regular textures and thin liquids with mod I use of swallowing precautions and minimal overt s/s of aspiration over 3 consecutive sessions.  SLP Short Term Goal 1 - Progress (Week 1): Met SLP Short Term Goal 2 (Week 1): Pt will achieve intelligibilty at the conversational level with supervision verbal cues for increased vocal intensity and overarticulation.  SLP Short Term Goal 2 - Progress (Week 1): Met    New Short Term Goals: Week 2: SLP Short Term Goal 1 (Week 2): Pt will recall speech intelligibility strategies with Mod I cues.  SLP Short Term Goal 2 (Week 2): Pt will achieve intelligibilty at the conversational level with supervision verbal cues for increased vocal intensity and overarticulation in a noisey environment.   Weekly Progress Updates:     Intensity: Minumum of 1-2 x/day, 30 to 90 minutes Frequency: 3 to 5 out of 7 days Duration/Length of Stay: 14-21 days Treatment/Interventions: Cueing hierarchy;Functional tasks;Patient/family education;Speech/Language facilitation   Daily Session  Skilled Therapeutic Interventions: Skilled treatment session focused on speech intelligibility goals. SLP facilitated session by providing supervision cues to achieve speech intelligibility to ~ 75% at the simple conversation level in mildly noisey environment. Pt returned to room, left upright in wheelchair with all needs within reach. Continue per current plan of care.      Function:    Cognition Comprehension Comprehension assist level: Follows complex  conversation/direction with no assist  Expression   Expression assist level: Expresses basic needs/ideas: With no assist  Social Interaction    Problem Solving Problem solving assist level: Solves complex problems: With extra time  Memory Memory assist level: Complete Independence: No helper   General    Pain    Therapy/Group: Individual Therapy  Marice Angelino 06/30/2016, 4:19 PM

## 2016-06-30 NOTE — Progress Notes (Signed)
Physical Therapy Weekly Progress Note  Patient Details  Name: Steve Koch MRN: [id number] Date of Birth: [birth year]  Beginning of progress report period: June 24, 2016 End of progress report period: June 30, 2016  Today's Date: 06/30/2016 PT Individual Time: 0800-0900 and 1430-1530 PT Individual Time Calculation (min): 60 min and 60 min (total 120 min)   Patient has met 3 of 3 short term goals.  Pt currently requires maxA for bed mobility, maxA +1 sit >stand and stand pivot transfers with RW RUE platform, mod/maxA +2 for gait with R platform RW. Pt with significant ROM, ankle/hip strategy, strength, and balance confidence impairments in standing that are suspected to be mostly chronic in nature, in addition to spasticity, hypertonicity and clonus worsened with fatigue that impair standing balance and gait training. Pt is however demonstrate steady progress towards goals. Pt is motivated and hard working in all therapy sessions. Pt's wife has been present for a portion of several sessions to observe however has not participated in any hands on training that will be required prior to d/c.   Patient continues to demonstrate the following deficits muscle weakness, muscle joint tightness and muscle paralysis, decreased cardiorespiratoy endurance, impaired timing and sequencing, abnormal tone, unbalanced muscle activation, decreased coordination and decreased motor planning and decreased sitting balance, decreased standing balance, decreased postural control and decreased balance strategies and therefore will continue to benefit from skilled PT intervention to increase functional independence with mobility.  Patient progressing toward long term goals..  Continue plan of care.  PT Short Term Goals Week 1:  PT Short Term Goal 1 (Week 1): Pt will complete sit<>stand transfer with max assist +1. PT Short Term Goal 1 - Progress (Week 1): Met PT Short Term Goal 2 (Week 1): Pt will complete bed<>chair  with max assist +1.  PT Short Term Goal 2 - Progress (Week 1): Met PT Short Term Goal 3 (Week 1): Pt will initiate gait training.  PT Short Term Goal 3 - Progress (Week 1): Met Week 2:  PT Short Term Goal 1 (Week 2): Pt will demonstrate bed mobility consistent modA PT Short Term Goal 2 (Week 2): Pt will demonstrate sit <>stand modA +1 PT Short Term Goal 3 (Week 2): Pt will demonstrate gait x50' with consistent modA +1 PT Short Term Goal 4 (Week 2): Pt will demonstrate dynamic standing balance with minA x5 min  Skilled Therapeutic Interventions/Progress Updates: Tx 1: Pt received supine in bed, denies pain and agreeable to treatment. Supine>sit with HOB flat and no bedrails maxA. Sit <>stand x5 reps from bed with LUE over therapist's shoulder, variable mod>max overall, occasional failed trials d/t poor anterior weight shift. Stand pivot transfer bed>w/c with modA for balance, +2 to manage RW and max verbal/tactile cues for sequencing and technique. Lateral scoot transfer w/c >bed with transfer board and modA with NDT facilitatory techniques to increase anterior weight shift. Standing balance with RW and mod/maxA +2 while performing alternating toe taps to 3" step, requiring several attempts each before successfully getting foot onto step d/t poor weight shift. PROM to B hamstrings in sitting. Standing hip flexor stretch; poor form and difficulty maintaining position in standing. Plan to teach wife methods for performing PROM with pt. Mirror visual feedback utilized to improve postural control. Gait x30' with platform RW and modA +2 for balance and RW management; mirror again utilized for visual feedback and postural awareness. Remained seated in w/c at end of session, all needs in reach.   Tx 2: Pt received  seated in w/c, denies pain and agreeable to treatment. Demonstrated to wife how to stretch BLE hamstrings, gastroc/soleus in sitting with LE propped on chair, assist needed for anterior weight  shift/pelvic tilt. Gait x80' with platform RW and +2A for facilitation of weight shift and management of RW. Two additional gait trials performed without shoes to A with managing clonus; facilitation at R knee for knee control to prevent recurvatum, approximation to reduce clonus. Anterior pelvic tilt cueing applied during final gait trail with noted reduced truncal sway, improved foot clearance, increased step length and gait speed. Returned to room totalA; remained seated with all needs in reach at completion of session.      Therapy Documentation Precautions:  Precautions Precautions: Fall, Cervical Precaution Comments: no brace required Required Braces or Orthoses: Other Brace/Splint Other Brace/Splint: pt has RUE resting hand splint at night & dynamic hand splint for during the day Restrictions Weight Bearing Restrictions: No Pain: Pain Assessment Pain Score: 0-No pain   See Function Navigator for Current Functional Status.  Therapy/Group: Individual Therapy  Luberta Mutter 06/30/2016, 11:49 AM

## 2016-06-30 NOTE — Progress Notes (Signed)
Occupational Therapy Weekly Progress Note  Patient Details  Name: Steve Koch MRN: [id number] Date of Birth: 03-28-1960  Beginning of progress report period: June 24, 2016 End of progress report period: June 30, 2016  Patient has met 0 of 3 short term goals.  Pt progress with BADLs has been minimal this past week.  Pt continues to demonstrate significant BUE shoulder weakness/limited AROM and significantly limited BUE/hand FMC.  Pt currently requires max A for sit<>stand with Stedy and and min A for standing balance in [place] for LB bathing/dressing tasks.  Pt requires min/max verbal cues for sequencing with sit<>stand and max verbal cues for anterior weight shift during task.  Pt requires max A for bathing tasks and LB dressing tasks.  Pt is able to thread BUE partially into sleeves of shirt but requires assistance with pulling over head and trunk. Pt's wife has not been present for OT sessions. Patient continues to demonstrate the following deficits: muscle weakness (generalized) and decreased sensation, coordination disorder and therefore will continue to benefit from skilled OT intervention to enhance overall performance with BADL and Reduce care partner burden.  Patient not progressing toward long term goals.  See goal revision..  Plan of care revisions: Goals modified to overall mod A. Have d/c LB dressing goal as pt will require total A.  OT Short Term Goals Week 1:  OT Short Term Goal 1 (Week 1): Pt will perform toilet transfer with max A in order to decrease level of assistance needed with transfer. OT Short Term Goal 1 - Progress (Week 1): Progressing toward goal OT Short Term Goal 2 (Week 1): Pt will perform LB clothing management with mod A for standing balance.  OT Short Term Goal 2 - Progress (Week 1): Revised due to lack of progress OT Short Term Goal 3 (Week 1): Pt will perform LB dressing with mod A. OT Short Term Goal 3 - Progress (Week 1): Revised due to lack of  progress Week 2:  OT Short Term Goal 1 (Week 2): Pt will perform toilet transfer with max A in order to decrease level of assistance needed with transfer. OT Short Term Goal 2 (Week 2): Pt will perform LB dressing with max A OT Short Term Goal 3 (Week 2): Pt will perform LB clothing management with max A OT Short Term Goal 4 (Week 2): Pt will perform bathing tasks with mod A  Therapy Documentation Precautions:  Precautions Precautions: Fall, Cervical Precaution Comments: no brace required Required Braces or Orthoses: Other Brace/Splint Other Brace/Splint: pt has RUE resting hand splint at night & dynamic hand splint for during the day Restrictions Weight Bearing Restrictions: No  See Function Navigator for Current Functional Status.    Leotis Shames [facility] 06/30/2016, 6:42 AM

## 2016-06-30 NOTE — Progress Notes (Signed)
[place] PHYSICAL MEDICINE & REHABILITATION     PROGRESS NOTE  Subjective/Complaints:  Feeling well. No new issues. Going out to therapy with PT. Tone unchanged  ROS: pt denies nausea, vomiting, diarrhea, cough, shortness of breath or chest pain    Objective: Vital Signs: Blood pressure 107/68, pulse 63, temperature 98.5 F (36.9 C), temperature source Oral, resp. rate 14, height 6\' 1"  (1.854 m), weight 113.2 kg (249 lb 9.6 oz), SpO2 95 %. No results found.  Recent Labs  06/28/16 0452  WBC 9.0  HGB 14.5  HCT 41.2  PLT 266    Recent Labs  06/28/16 0452  NA 137  K 4.6  CL 103  GLUCOSE 105*  BUN 16  CREATININE 0.88  CALCIUM 8.8*   CBG (last 3)  No results for input(s): GLUCAP in the last 72 hours.  Wt Readings from Last 3 Encounters:  06/28/16 113.2 kg (249 lb 9.6 oz)  06/21/16 116.9 kg (257 lb 11.5 oz)  03/09/16 116.1 kg (256 lb)    Physical Exam:  BP 107/68 (BP Location: Right Arm)   Pulse 63   Temp 98.5 F (36.9 C) (Oral)   Resp 14   Ht 6\' 1"  (1.854 m)   Wt 113.2 kg (249 lb 9.6 oz)   SpO2 95%   BMI 32.93 kg/m  Constitutional: He appears well-developed and well-nourished.  HENT: Normocephalic and atraumatic. Bruises on face/around eyes Eyes: EOMI. No discharge.  Cardiovascular: RRR without murmur. No JVD   Respiratory: CTA Bilaterally without wheezes or rales. Normal effort  GI: Soft. Bowel sounds are normal.  Musculoskeletal: He exhibits edema. He exhibits no tenderness.    Neurological: He is alert and oriented.  Dysarthric speech Motor: RUE: 1-2/5 prox to distal. Wearing glove/resting splint  LUE: Shoulder abduction, elbow flexion/extension 4/5, wrist, hand grip 2+/5, contractures of digits 3 and 4.  B/l LE 4-/5 prox to distal.  Resting tone in ue 1/4 and 1+/4 LE's Skin: Skin is warm and dry.  Psychiatric: Normal mood and behavior.  Cognition and memory are not impaired  Assessment/Plan: 1. Functional deficits secondary to cervical  myelopathy which require 3+ hours per day of interdisciplinary therapy in a comprehensive inpatient rehab setting. Physiatrist is providing close team supervision and 24 hour management of active medical problems listed below. Physiatrist and rehab team continue to assess barriers to discharge/monitor patient progress toward functional and medical goals.  Function:  Bathing Bathing position Bathing activity did not occur: Refused Position: Systems developerhower  Bathing parts Body parts bathed by patient: Abdomen Body parts bathed by helper: Right arm, Left arm, Chest, Front perineal area, Buttocks, Right upper leg, Left upper leg, Right lower leg, Left lower leg, Back  Bathing assist Assist Level: 2 helpers      Upper Body Dressing/Undressing Upper body dressing Upper body dressing/undressing activity did not occur: Refused What is the patient wearing?: Pull over shirt/dress     Pull over shirt/dress - Perfomed by patient: Thread/unthread right sleeve, Thread/unthread left sleeve Pull over shirt/dress - Perfomed by helper: Put head through opening, Pull shirt over trunk        Upper body assist Assist Level: Touching or steadying assistance(Pt > 75%)      Lower Body Dressing/Undressing Lower body dressing   What is the patient wearing?: Shoes, Pants, Socks       Pants- Performed by helper: Thread/unthread right pants leg, Pull pants up/down, Thread/unthread left pants leg       Socks - Performed by helper: Don/doff  right sock, Don/doff left sock   Shoes - Performed by helper: Don/doff right shoe, Don/doff left shoe          Lower body assist Assist for lower body dressing: 2 Helpers      Toileting Toileting Toileting activity did not occur: Safety/medical concerns   Toileting steps completed by helper: Adjust clothing prior to toileting, Performs perineal hygiene, Adjust clothing after toileting Toileting Assistive Devices: Other (comment) (stedy)  Toileting assist Assist level:  Two helpers   Transfers Chair/bed transfer   Chair/bed transfer method: Stand pivot Chair/bed transfer assist level: 2 helpers (+2 sit >stand, min/modA pivotal steps) Chair/bed transfer assistive device: Environmental consultant, Other (RUE platform on RW) Mechanical lift: Landscape architect Ambulation activity did not occur: Safety/medical concerns   Max distance: 75 Assist level: 2 helpers   Education administrator activity did not occur: Safety/medical concerns Type: Manual Max wheelchair distance: 30 Assist Level: Touching or steadying assistance (Pt > 75%)  Cognition Comprehension Comprehension assist level: Follows complex conversation/direction with no assist  Expression Expression assist level: Expresses basic 90% of the time/requires cueing < 10% of the time.  Social Interaction Social Interaction assist level: Interacts appropriately with others with medication or extra time (anti-anxiety, antidepressant).  Problem Solving Problem solving assist level: Solves complex problems: With extra time  Memory Memory assist level: Complete Independence: No helper    Medical Problem List and Plan: 1.  Deficits in mobility as well as inability to carry out ADL tasks secondary to cervical myelopathy, hx of ACDF in February 2018.  Cont CIR therapies  May use bone stimulator  -I suspect that dysarthria/speech changes are related to his falls and mild head trauma/post-concussion symptoms 2.  DVT Prophylaxis/Anticoagulation: Pharmaceutical: Lovenox 3. Pain Management: tylenol prn.  4. Mood: LCSW to follow for evaluation and support. Team to provide ego support.  5. Neuropsych: This patient is capable of making decisions on his own behalf. 6. Skin/Wound Care: Monitor daily. Routine pressure relief measures 7. Fluids/Electrolytes/Nutrition: Monitor I/O.              BMP WNL 6/9 8. HTN: Monitor BP bid. Continue Norvasc.  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 Vitamin B 12 deficiency: on supplement.  11. H/O of  Anxiety: Monitor for now--does not want to start any meds. Trazodone at nights prn to help manage anxiety at nights.   Stable at present 12. Hypoalbuminemia             Supplement initiated 6/9  LOS (Days) 7 A FACE TO FACE EVALUATION WAS PERFORMED  Mackenize Delgadillo T 06/30/2016 10:35 AM

## 2016-06-30 NOTE — Progress Notes (Signed)
Occupational Therapy Session Note  Patient Details  Name: Steve Koch MRN: [id number] Date of Birth: [birth year]  Today's Date: 06/30/2016 OT Individual Time: 0900-1000 OT Individual Time Calculation (min): 60 min    Short Term Goals: Week 2:  OT Short Term Goal 1 (Week 2): Pt will perform toilet transfer with max A in order to decrease level of assistance needed with transfer. OT Short Term Goal 2 (Week 2): Pt will perform LB dressing with max A OT Short Term Goal 3 (Week 2): Pt will perform LB clothing management with max A OT Short Term Goal 4 (Week 2): Pt will perform bathing tasks with mod A  Skilled Therapeutic Interventions/Progress Updates:    Focus on sitting balance, sit<>stand, standing balance, and lateral scoots.  Pt practiced sit<>stand from elevated surface with focus on anterior lean and weight shifts to facilitate sit<>stand.  Pt continues to requires max verbal cues for anterior lean and slow methodical actions during sit<>stand.  Pt performed sit<>stand X 6 with assistance ranging from min A to max A.  Pt also practiced lateral weight shifts to facilitate reciprocal scooting on mat.  Pt requires max verbal cues for weight shifts.  Pt returned to w/c and remained in w/c with all needs in place in room.  Therapy Documentation Precautions:  Precautions Precautions: Fall, Cervical Precaution Comments: no brace required Required Braces or Orthoses: Other Brace/Splint Other Brace/Splint: pt has RUE resting hand splint at night & dynamic hand splint for during the day Restrictions Weight Bearing Restrictions: No Pain: Pain Assessment Pain Score: 0-No pain  See Function Navigator for Current Functional Status.   Therapy/Group: Individual Therapy  Steve Koch, Steve Koch 06/30/2016, 10:01 AM

## 2016-07-01 ENCOUNTER — Inpatient Hospital Stay (HOSPITAL_COMMUNITY): Payer: Self-pay

## 2016-07-01 MED ORDER — LORAZEPAM 0.5 MG PO TABS
0.5000 mg | ORAL_TABLET | Freq: Two times a day (BID) | ORAL | Status: AC | PRN
  Administered 2016-07-01: 0.5 mg via ORAL
  Filled 2016-07-01: qty 1

## 2016-07-01 NOTE — Progress Notes (Signed)
Occupational Therapy Session Note  Patient Details  Name: Steve Koch MRN: [id number] Date of Birth: 1960-08-21  Today's Date: 07/01/2016 OT Individual Time: 1300-1325 OT Individual Time Calculation (min): 25 min    Short Term Goals: Week 2:  OT Short Term Goal 1 (Week 2): Pt will perform toilet transfer with max A in order to decrease level of assistance needed with transfer. OT Short Term Goal 2 (Week 2): Pt will perform LB dressing with max A OT Short Term Goal 3 (Week 2): Pt will perform LB clothing management with max A OT Short Term Goal 4 (Week 2): Pt will perform bathing tasks with mod A  Skilled Therapeutic Interventions/Progress Updates:    1:1. Pt supine>sitting EOB with MOD A for LE management and trunk facilitation. Pt sit to stand in stedy with MAX A of one with VC for trunk flexion and hip extension in standing. Pt transfers to w/c with total A in stedy. Pt able to maintain sitting balance in stedy without CGA. Pt sits in w/c to [facility] to reach acros midline and obtain toothbrush and toothpaste. Pt brushes teeth with supervision. Exited session with pt seated in w/c with call light in reach and RN in room.  Therapy Documentation Precautions:  Precautions Precautions: Fall, Cervical Precaution Comments: no brace required Required Braces or Orthoses: Other Brace/Splint Other Brace/Splint: pt has RUE resting hand splint at night & dynamic hand splint for during the day Restrictions Weight Bearing Restrictions: No  See Function Navigator for Current Functional Status.   Therapy/Group: Individual Therapy  Shon HaleStephanie M Darleny Sem 07/01/2016, 3:49 PM

## 2016-07-01 NOTE — Plan of Care (Signed)
Problem: SCI BLADDER ELIMINATION Goal: RH STG MANAGE BLADDER WITH ASSISTANCE STG Manage Bladder With min Assistance   Outcome: Not Progressing Needing assistance to place urinal

## 2016-07-01 NOTE — Progress Notes (Signed)
Pt called out to go to restroom, when nurse entered room he c/o of being hot and said that was having some anxiety. Completed toileting  Activity with increased anxiety. Called MD and new medication updated and administered to pt.

## 2016-07-01 NOTE — Progress Notes (Signed)
[place] PHYSICAL MEDICINE & REHABILITATION     PROGRESS NOTE  Subjective/Complaints:  No complaints. Getting into WC for PT Adequate appetite  Objective: Vital Signs: Blood pressure 116/72, pulse (!) 59, temperature 98 F (36.7 C), temperature source Oral, resp. rate 17, height 6\' 1"  (1.854 m), weight 249 lb 9.6 oz (113.2 kg), SpO2 98 %.  Physical Exam:  BP 116/72 (BP Location: Right Arm)   Pulse (!) 59   Temp 98 F (36.7 C) (Oral)   Resp 17   Ht 6\' 1"  (1.854 m)   Wt 249 lb 9.6 oz (113.2 kg)   SpO2 98%   BMI 32.93 kg/m  Ecchymosis around eye nad Chest- cta cv- reg rate abd- soft, nt, +BS Neuro- alert and interactive  Assessment/Plan: 1. Functional deficits secondary to cervical myelopathy   Medical Problem List and Plan: 1.  Deficits in mobility as well as inability to carry out ADL tasks secondary to cervical myelopathy, hx of ACDF in February 2018. Continue CIR 2.  DVT Prophylaxis/Anticoagulation: Pharmaceutical: Lovenox 3. Pain Management: tylenol prn.  4. Mood: LCSW to follow for evaluation and support. Team to provide ego support.  5. Neuropsych: This patient is capable of making decisions on his own behalf. 6. Skin/Wound Care: Monitor daily. Routine pressure relief measures 7. Fluids/Electrolytes/Nutrition: Monitor I/O.    Basic Metabolic Panel:    Component Value Date/Time   NA 137 06/28/2016 0452   K 4.6 06/28/2016 0452   CL 103 06/28/2016 0452   CO2 25 06/28/2016 0452   BUN 16 06/28/2016 0452   CREATININE 0.88 06/28/2016 0452   GLUCOSE 105 (H) 06/28/2016 0452   CALCIUM 8.8 (L) 06/28/2016 0452   8. HTN: Monitor BP bid. Continue Norvasc. 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 Vitamin B 12 deficiency: continue supplement 11. H/O of  Anxiety: Monitor for now--does not want to start any meds. Trazodone at nights prn to help manage anxiety at nights.   Stable at present 12. Hypoalbuminemia  Supplement initiated 6/9  LOS (Days) 8 A FACE TO FACE EVALUATION WAS PERFORMED  Jock Mahon H Lavren Lewan 07/01/2016 9:31 AM

## 2016-07-01 NOTE — Plan of Care (Signed)
Problem: SCI BLADDER ELIMINATION Goal: RH STG MANAGE BLADDER WITH ASSISTANCE STG Manage Bladder With min Assistance   Outcome: Not Progressing Pt unable to manage urinal on his own. Requires assistance Goal: RH STG MANAGE BLADDER WITH EQUIPMENT WITH ASSISTANCE STG Manage Bladder With Equipment With min Assistance   Outcome: Not Progressing Mod assist with urinal

## 2016-07-02 ENCOUNTER — Inpatient Hospital Stay (HOSPITAL_COMMUNITY): Payer: Self-pay | Admitting: Occupational Therapy

## 2016-07-02 NOTE — Plan of Care (Signed)
Problem: SCI BOWEL ELIMINATION Goal: RH STG MANAGE BOWEL WITH ASSISTANCE STG Manage Bowel with min Assistance.   Outcome: Not Progressing No BM > 3 days

## 2016-07-02 NOTE — Progress Notes (Signed)
Occupational Therapy Session Note  Patient Details  Name: Steve Koch MRN: [id number] Date of Birth: [birth year]  Today's Date: 07/02/2016 OT Individual Time: [id number] OT Individual Time Calculation (min): 45 min    Short Term Goals: Week 2:  OT Short Term Goal 1 (Week 2): Pt will perform toilet transfer with max A in order to decrease level of assistance needed with transfer. OT Short Term Goal 2 (Week 2): Pt will perform LB dressing with max A OT Short Term Goal 3 (Week 2): Pt will perform LB clothing management with max A OT Short Term Goal 4 (Week 2): Pt will perform bathing tasks with mod A  Skilled Therapeutic Interventions/Progress Updates:    Pt seen for OT session focusing on sit <> stand in prep for functional tasks. Pt in supine upon arrival, agreeable to tx session and denying pain. TED hose donned total A in supine and he came sitting to EOB with max A and verbal and tactile cuing for sequencing/ technique. [place] stood in [place] with mod A from elevated bed height and transferred to w/c. He declined bathing/grooming tasks this morning, stating his wife would assist later and pt desiring to work on standing.  In gym, addressed reciprocal scooting in w/c in order to come to edge of chair. Facilitation for anterior weight shift in order to adjust hips. Required +2 assist to stand into STEDY from w/c level. Pt completed x2 sets of 5 sit>stand from STEDY seat. Initially requiring mod A due to posterior lean, however, progressed to supervision. Pt with increased success when standing without assist of UE and then placing hands on bar for support. Koban placed around bar of STEDY to assist with grasping. Attempted stand from standard mat height, however, pt cont to have difficulty with anterior shift needed to stand. Educated regarding "rocking" motion and using momentum to assist. Able to stand from elevated mat table back into STEDY. Pt returned to room at end of session, left seated in  w/c with all needs in reach.  Pt cont to be very motivated during tx sessions. Strong desire to return home as independently as possible.   Therapy Documentation Precautions:  Precautions Precautions: Fall, Cervical Precaution Comments: no brace required Required Braces or Orthoses: Other Brace/Splint Other Brace/Splint: pt has RUE resting hand splint at night & dynamic hand splint for during the day Restrictions Weight Bearing Restrictions: No Pain:   No/ denies pain  See Function Navigator for Current Functional Status.   Therapy/Group: Individual Therapy  Lewis, Andree Golphin C 07/02/2016, 6:46 AM

## 2016-07-02 NOTE — Progress Notes (Signed)
[place] PHYSICAL MEDICINE & REHABILITATION     PROGRESS NOTE  Subjective/Complaints:  Feels well no complaints. Encouraged with therapies.  Anxiety yesteray- prn lorazepam  Physical Exam:  BP 118/75 (BP Location: Right Arm)   Pulse 65   Temp 97.9 F (36.6 C) (Oral)   Resp 16   Ht 6\' 1"  (1.854 m)   Wt 249 lb 4.8 oz (113.1 kg)   SpO2 99%   BMI 32.89 kg/m  Ecchymosis around eye nad Chest- cta cv- reg rate abd- soft, nt, +BS Neuro- alert and interactive. Weak upper and lower extremities  Assessment/Plan: 1. Functional deficits secondary to cervical myelopathy   Medical Problem List and Plan: 1.  Deficits in mobility as well as inability to carry out ADL tasks secondary to cervical myelopathy, hx of ACDF in February 2018. Continue CIR 2.  DVT Prophylaxis/Anticoagulation: Pharmaceutical: Lovenox 3. Pain Management: tylenol prn.  4. Mood: LCSW to follow for evaluation and support. Team to provide ego support.  5. Neuropsych: This patient is capable of making decisions on his own behalf. 6. Skin/Wound Care: Monitor daily. Routine pressure relief measures 7. Fluids/Electrolytes/Nutrition: Monitor I/O.    Basic Metabolic Panel:    Component Value Date/Time   NA 137 06/28/2016 0452   K 4.6 06/28/2016 0452   CL 103 06/28/2016 0452   CO2 25 06/28/2016 0452   BUN 16 06/28/2016 0452   CREATININE 0.88 06/28/2016 0452   GLUCOSE 105 (H) 06/28/2016 0452   CALCIUM 8.8 (L) 06/28/2016 0452   8. HTN: Monitor BP bid. Continue Norvasc. adequatley controlled 115/53 9. Spastic quadriparesis:continue baclofe 10. Vitamin B 12 deficiency: continue supplement 11. H/O of  Anxiety: Monitor for now--does not want to start any meds. Trazodone at nights prn to help manage anxiety at nights.  Added lorazepam yesterday for anxiety attack 12. Hypoalbuminemia             Supplement initiated 6/9  LOS (Days) 9 A FACE TO FACE EVALUATION WAS PERFORMED  Shamus Desantis H Matheson Vandehei 07/02/2016 9:29 AM

## 2016-07-02 NOTE — Plan of Care (Signed)
Problem: SCI BOWEL ELIMINATION Goal: RH STG MANAGE BOWEL WITH ASSISTANCE STG Manage Bowel with min Assistance.   Outcome: Not Progressing No BM > 3 days.   Problem: SCI BLADDER ELIMINATION Goal: RH STG MANAGE BLADDER WITH ASSISTANCE STG Manage Bladder With min Assistance   Outcome: Not Progressing Total assist with urinal

## 2016-07-03 ENCOUNTER — Inpatient Hospital Stay (HOSPITAL_COMMUNITY): Payer: BLUE CROSS/BLUE SHIELD

## 2016-07-03 ENCOUNTER — Inpatient Hospital Stay (HOSPITAL_COMMUNITY): Payer: BLUE CROSS/BLUE SHIELD | Admitting: Speech Pathology

## 2016-07-03 ENCOUNTER — Inpatient Hospital Stay (HOSPITAL_COMMUNITY): Payer: Self-pay

## 2016-07-03 NOTE — Progress Notes (Signed)
Physical Therapy Session Note  Patient Details  Name: Steve Koch MRN: [id number] Date of Birth: [birth year]  Today's Date: 07/03/2016 PT Individual Time: 1300-1410 PT Individual Time Calculation (min): 70 min   Short Term Goals: Week 2:  PT Short Term Goal 1 (Week 2): Pt will demonstrate bed mobility consistent modA PT Short Term Goal 2 (Week 2): Pt will demonstrate sit <>stand modA +1 PT Short Term Goal 3 (Week 2): Pt will demonstrate gait x50' with consistent modA +1 PT Short Term Goal 4 (Week 2): Pt will demonstrate dynamic standing balance with minA x5 min  Skilled Therapeutic Interventions/Progress Updates: Pt presented in w/c agreeable to therapy. Transported to rehab gym. SB transfer to mat with maxA. Performed sit to/from stand from varying heights 22in x 3, 21in x 3,  19.5in x 3 requiring +1 assist from higher levels and +2 assist from 19.5in. Pt required frequent multimodal cues for increased anterior weight shift as pt would push up and posteriorly during transfer. Attempted gait training after transfers however x 52f with pt demonstrating increased R clonus, decreased R knee extension, and increased cues and facilitation for wt shifting. Trialed wt shifting, alternating toe taps to fatigue (x 5-6) with pt requiring modA for facilitation for wt shift. Pt able to clear 2in step for toe taps. Pt returned to room at end of session and remained in w/c with call bell within reach and needs met.      Therapy Documentation Precautions:  Precautions Precautions: Fall, Cervical Precaution Comments: no brace required Required Braces or Orthoses: Other Brace/Splint Other Brace/Splint: pt has RUE resting hand splint at night & dynamic hand splint for during the day Restrictions Weight Bearing Restrictions: No   See Function Navigator for Current Functional Status.   Therapy/Group: Individual Therapy  Steve Koch  Steve Koch, PTA  07/03/2016, 2:15 PM

## 2016-07-03 NOTE — Progress Notes (Signed)
[place] PHYSICAL MEDICINE & REHABILITATION     PROGRESS NOTE  Subjective/Complaints:  Had a good weekend. No new problems. Speech about the same  ROS: pt denies nausea, vomiting, diarrhea, cough, shortness of breath or chest pain     Objective: Vital Signs: Blood pressure 109/65, pulse (!) 57, temperature 98.3 F (36.8 C), temperature source Oral, resp. rate 14, height 6\' 1"  (1.854 m), weight 114 kg (251 lb 6.4 oz), SpO2 96 %. No results found. No results for input(s): WBC, HGB, HCT, PLT in the last 72 hours. No results for input(s): NA, K, CL, GLUCOSE, BUN, CREATININE, CALCIUM in the last 72 hours.  Invalid input(s): CO CBG (last 3)  No results for input(s): GLUCAP in the last 72 hours.  Wt Readings from Last 3 Encounters:  07/03/16 114 kg (251 lb 6.4 oz)  06/21/16 116.9 kg (257 lb 11.5 oz)  03/09/16 116.1 kg (256 lb)    Physical Exam:  BP 109/65 (BP Location: Right Arm)   Pulse (!) 57   Temp 98.3 F (36.8 C) (Oral)   Resp 14   Ht 6\' 1"  (1.854 m)   Wt 114 kg (251 lb 6.4 oz)   SpO2 96%   BMI 33.17 kg/m  Constitutional: He appears well-developed and well-nourished.  HENT: Normocephalic and atraumatic. Bruises on face/around eyes Eyes: EOMI. No discharge.  Cardiovascular: RRR without murmur. No JVD    Respiratory: CTA Bilaterally without wheezes or rales. Normal effort   GI: Soft. Bowel sounds are normal.  Musculoskeletal: He exhibits edema. He exhibits no tenderness.    Neurological: He is alert and oriented.  Dysarthric speech Motor: RUE: 1-2/5 prox to distal.  ---no changes LUE: Shoulder abduction, elbow flexion/extension 4/5, wrist, hand grip 2+/5, contractures of digits 3 and 4.  B/l LE 4-/5 prox to distal.  Resting tone in ue 1/4 and 1+/4 LE's Skin: Skin is warm and dry.  Psychiatric: Normal mood and behavior.  Cognition and memory are not impaired  Assessment/Plan: 1. Functional deficits secondary to cervical myelopathy which require 3+ hours per day  of interdisciplinary therapy in a comprehensive inpatient rehab setting. Physiatrist is providing close team supervision and 24 hour management of active medical problems listed below. Physiatrist and rehab team continue to assess barriers to discharge/monitor patient progress toward functional and medical goals.  Function:  Bathing Bathing position Bathing activity did not occur: Refused Position: Systems developerhower  Bathing parts Body parts bathed by patient: Abdomen Body parts bathed by helper: Right arm, Left arm, Chest, Front perineal area, Buttocks, Right upper leg, Left upper leg, Right lower leg, Left lower leg, Back  Bathing assist Assist Level: 2 helpers      Upper Body Dressing/Undressing Upper body dressing Upper body dressing/undressing activity did not occur: Refused What is the patient wearing?: Pull over shirt/dress     Pull over shirt/dress - Perfomed by patient: Thread/unthread right sleeve, Thread/unthread left sleeve Pull over shirt/dress - Perfomed by helper: Put head through opening, Pull shirt over trunk        Upper body assist Assist Level: Touching or steadying assistance(Pt > 75%)      Lower Body Dressing/Undressing Lower body dressing   What is the patient wearing?: Shoes, Pants, Socks       Pants- Performed by helper: Thread/unthread right pants leg, Pull pants up/down, Thread/unthread left pants leg       Socks - Performed by helper: Don/doff right sock, Don/doff left sock   Shoes - Performed by helper: Don/doff right  shoe, Don/doff left shoe          Lower body assist Assist for lower body dressing: 2 Helpers      Toileting Toileting Toileting activity did not occur:  (using urinal, no BM)   Toileting steps completed by helper: Adjust clothing prior to toileting, Performs perineal hygiene, Adjust clothing after toileting Toileting Assistive Devices: Other (comment) (stedy)  Toileting assist Assist level: Two helpers   Transfers Chair/bed  transfer   Chair/bed transfer method: Stand pivot Chair/bed transfer assist level: 2 helpers Chair/bed transfer assistive device: Environmental consultant, Other (R platform) Mechanical lift: Magazine features editor activity did not occur: Safety/medical concerns   Max distance: 30 Assist level: 2 helpers   Education administrator activity did not occur: Safety/medical concerns Type: Manual Max wheelchair distance: 30 Assist Level: Touching or steadying assistance (Pt > 75%)  Cognition Comprehension Comprehension assist level: Follows complex conversation/direction with no assist  Expression Expression assist level: Expresses basic 90% of the time/requires cueing < 10% of the time.  Social Interaction Social Interaction assist level: Interacts appropriately with others with medication or extra time (anti-anxiety, antidepressant).  Problem Solving Problem solving assist level: Solves complex problems: With extra time  Memory Memory assist level: Complete Independence: No helper    Medical Problem List and Plan: 1.  Deficits in mobility as well as inability to carry out ADL tasks secondary to cervical myelopathy, hx of ACDF in February 2018.  Cont CIR therapies   -I suspect that dysarthria/speech changes are related to his falls and mild head trauma/post-concussion symptoms   -will need to continue working on articulation with SLP/HEP 2.  DVT Prophylaxis/Anticoagulation: Pharmaceutical: Lovenox 3. Pain Management: tylenol prn.  4. Mood: LCSW to follow for evaluation and support. Team to provide ego support.  5. Neuropsych: This patient is capable of making decisions on his own behalf. 6. Skin/Wound Care: Monitor daily. Routine pressure relief measures 7. Fluids/Electrolytes/Nutrition: Monitor I/O.              BMP WNL 6/9--recheck this week 8. HTN: Monitor BP bid. Continue Norvasc.  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 Vitamin B 12 deficiency: on supplement.  11. H/O of  Anxiety: Monitor for now--does not want to start any meds. Trazodone at nights prn to help manage anxiety at nights.   Stable. Pt very engaged with team 12. Hypoalbuminemia             Supplement initiated 6/9  LOS (Days) 10 A FACE TO FACE EVALUATION WAS PERFORMED  Steve Koch T 07/03/2016 9:03 AM

## 2016-07-03 NOTE — Plan of Care (Signed)
Problem: RH Bathing Goal: LTG Patient will bathe with assist, cues/equipment (OT) LTG: Patient will bathe specified number of body parts with assist with/without cues using equipment (position)  (OT)  Goal downgraded due to pt progress. Napoleon Form, OTR/L 6/18  Problem: RH Dressing Goal: LTG Patient will perform lower body dressing w/assist (OT) LTG: Patient will perform lower body dressing with assist, with/without cues in positioning using equipment (OT)  Outcome: Not Applicable Date Met: 50/56/78 Goal d/c as pt will require total A for LB dressing.  Problem: RH Toileting Goal: LTG Patient will perform toileting w/assist, cues/equip (OT) LTG: Patient will perform toiletiing (clothes management/hygiene) with assist, with/without cues using equipment (OT)  Goal downgraded due to pt progress. Napoleon Form, OTR/L 6/18  Problem: RH Toilet Transfers Goal: LTG Patient will perform toilet transfers w/assist (OT) LTG: Patient will perform toilet transfers with assist, with/without cues using equipment (OT)  Goal downgraded due to pt progress. Napoleon Form, OTR/L 6/18

## 2016-07-03 NOTE — Progress Notes (Signed)
Speech Language Pathology Daily Session Note  Patient Details  Name: Steve Koch MRN: [id number] Date of Birth: 1960-12-12  Today's Date: 07/03/2016 SLP Individual Time: 0730-0830 SLP Individual Time Calculation (min): 60 min  Short Term Goals: Week 2: SLP Short Term Goal 1 (Week 2): Pt will recall speech intelligibility strategies with Mod I cues.  SLP Short Term Goal 2 (Week 2): Pt will achieve intelligibilty at the conversational level with supervision verbal cues for increased vocal intensity and overarticulation in a noisey environment.   Skilled Therapeutic Interventions: Skilled treatment session focused on communication goals. SLP facilitated session by providing Mod I for use of speech intelligibility strategies in highly distracting noisy environment. Pt able to achieve >80% intelligibility. Pt is Mod I with self-monitoring and repetition of words/phrases when he feels that he isn't intelligible. Pt with good insight into factors that impacts intelligibility such as fatigue, increased distractions etc. Pt was returned to room, left upright in wheelchair and all needs within reach. Continue per current plan of care.      Function:    Cognition Comprehension Comprehension assist level: Follows complex conversation/direction with no assist  Expression   Expression assist level: Expresses complex 90% of the time/cues < 10% of the time  Social Interaction Social Interaction assist level: Interacts appropriately with others with medication or extra time (anti-anxiety, antidepressant).  Problem Solving Problem solving assist level: Solves complex problems: With extra time  Memory Memory assist level: Complete Independence: No helper    Pain Pain Assessment Pain Assessment: No/denies pain  Therapy/Group: Individual Therapy   Ingri Diemer B. Dreama Saaverton, M.S., CCC-SLP Speech-Language Pathologist   Shamell Hittle 07/03/2016, 11:58 AM

## 2016-07-03 NOTE — Plan of Care (Signed)
Problem: SCI BOWEL ELIMINATION Goal: RH STG MANAGE BOWEL WITH ASSISTANCE STG Manage Bowel with min Assistance.   Outcome: Not Progressing Last BM 6/13. Issue discussed with patient.

## 2016-07-03 NOTE — Progress Notes (Signed)
Occupational Therapy Session Note  Patient Details  Name: Steve ShadCharles Hoppel MRN: [id number] Date of Birth: [birth year]  Today's Date: 07/03/2016 OT Individual Time: 0900-1000 OT Individual Time Calculation (min): 60 min    Short Term Goals: Week 2:  OT Short Term Goal 1 (Week 2): Pt will perform toilet transfer with max A in order to decrease level of assistance needed with transfer. OT Short Term Goal 2 (Week 2): Pt will perform LB dressing with max A OT Short Term Goal 3 (Week 2): Pt will perform LB clothing management with max A OT Short Term Goal 4 (Week 2): Pt will perform bathing tasks with mod A  Skilled Therapeutic Interventions/Progress Updates:    Pt resting in w/c upon arrival.  Pt elected to forego a shower this morning to focus on sit<>stand and static standing balance.  Pt transitioned to gym and transferred to mat [facility](Stedy) with max A for sit>stand.  Focus on sit<>stand from elevated mat with emphasis on body postioning, BLE positioning, and leaning anteriorly to facilitate sit<>stand.  Pt continues to require max verbal cues for anterior lean and pt continues to exhibit posterior lean when initiating task.  Pt also engaged in semi-squats from elevated surface.  Pt returned to room and engaged in UB bathing/dressing tasks requiring max A to complete tasks.  Pt remained in w/c with all needs within reach.   Therapy Documentation Precautions:  Precautions Precautions: Fall, Cervical Precaution Comments: no brace required Required Braces or Orthoses: Other Brace/Splint Other Brace/Splint: pt has RUE resting hand splint at night & dynamic hand splint for during the day Restrictions Weight Bearing Restrictions: No Pain: Pain Assessment Pain Assessment: No/denies pain See Function Navigator for Current Functional Status.   Therapy/Group: Individual Therapy  [name], Amit Meloy Chappell 07/03/2016, 10:05 AM

## 2016-07-03 NOTE — Plan of Care (Signed)
After speaking with pt's primary OTA, goals have been downgraded to overall mod A. See POC for goal details. Johnsie CancelAmy Lewis, OTR/L

## 2016-07-04 ENCOUNTER — Inpatient Hospital Stay (HOSPITAL_COMMUNITY): Payer: Self-pay | Admitting: Physical Therapy

## 2016-07-04 ENCOUNTER — Inpatient Hospital Stay (HOSPITAL_COMMUNITY): Payer: Self-pay | Admitting: Speech Pathology

## 2016-07-04 ENCOUNTER — Inpatient Hospital Stay (HOSPITAL_COMMUNITY): Payer: Self-pay

## 2016-07-04 NOTE — Progress Notes (Signed)
Recreational Therapy Session Note  Patient Details  Name: Steve Koch MRN: [id number] Date of Birth: 11/20/1960 Today's Date: 07/04/2016  Pain: no c/o Skilled Therapeutic Interventions/Progress Updates: Session focused on activity tolerance, stand pivot transfer using PFRW, dynamic standing balance, weight shifting, ambulation using PFRW, stretching in prone on the therapy mat & discussion of community reintegration during co-treat with PT.  Pts wife present & observing session  Therapy/Group: Co-Treatment  Steve Koch 07/04/2016, 4:19 PM

## 2016-07-04 NOTE — Progress Notes (Signed)
Occupational Therapy Session Note  Patient Details  Name: Steve Koch MRN: [id number] Date of Birth: 1960/12/11  Today's Date: 07/04/2016 OT Individual Time: 0900-1000 OT Individual Time Calculation (min): 60 min    Short Term Goals: Week 2:  OT Short Term Goal 1 (Week 2): Pt will perform toilet transfer with max A in order to decrease level of assistance needed with transfer. OT Short Term Goal 2 (Week 2): Pt will perform LB dressing with max A OT Short Term Goal 3 (Week 2): Pt will perform LB clothing management with max A OT Short Term Goal 4 (Week 2): Pt will perform bathing tasks with mod A  Skilled Therapeutic Interventions/Progress Updates:    Pt engaged in BADL retraining including bathing at shower level and dressing with sit<>stand from w/c [name](Stedy).  Pt able to bathe RUE, abdomen, and B upper legs but requires assistance for thoroughness.  Pt requires max A + 2 from w/c and min A from seated position on Stedy.  Pt assisted with doffing/donning shirt but required increased assistance this morning.  Focus on sit<>stand and increased BUE use for functional tasks.    Therapy Documentation Precautions:  Precautions Precautions: Fall, Cervical Precaution Comments: no brace required Required Braces or Orthoses: Other Brace/Splint Other Brace/Splint: pt has RUE resting hand splint at night & dynamic hand splint for during the day Restrictions Weight Bearing Restrictions: No Pain:  Pt denied pain  See Function Navigator for Current Functional Status.   Therapy/Group: Individual Therapy  [name], [name] 07/04/2016, 10:06 AM

## 2016-07-04 NOTE — Progress Notes (Signed)
Physical Therapy Session Note  Patient Details  Name: Steve Koch MRN: [id number] Date of Birth: [birth year]  Today's Date: 07/04/2016 PT Individual Time: 1015-1045 PT Individual Time Calculation (min): 30 min   Short Term Goals: Week 2:  PT Short Term Goal 1 (Week 2): Pt will demonstrate bed mobility consistent modA PT Short Term Goal 2 (Week 2): Pt will demonstrate sit <>stand modA +1 PT Short Term Goal 3 (Week 2): Pt will demonstrate gait x50' with consistent modA +1 PT Short Term Goal 4 (Week 2): Pt will demonstrate dynamic standing balance with minA x5 min  Skilled Therapeutic Interventions/Progress Updates:    no c/o pain.  Session focus on forward weight shift and sit<>stand.    Pt performs stand/pivot with platform RW to and from therapy mat with max assist to facilitate weight shifting for pivot.  Repeated sit<>stand (8 trials) focus on forward reach for horse shoe and maintaining forward weight shift during stand, as well as forward weight shift and eccentric control to sit.  Pt returned to room at end of session and positioned upright in w/c, call bell in reach, needs met.   Therapy Documentation Precautions:  Precautions Precautions: Fall, Cervical Precaution Comments: no brace required Required Braces or Orthoses: Other Brace/Splint Other Brace/Splint: pt has RUE resting hand splint at night & dynamic hand splint for during the day Restrictions Weight Bearing Restrictions: No   See Function Navigator for Current Functional Status.   Therapy/Group: Individual Therapy  Earnest Conroy Penven-Crew 07/04/2016, 12:11 PM

## 2016-07-04 NOTE — Progress Notes (Signed)
Speech Language Pathology Daily Session Note  Patient Details  Name: Steve Koch MRN: [id number] Date of Birth: 02-Jan-1961  Today's Date: 07/04/2016 SLP Individual Time: 1101-1158 SLP Individual Time Calculation (min): 57 min  Short Term Goals: Week 2: SLP Short Term Goal 1 (Week 2): Pt will recall speech intelligibility strategies with Mod I cues.  SLP Short Term Goal 2 (Week 2): Pt will achieve intelligibilty at the conversational level with supervision verbal cues for increased vocal intensity and overarticulation in a noisey environment.   Skilled Therapeutic Interventions:  Pt was seen for skilled ST targeting speech intelligibility goals.  Pt recalled speech intelligibility strategies for 100% accuracy and was able to utilize them in a verbal reasoning task with intermittent supervision verbal cues to achieve intelligibility in a moderately distracting environment.  Pt was returned to room at the end of today's therapy session and was left in the care of RN.  Continue per current plan of care.       Function:  Eating Eating                 Cognition Comprehension Comprehension assist level: Follows complex conversation/direction with no assist  Expression   Expression assist level: Expresses complex 90% of the time/cues < 10% of the time  Social Interaction Social Interaction assist level: Interacts appropriately with others with medication or extra time (anti-anxiety, antidepressant).  Problem Solving Problem solving assist level: Solves complex problems: With extra time  Memory Memory assist level: Complete Independence: No helper    Pain Pain Assessment Pain Assessment: No/denies pain  Therapy/Group: Individual Therapy  Keshauna Degraffenreid, Melanee SpryNicole L 07/04/2016, 12:15 PM

## 2016-07-04 NOTE — Progress Notes (Signed)
[place] PHYSICAL MEDICINE & REHABILITATION     PROGRESS NOTE  Subjective/Complaints:  Had a good night. Ready for more therapy today  ROS: pt denies nausea, vomiting, diarrhea, cough, shortness of breath or chest pain      Objective: Vital Signs: Blood pressure 118/64, pulse 65, temperature 98.1 F (36.7 C), temperature source Oral, resp. rate 16, height 6\' 1"  (1.854 m), weight 114 kg (251 lb 6.4 oz), SpO2 94 %. No results found. No results for input(s): WBC, HGB, HCT, PLT in the last 72 hours. No results for input(s): NA, K, CL, GLUCOSE, BUN, CREATININE, CALCIUM in the last 72 hours.  Invalid input(s): CO CBG (last 3)  No results for input(s): GLUCAP in the last 72 hours.  Wt Readings from Last 3 Encounters:  07/03/16 114 kg (251 lb 6.4 oz)  06/21/16 116.9 kg (257 lb 11.5 oz)  03/09/16 116.1 kg (256 lb)    Physical Exam:  BP 118/64 (BP Location: Right Arm)   Pulse 65   Temp 98.1 F (36.7 C) (Oral)   Resp 16   Ht 6\' 1"  (1.854 m)   Wt 114 kg (251 lb 6.4 oz)   SpO2 94%   BMI 33.17 kg/m  Constitutional: He appears well-developed and well-nourished.  HENT: Normocephalic and atraumatic. Bruises on face/around eyes Eyes: EOMI. No discharge.  Cardiovascular: RRR without murmur. No JVD    Respiratory: CTA Bilaterally without wheezes or rales. Normal effort   GI: Soft. Bowel sounds are normal.  Musculoskeletal: He exhibits edema. He exhibits no tenderness.    Neurological: He is alert and oriented.  Dysarthric speech Motor: RUE: 1-2/5 prox to distal.  ---no changes LUE: Shoulder abduction, elbow flexion/extension 4/5, wrist, hand grip 2+/5, contractures of digits 3 and 4.  B/l LE 4-/5 prox to distal.  Resting tone in ue 1/4 and 1+/4 LE's Skin: Skin is warm and dry.  Psychiatric: Normal mood and behavior.  Cognition and memory are not impaired  Assessment/Plan: 1. Functional deficits secondary to cervical myelopathy which require 3+ hours per day of  interdisciplinary therapy in a comprehensive inpatient rehab setting. Physiatrist is providing close team supervision and 24 hour management of active medical problems listed below. Physiatrist and rehab team continue to assess barriers to discharge/monitor patient progress toward functional and medical goals.  Function:  Bathing Bathing position Bathing activity did not occur: Refused Position: Systems developer parts bathed by patient: Abdomen Body parts bathed by helper: Right arm, Left arm, Chest, Front perineal area, Buttocks, Right upper leg, Left upper leg, Right lower leg, Left lower leg, Back  Bathing assist Assist Level: 2 helpers      Upper Body Dressing/Undressing Upper body dressing Upper body dressing/undressing activity did not occur: Refused What is the patient wearing?: Pull over shirt/dress     Pull over shirt/dress - Perfomed by patient: Thread/unthread right sleeve, Thread/unthread left sleeve Pull over shirt/dress - Perfomed by helper: Put head through opening, Pull shirt over trunk        Upper body assist Assist Level: Touching or steadying assistance(Pt > 75%)      Lower Body Dressing/Undressing Lower body dressing   What is the patient wearing?: Shoes, Pants, Socks       Pants- Performed by helper: Thread/unthread right pants leg, Pull pants up/down, Thread/unthread left pants leg       Socks - Performed by helper: Don/doff right sock, Don/doff left sock   Shoes - Performed by helper: Don/doff right shoe, Don/doff left  shoe          Lower body assist Assist for lower body dressing: 2 Helpers      Toileting Toileting Toileting activity did not occur:  (using urinal, no BM)   Toileting steps completed by helper: Adjust clothing prior to toileting, Performs perineal hygiene, Adjust clothing after toileting Toileting Assistive Devices: Other (comment) (stedy)  Toileting assist Assist level: Two helpers   Transfers Chair/bed transfer    Chair/bed transfer method: Stand pivot Chair/bed transfer assist level: 2 helpers Chair/bed transfer assistive device: Environmental consultantWalker, Other (R platform) Mechanical lift: Magazine features editortedy   Locomotion Ambulation Ambulation activity did not occur: Safety/medical concerns   Max distance: 30 Assist level: 2 helpers   Education administratorWheelchair Wheelchair activity did not occur: Safety/medical concerns Type: Manual Max wheelchair distance: 30 Assist Level: Touching or steadying assistance (Pt > 75%)  Cognition Comprehension Comprehension assist level: Follows complex conversation/direction with no assist  Expression Expression assist level: Expresses complex 90% of the time/cues < 10% of the time  Social Interaction Social Interaction assist level: Interacts appropriately with others with medication or extra time (anti-anxiety, antidepressant).  Problem Solving Problem solving assist level: Solves complex problems: With extra time  Memory Memory assist level: Complete Independence: No helper    Medical Problem List and Plan: 1.  Deficits in mobility as well as inability to carry out ADL tasks secondary to cervical myelopathy, hx of ACDF in February 2018.  Cont CIR therapies   -I suspect that dysarthria/speech changes are related to his falls and mild head trauma/post-concussion symptoms   -  continue working on articulation with SLP/HEP 2.  DVT Prophylaxis/Anticoagulation: Pharmaceutical: Lovenox 3. Pain Management: tylenol prn.  4. Mood: LCSW to follow for evaluation and support. Team to provide ego support.  5. Neuropsych: This patient is capable of making decisions on his own behalf. 6. Skin/Wound Care: Monitor daily. Routine pressure relief measures 7. Fluids/Electrolytes/Nutrition: Monitor I/O.              BMP WNL 6/9--recheck tomorrow 8. HTN: Monitor BP bid. Continue Norvasc.  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 Vitamin B 12 deficiency: on supplement.  11. H/O of  Anxiety: Monitor for now--does not want to start any meds. Trazodone at nights prn to help manage anxiety at nights.   Stable. Pt very engaged with team 12. Hypoalbuminemia             Supplement initiated 6/9  LOS (Days) 11 A FACE TO FACE EVALUATION WAS PERFORMED  SWARTZ,ZACHARY T 07/04/2016 8:53 AM

## 2016-07-04 NOTE — Evaluation (Signed)
Recreational Therapy Assessment and Plan  Patient Details  Name: Steve Koch MRN: [id number] Date of Birth: [birth year] Today's Date: 07/04/2016  Rehab Potential: Good  ELOS: 3 weeks  Assessment  Problem List:      Patient Active Problem List   Diagnosis Date Noted  . Benign essential HTN   . Spastic quadriparesis ([place])   . Vitamin B 12 deficiency   . Generalized anxiety disorder   . Hypoalbuminemia due to protein-calorie malnutrition ([place])   . Atrophy of muscle of both hands   . Fall 06/21/2016  . Cervical myelopathy ([place]) 06/21/2016  . Essential hypertension 06/21/2016  . Sensory disturbance 06/21/2016  . Gait instability 06/21/2016  . Hyperreflexia   . Poor tolerance for ambulation   . Myelopathy ([place]) 03/09/2016    Past Medical History:      Past Medical History:  Diagnosis Date  . Anxiety    situational with injury  . Carpal tunnel syndrome    right hand  . GERD (gastroesophageal reflux disease)   . Hypertension   . PONV (postoperative nausea and vomiting)    Past Surgical History:       Past Surgical History:  Procedure Laterality Date  . ANTERIOR CERVICAL DECOMP/DISCECTOMY FUSION N/A 03/09/2016   Procedure: Anterior Cervical Discectomy Fusion Cervial 5-7;  Surgeon: Melina Schools, MD;  Location: [facility];  Service: Orthopedics;  Laterality: N/A;  Requests for 3.5 hrs  . BACK SURGERY    . COLONOSCOPY    . HAND SURGERY Left   . HERNIA REPAIR Right    inguinal  . KNEE ARTHROSCOPY Right     Assessment & Plan Clinical Impression: Patient is a 56 y.o. year old male with history of GERD, fall at work with RUE>LUE weakness due to 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 History taken from chart review and patient. He underwent ACDF C5-C7 02/2016 and has had progressive weakness BLE. MRI 6/1 done revealing stable ACDF with persistent abutment to right ventral cord due to osteophytes C5/6, no definite cord  signal abnormality and T2-T3 left focal paracentral disc protrusion. He has had difficulty walking and sustained a fall and struck his head and was admitted on [facility] 06/20/16 for work up. CT head negative. Mild dysarthria of questionable etiology. Neurology recommended EMG/NCS for work up--done recently and results unavailable. Vitamin B 12 levels--228 and supplement added. HIV- NR. CK and TSH levels WNL. Acetylcholine AB pending. Intensive therapy recommended due to cervical myelopathy with deficits in mobility as well as inability to carry out ADL tasks. CIR consulted and patient felt to be a good candidate. Patient transferred to CIR on 06/23/2016 .    Pt presents with decreased activity tolerance, decreased functional mobility, decreased balance, decreased functional use of BUE's, decreased coordination, dysarthria Limiting pt's independence with leisure/community pursuits. Plan Min 1 TR group/session >20 minutes during LOS  Recommendations for other services: Neuropsych  Discharge Criteria: Patient will be discharged from TR if patient refuses treatment 3 consecutive times without medical reason.  If treatment goals not met, if there is a change in medical status, if patient makes no progress towards goals or if patient is discharged from hospital.  The above assessment, treatment plan, treatment alternatives and goals were discussed and mutually agreed upon: by patient  [place] 07/04/2016, 4:17 PM

## 2016-07-04 NOTE — Progress Notes (Signed)
Physical Therapy Session Note  Patient Details  Name: Steve Koch MRN: [id number] Date of Birth: 04/24/1960  Today's Date: 07/04/2016 PT Individual Time: [id number] PT Individual Time Calculation (min): 75 min   Short Term Goals: Week 2:  PT Short Term Goal 1 (Week 2): Pt will demonstrate bed mobility consistent modA PT Short Term Goal 2 (Week 2): Pt will demonstrate sit <>stand modA +1 PT Short Term Goal 3 (Week 2): Pt will demonstrate gait x50' with consistent modA +1 PT Short Term Goal 4 (Week 2): Pt will demonstrate dynamic standing balance with minA x5 min  Skilled Therapeutic Interventions/Progress Updates: Pt received seated in w/c, denies pain and agreeable to treatment. Stand pivot transfer maxA with platform RW. Standing balance alternating weight shifts while kicking ball with min guard. Gait x50' with modA +2 for managing RW, facilitation of anterior pelvic tilt to improve weight shifting, stance control and foot clearance. Sit <>supine maxA with log roll. Supine <>prone totalA +2. Prone lying x15 min total while performing PROM to hip flexors, rectus femoris, soleus. Propped on two pillows under chest to increase lumbar extension, hip flexor stretch. While in prone, discussed with rec therapist possibility of going on community outing next week, goals, etc. Gait x50' with turns with modA +2; improved initiation into standing, gait speed and foot clearance. Remained seated in w/c at end of session, all needs in reach.      Therapy Documentation Precautions:  Precautions Precautions: Fall, Cervical Precaution Comments: no brace required Required Braces or Orthoses: Other Brace/Splint Other Brace/Splint: pt has RUE resting hand splint at night & dynamic hand splint for during the day Restrictions Weight Bearing Restrictions: No Pain: Pain Assessment Pain Assessment: No/denies pain  See Function Navigator for Current Functional Status.   Therapy/Group: Individual  Therapy  Vista Lawmanlizabeth J Tygielski 07/04/2016, 4:01 PM

## 2016-07-05 ENCOUNTER — Inpatient Hospital Stay (HOSPITAL_COMMUNITY): Payer: BLUE CROSS/BLUE SHIELD | Admitting: *Deleted

## 2016-07-05 ENCOUNTER — Inpatient Hospital Stay (HOSPITAL_COMMUNITY): Payer: Self-pay | Admitting: Physical Therapy

## 2016-07-05 ENCOUNTER — Inpatient Hospital Stay (HOSPITAL_COMMUNITY): Payer: Self-pay | Admitting: Occupational Therapy

## 2016-07-05 ENCOUNTER — Inpatient Hospital Stay (HOSPITAL_COMMUNITY): Payer: Self-pay | Admitting: Speech Pathology

## 2016-07-05 LAB — BASIC METABOLIC PANEL
Anion gap: 7 (ref 5–15)
BUN: 15 mg/dL (ref 6–20)
CHLORIDE: 105 mmol/L (ref 101–111)
CO2: 29 mmol/L (ref 22–32)
CREATININE: 0.92 mg/dL (ref 0.61–1.24)
Calcium: 9.3 mg/dL (ref 8.9–10.3)
GFR calc Af Amer: >60 mL/min (ref >60)
GFR calc non Af Amer: >60 mL/min (ref >60)
Glucose, Bld: 102 mg/dL — ABNORMAL HIGH (ref 65–99)
Potassium: 4.7 mmol/L (ref 3.5–5.1)
SODIUM: 141 mmol/L (ref 135–145)

## 2016-07-05 LAB — CBC
HCT: 43.3 % (ref 39.0–52.0)
Hemoglobin: 15 g/dL (ref 13.0–17.0)
MCH: 30.1 pg (ref 26.0–34.0)
MCHC: 34.6 g/dL (ref 30.0–36.0)
MCV: 86.9 fL (ref 78.0–100.0)
PLATELETS: 282 10*3/uL (ref 150–400)
RBC: 4.98 MIL/uL (ref 4.22–5.81)
RDW: 14.6 % (ref 11.5–15.5)
WBC: 9.8 10*3/uL (ref 4.0–10.5)

## 2016-07-05 MED ORDER — BACLOFEN 20 MG PO TABS
20.0000 mg | ORAL_TABLET | Freq: Four times a day (QID) | ORAL | Status: DC
  Administered 2016-07-05 – 2016-07-08 (×12): 20 mg via ORAL
  Filled 2016-07-05 (×12): qty 1

## 2016-07-05 NOTE — Progress Notes (Signed)
Social Work Patient ID: Steve Koch, male   DOB: [birth year], 56 y.o.   MRN: [id number]   Met with pt and wife today to review team conference.  Both aware we continue to aim toward a 6/30 d/c date and are agreeable.  Pleased with progress and aware that focus remains on transfers and sit to stand strength. They have been considering a possible lift chair in the home but plan to discuss further with therapies.  Will continue to follow for d/c planning and support.  Josie Mesa, LCSW

## 2016-07-05 NOTE — Progress Notes (Signed)
Physical Therapy Session Note  Patient Details  Name: Steve Koch MRN: [id number] Date of Birth: September 12, 1960  Today's Date: 07/05/2016 PT Individual Time: 0800-0900 PT Individual Time Calculation (min): 60 min   Short Term Goals: Week 2:  PT Short Term Goal 1 (Week 2): Pt will demonstrate bed mobility consistent modA PT Short Term Goal 2 (Week 2): Pt will demonstrate sit <>stand modA +1 PT Short Term Goal 3 (Week 2): Pt will demonstrate gait x50' with consistent modA +1 PT Short Term Goal 4 (Week 2): Pt will demonstrate dynamic standing balance with minA x5 min  Skilled Therapeutic Interventions/Progress Updates:  Pt received supine in bed. Max A to roll R and max A +2 for R sidelying>sit. Pt required min A and more than usual amount of time to scoot to EOB. Sit>stand mod A +1 from slightly elevated bed with verbal and tactile cues for forward lean and multiple attempts before successful sit>stand. Stand pivot from bed>w/c using RW and max A+1 to coordinate turning, facilitate weight shift, and assist with management of RW. Pt total A to hallway outside of rehab gym. Pt ambulated 50' x2 trials with mod A +2 with facilitation anterior pelvic tilt and assistance managing RW during ambulation; first trial pt used platform only for R UE; second trial pt used platform B UEs. No significant difference in gait pattern or amount of assistance required during ambulation with 1 platform vs B platforms. Pt requires occasional cuing and manual facilitation for upright posture. Backward walking x 6910ft with +2A to facilitate anterior lean during gait; noted shortened step length likely due to lack of gastroc/solues flexibility. Pt returned to pt's room total A where he remained seated in w/c with all needs in reach.   Therapy Documentation Precautions:  Precautions Precautions: Fall, Cervical Precaution Comments: no brace required Required Braces or Orthoses: Other Brace/Splint Other Brace/Splint: pt  has RUE resting hand splint at night & dynamic hand splint for during the day Restrictions Weight Bearing Restrictions: No   See Function Navigator for Current Functional Status.   Therapy/Group: Individual Therapy  Steve Koch 07/05/2016, 12:14 PM

## 2016-07-05 NOTE — Progress Notes (Signed)
Orthopedic Tech Progress Note Patient Details:  Andres ShadCharles Babino 1960/06/20 [id number]  Patient ID: Andres Shadharles Reeb, male   DOB: 1960/06/20, 56 y.o.   MRN: [id number]   Nikki DomCrawford, Alica Shellhammer 07/05/2016, 9:51 AM Called in advanced brace order; spoke with Trey PaulaJeff

## 2016-07-05 NOTE — Progress Notes (Signed)
Occupational Therapy Session Note  Patient Details  Name: Steve Koch MRN: [id number] Date of Birth: 1960/11/19  Today's Date: 07/05/2016 OT Individual Time: 1000-1055 OT Individual Time Calculation (min): 55 min    Short Term Goals: Week 2:  OT Short Term Goal 1 (Week 2): Pt will perform toilet transfer with max A in order to decrease level of assistance needed with transfer. OT Short Term Goal 2 (Week 2): Pt will perform LB dressing with max A OT Short Term Goal 3 (Week 2): Pt will perform LB clothing management with max A OT Short Term Goal 4 (Week 2): Pt will perform bathing tasks with mod A  Skilled Therapeutic Interventions/Progress Updates:    Pt resting in w/c upon arrival with wife present.  Discussed focus on OT sessions and rationale.  Pt and wife agreed that functional transfers were most important tasks to be accomplished.  Pt transitioned to gym and transferred to mat with Stedy.  Focus on sit<>stand without BUE use and emphasis on anterior lean during sit<>stand to restrict tendency to lean posteriorly when performing task.  Pt required proprioceptive input at shoulders to facilitate anterior lean and min A>max A assist at hips to perform tasks.  Pt required max verbal cues for form and sequencing.  Pt returned to room and remained in w/c with all needs within reach.  Pt's wife had expressed desire to shower pt each afternoon and night.  After discussion with RN, it was agreed that nursing will assist pt to shower with Stedy, pt will remain in Alba[place]edy, and pt's wife will assist pt with bathing tasks.  Pt's wife will notify nursing staff to assist back to room.   Therapy Documentation Precautions:  Precautions Precautions: Fall, Cervical Precaution Comments: no brace required Required Braces or Orthoses: Other Brace/Splint Other Brace/Splint: pt has RUE resting hand splint at night & dynamic hand splint for during the day Restrictions Weight Bearing Restrictions: No    Pain:  Pt denies pain  See Function Navigator for Current Functional Status.   Therapy/Group: Individual Therapy  [name], Nakayla Rorabaugh Chappell 07/05/2016, 12:05 PM

## 2016-07-05 NOTE — Progress Notes (Signed)
Occupational Therapy Session Note  Patient Details  Name: Steve Koch MRN: [id number] Date of Birth: [birth year]  Today's Date: 07/05/2016 OT Individual Time: [id number] OT Individual Time Calculation (min): 34 min    Short Term Goals: Week 2:  OT Short Term Goal 1 (Week 2): Pt will perform toilet transfer with max A in order to decrease level of assistance needed with transfer. OT Short Term Goal 2 (Week 2): Pt will perform LB dressing with max A OT Short Term Goal 3 (Week 2): Pt will perform LB clothing management with max A OT Short Term Goal 4 (Week 2): Pt will perform bathing tasks with mod A  Skilled Therapeutic Interventions/Progress Updates:    Pt received sitting up in w/c ready for OT session. Assisted Pt via w/c to ortho gym. Focus on functional mobility transfers and education on bil UE NMR. Pt completed stand pivot transfers w/c to/from mat table using RW with MaxA +2. Pt demonstrating sit to/from stand at mat table with ModA +2, able to maintain standing approx 1 min with manual facilitation for maintaining standing posture. Educated Pt on using bil UEs during transfer to facilitate wt bearing through UEs and to assist with rise/descent with Pt return demonstrating during transfers. Pt completed towel pushes while seated in w/c using L UE and using bil UEs with L UE guiding R UE. Pt left seated in w/c in room, call bell and needs within reach, wife present.   Therapy Documentation Precautions:  Precautions Precautions: Fall, Cervical Precaution Comments: no brace required Required Braces or Orthoses: Other Brace/Splint Other Brace/Splint: pt has RUE resting hand splint at night & dynamic hand splint for during the day Restrictions Weight Bearing Restrictions: No   Pain: Pain Assessment Pain Assessment: Faces Faces Pain Scale: No hurt     See Function Navigator for Current Functional Status.   Therapy/Group: Individual Therapy  Orlando PennerBreanna L Milt Coye 07/05/2016,  4:54 PM

## 2016-07-05 NOTE — Progress Notes (Signed)
Recreational Therapy Session Note  Patient Details  Name: Steve ShadCharles Stegenga MRN: [id number] Date of Birth: [birth year] Today's Date: 07/05/2016  Pain: no c/o Skilled Therapeutic Interventions/Progress Updates: Session focused on activity tolerance, sit-stands and diaphragmatic breathing techniques during co-treat with OT. Pt required instructional cues for timing of breaths during transitional movements to facilitate less dependence with sit-stands.  Discussion with pt & wife about the importance of breath control with all tasks.  Pt participated in animal assisted therapy seated w/c level with focus on forward leans & BUE use.  Pt used RUE and LUE alternately to pet the therapy dog from head to tail, RUE with min assist and LUE with mod assist to support weight of the arm.  Pt required supervision cues for speech intelligeability throughout session.   Therapy/Group: Co-Treatment   Janeene Sand 07/05/2016, 2:23 PM

## 2016-07-05 NOTE — Progress Notes (Signed)
Orthopedic Tech Progress Note Patient Details:  Andres ShadCharles Vanbrocklin 05-Nov-1960 [id number]  Patient ID: Andres Shadharles Campus, male   DOB: 05-Nov-1960, 56 y.o.   MRN: [id number]   Nikki DomCrawford, Krishawna Stiefel 07/05/2016, 1:39 PM Called in advanced brace order; [facility]

## 2016-07-05 NOTE — Progress Notes (Signed)
Speech Language Pathology Daily Session Note  Patient Details  Name: Steve Koch MRN: [id number] Date of Birth: 1960-08-13  Today's Date: 07/05/2016 SLP Individual Time: 1306-1330 SLP Individual Time Calculation (min): 24 min  Short Term Goals: Week 2: SLP Short Term Goal 1 (Week 2): Pt will recall speech intelligibility strategies with Mod I cues.  SLP Short Term Goal 2 (Week 2): Pt will achieve intelligibilty at the conversational level with supervision verbal cues for increased vocal intensity and overarticulation in a noisey environment.   Skilled Therapeutic Interventions:  Pt was seen for skilled ST targeting communication goals.  Pt's wife was present upon arrival and was updated regarding pt's current progress and plan of care.  Pt was intelligible in a highly distracting environment with supervision verbal cues for repetition to clarify.  Gave pt homework of calling in his breakfast order over the phone as an additional challenge for speech intelligibility.  Will follow up at next available appointment.  Pt was left in wheelchair with family and recreational therapist at bedside.       Function:  Eating Eating                 Cognition Comprehension Comprehension assist level: Follows complex conversation/direction with no assist  Expression   Expression assist level: Expresses complex 90% of the time/cues < 10% of the time  Social Interaction Social Interaction assist level: Interacts appropriately with others with medication or extra time (anti-anxiety, antidepressant).  Problem Solving Problem solving assist level: Solves complex problems: With extra time  Memory Memory assist level: Complete Independence: No helper    Pain Pain Assessment Pain Assessment: No/denies pain  Therapy/Group: Individual Therapy  Luismanuel Corman, Melanee SpryNicole L 07/05/2016, 2:46 PM

## 2016-07-05 NOTE — Patient Care Conference (Signed)
Inpatient RehabilitationTeam Conference and Plan of Care Update Date: 07/04/2016   Time: 2:10 PM    Patient Name: Steve Koch      Medical Record Number: [id number]  Date of Birth: Nov 26, 1960 Sex: Male         Room/Bed: 4W17C/4W17C-01 Payor Info: Payor: BLUE [employer] BLUE [employer]IELD / Plan: BCBS OTHER / Product Type: *No Product type* /    Admitting Diagnosis: Cervical Myelap  Admit Date/Time:  06/23/2016  5:40 PM Admission Comments: No comment available   Primary Diagnosis:  Spastic quadriparesis ([facility]) Principal Problem: Spastic quadriparesis ([facility])  Patient Active Problem List   Diagnosis Date Noted  . Benign essential HTN   . Spastic quadriparesis ([facility])   . Vitamin B 12 deficiency   . Generalized anxiety disorder   . Hypoalbuminemia due to protein-calorie malnutrition ([facility])   . Atrophy of muscle of both hands   . Fall 06/21/2016  . Cervical myelopathy ([facility]) 06/21/2016  . Essential hypertension 06/21/2016  . Sensory disturbance 06/21/2016  . Gait instability 06/21/2016  . Hyperreflexia   . Poor tolerance for ambulation   . Myelopathy ([facility]) 03/09/2016    Expected Discharge Date: Expected Discharge Date: 07/15/16  Team Members Present: Physician leading conference: Dr. Faith Rogue Social Worker Present: Amada Jupiter, LCSW Nurse Present: Kennon Portela, RN PT Present: Alyson Reedy, PT OT Present: Ardis Rowan, COTA;Jennifer Katrinka Blazing, OT SLP Present: Feliberto Gottron, SLP PPS Coordinator present : Tora Duck, RN, CRRN     Current Status/Progress Goal Weekly Team Focus  Medical   myelopathy with improved tone. dysarthria likely related to concussin  improve tone, increaes functional mobility  spastiicty mgt, education regarding symptoms and mgt   Bowel/Bladder   continent of bladder, last BM 6/13 miralax given during day shift. Refused laxative night shift. Pt. wants to address issue 07/04/16.   Remain continent of Bowel and bladder.      Swallow/Nutrition/ Hydration        ADL's   bathing-max A; UB dressing-mod A; LB dressing-tot A; sit<>stand-max A/mod A with Stedy; BUE weakness  bathing-mod A; UB dressing-S; LB dressing-min A; toilet transfers-mod A; toileting-max A; standing balance-min A  sitting balance, sit<>stand, standing balance, BADL retraining, BUE NMR   Mobility   maxA transfers, mod/maxA sit <>stand, modA +2 gait with platform RW  minA overall, short distance gait with LRAD  LE ROM/tone management, dynamic standing balance, gait training   Communication   speech intelligibility at the simple conversation level  intelligible speech at the conversation >90%  carryover of speech intelligibility strategies within distractive and noisy environements   Safety/Cognition/ Behavioral Observations            Pain   denies pain.   pain less than or equal to 2.  Monitor for pain and treat prn   Skin   MSAD to buttocks. Barrier cream applied.  No new skin breakdown while in rehab.  Assess skin q shift and prn.    Rehab Goals Patient on target to meet rehab goals: Yes *See Care Plan and progress notes for long and short-term goals.  Barriers to Discharge: spasticity, ongoing limitations in use of UE's    Possible Resolutions to Barriers:  continue NMR and equipment training    Discharge Planning/Teaching Needs:  Pt to d/c home with wife who can continue to provide 24/7 assistance.  Teaching ongoing with wife.   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  Motor  fxn, spasticity will be a long-term problem/ therapy focus.  Baclofen seems to be working well. Making slow progress and still +2 assist for ambulation; usually mod - max for transfers.  Discussion about best options on w/c.  SW to follow up with Advanced rep.  Some ADL goals downgraded with primary focus to be on sit-stand and transfers.  Still need prolonged time to complete ADLs but extremely motivated. ST addressing  speech intelligibility.    Revisions to Treatment Plan:  Some goals downgraded.   Continued Need for Acute Rehabilitation Level of Care: The patient requires daily medical management by a physician with specialized training in physical medicine and rehabilitation for the following conditions: Daily direction of a multidisciplinary physical rehabilitation program to ensure safe treatment while eliciting the highest outcome that is of practical value to the patient.: Yes Daily medical management of patient stability for increased activity during participation in an intensive rehabilitation regime.: Yes Daily analysis of laboratory values and/or radiology reports with any subsequent need for medication adjustment of medical intervention for : Neurological problems;Post surgical problems  Kyliana Standen 07/05/2016, 8:26 AM

## 2016-07-05 NOTE — Progress Notes (Signed)
Physical Therapy Session Note  Patient Details  Name: Steve Koch MRN: [id number] Date of Birth: [birth year]  Today's Date: 07/05/2016 PT Individual Time: 1330-1400 PT Individual Time Calculation (min): 30 min   Short Term Goals: Week 2:  PT Short Term Goal 1 (Week 2): Pt will demonstrate bed mobility consistent modA PT Short Term Goal 2 (Week 2): Pt will demonstrate sit <>stand modA +1 PT Short Term Goal 3 (Week 2): Pt will demonstrate gait x50' with consistent modA +1 PT Short Term Goal 4 (Week 2): Pt will demonstrate dynamic standing balance with minA x5 min  Skilled Therapeutic Interventions/Progress Updates:    Pt sitting in w/c upon arrival, agreeable to PT session. Focus of session was on sit<>stand transfers. Pt having difficulty with forward weight transition with standing. Modified technique with avoiding cervical extension, slowing sequence to allow increased anterior weight translation. Repeating stand X8 reps with mod assist. Pt up in w/c after session with all needs in reach and spouse present.   Therapy Documentation Precautions:  Precautions Precautions: Fall, Cervical Precaution Comments: no brace required Required Braces or Orthoses: Other Brace/Splint Other Brace/Splint: pt has RUE resting hand splint at night & dynamic hand splint for during the day Restrictions Weight Bearing Restrictions: No Pain: Pain Assessment Pain Assessment: No/denies pain  See Function Navigator for Current Functional Status.   Therapy/Group: Individual Therapy  [name], PT 07/05/2016, 3:54 PM

## 2016-07-05 NOTE — Progress Notes (Signed)
[facility]IC[facility] RE[facility]ILITATION     PROGRESS NOTE  Subjective/Complaints:  Just waking up. No new complaints. Denies pain.   ROS: pt denies nausea, vomiting, diarrhea, cough, shortness of breath or chest pain      Objective: Vital Signs: Blood pressure 110/67, pulse 65, temperature 97.6 F (36.4 C), temperature source Oral, resp. rate 17, height 6\' 1"  (1.854 m), weight 114 kg (251 lb 6.4 oz), SpO2 98 %. No results found.  Recent Labs  07/05/16 0541  WBC 9.8  HGB 15.0  HCT 43.3  PLT 282    Recent Labs  07/05/16 0541  NA 141  K 4.7  CL 105  GLUCOSE 102*  BUN 15  CREATININE 0.92  CALCIUM 9.3   CBG (last 3)  No results for input(s): GLUCAP in the last 72 hours.  Wt Readings from Last 3 Encounters:  07/03/16 114 kg (251 lb 6.4 oz)  06/21/16 116.9 kg (257 lb 11.5 oz)  03/09/16 116.1 kg (256 lb)    Physical Exam:  BP 110/67 (BP Location: Right Arm)   Pulse 65   Temp 97.6 F (36.4 C) (Oral)   Resp 17   Ht 6\' 1"  (1.854 m)   Wt 114 kg (251 lb 6.4 oz)   SpO2 98%   BMI 33.17 kg/m  Constitutional: He appears well-developed and well-nourished.  HENT: Normocephalic and atraumatic. Bruises on face/around eyes Eyes: EOMI. No discharge.  Cardiovascular: RRR without murmur. No JVD  Respiratory: CTA Bilaterally without wheezes or rales. Normal effort    GI: Soft. Bowel sounds are normal.  Musculoskeletal: He exhibits edema RUE. Both heel cords tight. Can't range past -10 degrees at either ankle Neurological: He is alert and oriented.  Dysarthric speech Motor: RUE: 1-2/5 prox to distal.  ---no changes LUE: Shoulder abduction, elbow flexion/extension 4/5, wrist, hand grip 2+/5, contractures of digits 3 and 4.  B/l LE 4-/5 prox to distal.  Resting tone in ue 1/4 and 1+/4 LE's Skin: Skin is warm and dry.  Psychiatric: Normal mood and behavior.  Cognition and memory are not impaired  Assessment/Plan: 1. Functional deficits secondary to cervical  myelopathy which require 3+ hours per day of interdisciplinary therapy in a comprehensive inpatient rehab setting. Physiatrist is providing close team supervision and 24 hour management of active medical problems listed below. Physiatrist and rehab team continue to assess barriers to discharge/monitor patient progress toward functional and medical goals.  Function:  Bathing Bathing position Bathing activity did not occur: Refused Position: Systems developer parts bathed by patient: Abdomen Body parts bathed by helper: Right arm, Left arm, Chest, Front perineal area, Buttocks, Right upper leg, Left upper leg, Right lower leg, Left lower leg, Back  Bathing assist Assist Level: 2 helpers      Upper Body Dressing/Undressing Upper body dressing Upper body dressing/undressing activity did not occur: Refused What is the patient wearing?: Pull over shirt/dress     Pull over shirt/dress - Perfomed by patient: Thread/unthread right sleeve, Thread/unthread left sleeve Pull over shirt/dress - Perfomed by helper: Put head through opening, Pull shirt over trunk        Upper body assist Assist Level: Touching or steadying assistance(Pt > 75%)      Lower Body Dressing/Undressing Lower body dressing   What is the patient wearing?: Shoes, Pants, Socks       Pants- Performed by helper: Thread/unthread right pants leg, Pull pants up/down, Thread/unthread left pants leg       Socks -  Performed by helper: Don/doff right sock, Don/doff left sock   Shoes - Performed by helper: Don/doff right shoe, Don/doff left shoe          Lower body assist Assist for lower body dressing: 2 Helpers      Toileting Toileting Toileting activity did not occur:  (using urinal, no BM)   Toileting steps completed by helper: Adjust clothing prior to toileting, Performs perineal hygiene, Adjust clothing after toileting Toileting Assistive Devices: Other (comment) (stedy)  Toileting assist Assist level:  Two helpers   Transfers Chair/bed transfer   Chair/bed transfer method: Stand pivot Chair/bed transfer assist level: Maximal assist (Pt 25 - 49%/lift and lower) Chair/bed transfer assistive device: Biochemist, clinicalWalker Mechanical lift: Magazine features editortedy   Locomotion Ambulation Ambulation activity did not occur: Safety/medical concerns   Max distance: 50 Assist level: 2 helpers   Education administratorWheelchair Wheelchair activity did not occur: Safety/medical concerns Type: Manual Max wheelchair distance: 30 Assist Level: Touching or steadying assistance (Pt > 75%)  Cognition Comprehension Comprehension assist level: Follows complex conversation/direction with no assist  Expression Expression assist level: Expresses complex 90% of the time/cues < 10% of the time  Social Interaction Social Interaction assist level: Interacts appropriately with others with medication or extra time (anti-anxiety, antidepressant).  Problem Solving Problem solving assist level: Solves complex problems: With extra time  Memory Memory assist level: Complete Independence: No helper    Medical Problem List and Plan: 1.  Deficits in mobility as well as inability to carry out ADL tasks secondary to cervical myelopathy, hx of ACDF in February 2018.  Cont CIR therapies   -I suspect that dysarthria/speech changes are related to his falls and mild head trauma/post-concussion symptoms   -  continue working on articulation with SLP/HEP 2.  DVT Prophylaxis/Anticoagulation: Pharmaceutical: Lovenox 3. Pain Management: tylenol prn.  4. Mood: LCSW to follow for evaluation and support. Team to provide ego support.  5. Neuropsych: This patient is capable of making decisions on his own behalf. 6. Skin/Wound Care: Monitor daily. Routine pressure relief measures 7. Fluids/Electrolytes/Nutrition: reasonable PO intake.              BMP WNL 6/20 8. HTN: Monitor BP bid. Continue Norvasc.  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 Vitamin B 12 deficiency: on supplement.  11. H/O of  Anxiety: Monitor for now--does not want to start any meds. Trazodone at nights prn to help manage anxiety at nights.   Stable. Pt very engaged with team    LOS (Days) 12 A FACE TO FACE EVALUATION WAS PERFORMED  SWARTZ,ZACHARY T 07/05/2016 11:10 AM

## 2016-07-06 ENCOUNTER — Inpatient Hospital Stay (HOSPITAL_COMMUNITY): Payer: Self-pay | Admitting: Physical Therapy

## 2016-07-06 ENCOUNTER — Inpatient Hospital Stay (HOSPITAL_COMMUNITY): Payer: BLUE CROSS/BLUE SHIELD | Admitting: Speech Pathology

## 2016-07-06 ENCOUNTER — Inpatient Hospital Stay (HOSPITAL_COMMUNITY): Payer: BLUE CROSS/BLUE SHIELD | Admitting: *Deleted

## 2016-07-06 NOTE — Progress Notes (Signed)
Recreational Therapy Session Note  Patient Details  Name: Steve Koch MRN: [id number] Date of Birth: 1960-07-31 Today's Date: 07/06/2016  Pain: no c/o Skilled Therapeutic Interventions/Progress Updates: Session focused on activity tolerance, sit-stands, stand turn transfers to to tub bench using PFRW during co-treat with OT.  Adjusted PFRW for better UE placement per pt feedback. [name] 07/06/2016, 9:52 AM

## 2016-07-06 NOTE — Progress Notes (Signed)
Occupational Therapy Session Note  Patient Details  Name: Steve ShadCharles Mullarkey MRN: [id number] Date of Birth: [birth year]  Today's Date: 07/06/2016 OT Individual Time: 0800-0900 OT Individual Time Calculation (min): 60 min    Short Term Goals: Week 2:  OT Short Term Goal 1 (Week 2): Pt will perform toilet transfer with max A in order to decrease level of assistance needed with transfer. OT Short Term Goal 2 (Week 2): Pt will perform LB dressing with max A OT Short Term Goal 3 (Week 2): Pt will perform LB clothing management with max A OT Short Term Goal 4 (Week 2): Pt will perform bathing tasks with mod A  Skilled Therapeutic Interventions/Progress Updates:    Cotreatment with Recreational Therapist.Pt resting in w/c upon arrival. Pt declined bathing/dressing this morning stating that he wanted to "work on standing and walking." Pt initially practiced sit<>stand and transferring to sitting on tub bench.  Pt requires max multimodal cues for forward weight shift with sit<>stand and min/mod A for initial push from seat.  Pt requires min A for stepping and backing up with +2 for assistance with managing PFRW.  Adjustments made to PFRW to facilitate ambulation/transfers.  Pt transitioned to therapy gym and practiced sit<>stand X 4 without RW.  Pt continues to require max multimodal cues for forward weight shift and min A for boost from seat.  Pt remained in w/c in gym awaiting PT session.  Therapy Documentation Precautions:  Precautions Precautions: Fall, Cervical Precaution Comments: no brace required Required Braces or Orthoses: Other Brace/Splint Other Brace/Splint: pt has RUE resting hand splint at night & dynamic hand splint for during the day Restrictions Weight Bearing Restrictions: No Pain:  Pt denies pain  See Function Navigator for Current Functional Status.   Therapy/Group: Individual Therapy  [name], Elica Almas Chappell 07/06/2016, 3:35 PM

## 2016-07-06 NOTE — Progress Notes (Signed)
Physical Therapy Session Note  Patient Details  Name: Steve Koch MRN: [id number] Date of Birth: 1960/03/12  Today's Date: 07/06/2016 PT Individual Time: 0900-1000; 1330-1430 PT Individual Time Calculation (min): 60 min; 60 min   Short Term Goals: Week 2:  PT Short Term Goal 1 (Week 2): Pt will demonstrate bed mobility consistent modA PT Short Term Goal 2 (Week 2): Pt will demonstrate sit <>stand modA +1 PT Short Term Goal 3 (Week 2): Pt will demonstrate gait x50' with consistent modA +1 PT Short Term Goal 4 (Week 2): Pt will demonstrate dynamic standing balance with minA x5 min  Skilled Therapeutic Interventions/Progress Updates:  Tx 1: Pt received seated in w/c in rehab gym with hand off from OT. Pt performed seated anterior leans with S seated in w/c for core activation. Discussed with pt different techniques/strategies for sit>stand and pt requesting to try using L UE to help push. Wedge placed under pt's heels to assist in anterior tibial translation and forward lean during sit>stand. Attempted sit>stand with R UE placed on RW and L LE pushing from w/c but unable to stand completely. Sit>stand x2 with mod A+2 with 1 person facilitating anterior lean at pt's shoulders throughout sit>stand. Wedge moved to under pt's toes to facilitate anterior lean and lengthening of gastroc/soleus; sit>stand x2 using technique stated previously with verbal and tactile cues for anterior pelvic tilt and glute activation. Pt reports difficulty fully extending L knee when standing and feeling unsteady with L knee fully extended. Noted gastroc>soleus tightness on L LE likely contributing to lack of full knee extension when standing; L gastroc stretch x 2 min. Standing frame with wedge under pt's toes, cues for anterior pelvic tilt and glute activation, and intermittent manual facilitation at B knees to promote knee extension for prolonged stretch in standing x10 min. Pt instructed to perform reaching task writing  on board in standing but had difficulty maintaining hip extension during this task. Pt ambulated 65' with RW and mod A+2 with manual facilitation for anterior pelvic tilt and assistance with RW management. Pt left seated in manual w/c in pt's room with all needs in reach.   Tx 2: Pt received seated in manual w/c reporting mild fatigue but agreeable to tx. Pt total A to hallway outside rehab gym. Pt sit>stand with mod A+2 with student therapist cuing across pt's back for anterior lean.Pt ambulated approximately 54ft to apartment with RW and mod A+2 with manual facilitation at pelvis for anterior pelvic tilt and  assistance with RW. Pt prematurely sat on edge of regular bed requiring assistance to scoot back and position himself on EOB. When turning pt tends to lower his COM despite multimodal cuing for hip extension. Sit>supine max A on regular bed to lift LEs. Glute bridges x5 to straighten himself in bed. Mod A rolling R and max A +2 for R sidelying>sit to lift trunk. Stand pivot mod A+2 with RW from regular bed>w/c with verbal and tactile cues to maintain upright posture and assist with management of RW. Backward walking with RW and mod A+2 59ft with manual facilitation for anterior pelvic tilt and verbal and tactile cues to maintain hip extension during backward stepping. Pt attempted sit>stand 1 more trial but unsuccessful due to fatigue. Total A to pt's room where he remained seated in w/c with all needs met.   Therapy Documentation Precautions:  Precautions Precautions: Fall, Cervical Precaution Comments: no brace required Required Braces or Orthoses: Other Brace/Splint Other Brace/Splint: pt has RUE resting hand splint at  night & dynamic hand splint for during the day Restrictions Weight Bearing Restrictions: No   See Function Navigator for Current Functional Status.   Therapy/Group: Individual Therapy  Alysia Penna 07/06/2016, 3:55 PM

## 2016-07-06 NOTE — Progress Notes (Signed)
Speech Language Pathology Daily Session Note  Patient Details  Name: Steve Koch MRN: [id number] Date of Birth: [birth year]  Today's Date: 07/06/2016 SLP Individual Time: 1100-1200 SLP Individual Time Calculation (min): 60 min  Short Term Goals: Week 2: SLP Short Term Goal 1 (Week 2): Pt will recall speech intelligibility strategies with Mod I cues.  SLP Short Term Goal 2 (Week 2): Pt will achieve intelligibilty at the conversational level with supervision verbal cues for increased vocal intensity and overarticulation in a noisey environment.   Skilled Therapeutic Interventions: Skilled treatment session focused on speech communication goals. SLP facilitated session by providing Min A cues to repeat multi-syllabic words with increased articulatory movements to increase speech intelligibility. Overall, pt's speech intelligibility is > 80% at the simple conversation level in a moderately noisy environment. Left a list of sentences containing multisyllabic words for him to read aloud. Pt left upright in wheelchair with all needs within reach. Continue per current plan of care.      Function:   Cognition Comprehension Comprehension assist level: Follows complex conversation/direction with no assist  Expression   Expression assist level: Expresses complex 90% of the time/cues < 10% of the time  Social Interaction Social Interaction assist level: Interacts appropriately with others with medication or extra time (anti-anxiety, antidepressant).  Problem Solving Problem solving assist level: Solves complex problems: With extra time  Memory Memory assist level: Complete Independence: No helper    Pain Pain Assessment Pain Assessment: No/denies pain Pain Score: 0-No pain  Therapy/Group: Individual Therapy   Areonna Bran B. Dreama Saaverton, M.S., CCC-SLP Speech-Language Pathologist   Meline Russaw 07/06/2016, 12:01 PM

## 2016-07-07 ENCOUNTER — Inpatient Hospital Stay (HOSPITAL_COMMUNITY): Payer: Self-pay

## 2016-07-07 ENCOUNTER — Inpatient Hospital Stay (HOSPITAL_COMMUNITY): Payer: Self-pay | Admitting: Physical Therapy

## 2016-07-07 ENCOUNTER — Inpatient Hospital Stay (HOSPITAL_COMMUNITY): Payer: Self-pay | Admitting: Speech Pathology

## 2016-07-07 ENCOUNTER — Inpatient Hospital Stay (HOSPITAL_COMMUNITY): Payer: Self-pay | Admitting: Occupational Therapy

## 2016-07-07 MED ORDER — FLUTICASONE PROPIONATE 50 MCG/ACT NA SUSP
1.0000 | Freq: Every day | NASAL | Status: DC
  Administered 2016-07-08 – 2016-07-15 (×8): 1 via NASAL
  Filled 2016-07-07: qty 16

## 2016-07-07 NOTE — Progress Notes (Signed)
Speech Language Pathology Weekly Progress and Session Note  Patient Details  Name: Steve Koch MRN: [id number] Date of Birth: April 30, 1960  Beginning of progress report period: June 30, 2016   End of progress report period: July 07, 2016   Today's Date: 07/07/2016 SLP Individual Time: 9518-8416 SLP Individual Time Calculation (min): 30 min  Short Term Goals: Week 2: SLP Short Term Goal 1 (Week 2): Pt will recall speech intelligibility strategies with Mod I cues.  SLP Short Term Goal 1 - Progress (Week 2): Met SLP Short Term Goal 2 (Week 2): Pt will achieve intelligibilty at the conversational level with supervision verbal cues for increased vocal intensity and overarticulation in a noisey environment.  SLP Short Term Goal 2 - Progress (Week 2): Met    New Short Term Goals: Week 3: SLP Short Term Goal 1 (Week 3): Pt will achieve intelligibilty at the conversational level with mod I use of increased vocal intensity and overarticulation in a noisey environment.  SLP Short Term Goal 2 (Week 3): Pt will monitor and correct verbal errors at the conversational level with mod I.    Weekly Progress Updates:  Pt has made functional gains and has met 2 out of 2 short term goals this reporting period.  Pt is overall supervision for intelligibility at the conversational level, although he did need up to min assist for use of strategies today due to increased fatigue (see daily note).  Pt demonstrates good awareness of verbal errors but needs intermittent supervision cues to correct them in the moment.  Pt and family education is ongoing.  Pt would continue to benefit from skilled ST while inpatient in order to maximize functional independence and reduce burden of care prior to discharge.     Intensity: Minumum of 1-2 x/day, 30 to 90 minutes Frequency: 3 to 5 out of 7 days Duration/Length of Stay: 14-21 days Treatment/Interventions: Cueing hierarchy;Functional tasks;Patient/family  education;Speech/Language facilitation   Daily Session  Skilled Therapeutic Interventions: Pt was seen for skilled ST targeting communication goals.  Pt reported having a "tough" day today after not having slept well last night.  This in combination with increased dose of Baclofen (per pt's and wife's report) has contributed to pt having increased lethargy throughout the day and increased effort for achievement of intelligibility in conversations.  Pt needed min assist verbal cues for repetition to clarify words during conversations with therapist and during structured oral reading tasks at the sentence and paragraph level.  Pt demonstrated good awareness and attempts to correct verbal errors.  Pt was returned to room and left in wheelchair with wife at bedside.  Continue per current plan of care.     Function:   Eating Eating                 Cognition Comprehension Comprehension assist level: Follows complex conversation/direction with no assist  Expression   Expression assist level: Expresses basic 75 - 89% of the time/requires cueing 10 - 24% of the time. Needs helper to occlude trach/needs to repeat words.  Social Interaction Social Interaction assist level: Interacts appropriately with others with medication or extra time (anti-anxiety, antidepressant).  Problem Solving Problem solving assist level: Solves complex problems: With extra time  Memory Memory assist level: Complete Independence: No helper   General    Pain Pain Assessment Pain Assessment: No/denies pain  Therapy/Group: Individual Therapy  Chelsie Burel, Selinda Orion 07/07/2016, 1:50 PM

## 2016-07-07 NOTE — Progress Notes (Signed)
Occupational Therapy Session Note  Patient Details  Name: Steve Koch MRN: [id number] Date of Birth: 07-17-1960  Today's Date: 07/07/2016 OT Individual Time: 0800-0900 OT Individual Time Calculation (min): 60 min    Short Term Goals: Week 2:  OT Short Term Goal 1 (Week 2): Pt will perform toilet transfer with max A in order to decrease level of assistance needed with transfer. OT Short Term Goal 2 (Week 2): Pt will perform LB dressing with max A OT Short Term Goal 3 (Week 2): Pt will perform LB clothing management with max A OT Short Term Goal 4 (Week 2): Pt will perform bathing tasks with mod A  Skilled Therapeutic Interventions/Progress Updates:    OT treatment session focused on LB dressing modifications, sit<>stand, standing tolerance, standing balance, and B UE coordination. Educated pt on friction reducing device to assist caregiver in ease of donning TED hose. Pt came to sitting EOB with Max A. Pt achieved sitting balance wit hmin A progressing to supervsision. Pt transferred sit<>stand in [place] w/ Max A +2 to transfer to wc. Pt brought to therapy gym and completed 3 sit<>stands in standing frame with Max A. Tolerated 5 mins standing focoused on anterior weight shift. Incorporated B UE weight bearing, reaching/ROM with B UE's and lateral pinch using sticky notes. Pt requiring prosimal support to reach-more support on R UE than L UE. Pt left seated in wc at end of session with needs met.   Therapy Documentation Precautions:  Precautions Precautions: Fall, Cervical Precaution Comments: no brace required Required Braces or Orthoses: Other Brace/Splint Other Brace/Splint: pt has RUE resting hand splint at night & dynamic hand splint for during the day Restrictions Weight Bearing Restrictions: No Pain: Pain Assessment Pain Assessment: No/denies pain  See Function Navigator for Current Functional Status.   Therapy/Group: Individual Therapy  Valma Cava 07/07/2016, 4:23  PM

## 2016-07-07 NOTE — Progress Notes (Signed)
Physical Therapy Weekly Progress Note  Patient Details  Name: Steve Koch MRN: [id number] Date of Birth: December 21, 1960  Beginning of progress report period: June 30, 2016 End of progress report period: July 07, 2016  Today's Date: 07/07/2016 PT Individual Time: 1005-1100 PT Individual Time Calculation (min): 55 min   Patient has met 0 of 4 short term goals, however demonstrating slow progress towards goals. Pt currently requires mod/maxA for bed mobility, maxA sit >stand, and +2A for stand pivot transfers and gait with RW; one person to assist with balance, weight shifting and cueing for foot placement, second person to manage and stabilize RW. Pt is limited by hypertonicity, clonus in BLEs and ROM limitations in gastroc/soleus, hamstrings, hip flexors, limited lumbopelvic dissociation and LE strength deficits. Pt continues to be motivated and hard working in all therapy sessions. Plan for family education prior to d/c for hands on training.  Patient continues to demonstrate the following deficits muscle weakness and muscle joint tightness, impaired timing and sequencing, abnormal tone, unbalanced muscle activation, decreased coordination and decreased motor planning and decreased sitting balance, decreased standing balance, decreased postural control and decreased balance strategies and therefore will continue to benefit from skilled PT intervention to increase functional independence with mobility.  Patient progressing slowly toward long term goals..  Plan of care revisions: w/c goal d/c as pt not able to functionally propel w/c with UE or LE. Home environment gait goal d/c; will likely not recommend pt ambulate at home with wife until able to demonstrate more progress with follow up therapy. .  PT Short Term Goals Week 2:  PT Short Term Goal 1 (Week 2): Pt will demonstrate bed mobility consistent modA PT Short Term Goal 1 - Progress (Week 2): Progressing toward goal PT Short Term Goal 2  (Week 2): Pt will demonstrate sit <>stand modA +1 PT Short Term Goal 2 - Progress (Week 2): Progressing toward goal PT Short Term Goal 3 (Week 2): Pt will demonstrate gait x50' with consistent modA +1 PT Short Term Goal 3 - Progress (Week 2): Progressing toward goal PT Short Term Goal 4 (Week 2): Pt will demonstrate dynamic standing balance with minA x5 min PT Short Term Goal 4 - Progress (Week 2): Progressing toward goal Week 3:  PT Short Term Goal 1 (Week 3): =LTG due to estimated LOS; w/c mobility goals d/c, home ambulation goal d/c.  Skilled Therapeutic Interventions/Progress Updates: Pt received seated in w/c, denies pain and agreeable to treatment. Transported to/from gym [place]. Transfer w/c >mat table with slideboard and maxA; significantly increased time required and multiple assist techniques trialed to determine most effective method. Sit >stand from low mat table x5 attempts before successful; improving anterior weight shift however poor LE activation once in proper alignment. Gait with RW and maxA +2 for management of RW and facilitation at pelvis for weight shifting and anterior tilt. At end of gait trial, pt with posterior LOB, unable to correct d/t fatigue and required totalA +3 to safely sit in w/c. Pt reports LE pain and fatigue from wearing PRAFOs for the first time last night. Discussed gradual increase of wear to improve tolerance. PROM to BLE hamstrings, gastroc, soleus 2x1 min each. Remained seated in w/c at end of session, all needs in reach.      Therapy Documentation Precautions:  Precautions Precautions: Fall, Cervical Precaution Comments: no brace required Required Braces or Orthoses: Other Brace/Splint Other Brace/Splint: pt has RUE resting hand splint at night & dynamic hand splint for during the day  Restrictions Weight Bearing Restrictions: No   See Function Navigator for Current Functional Status.  Therapy/Group: Individual Therapy  Luberta Mutter 07/07/2016, 7:47 AM

## 2016-07-07 NOTE — Progress Notes (Signed)
Occupational Therapy Session Note  Patient Details  Name: Steve Koch MRN: [id number] Date of Birth: April 26, 1960  Today's Date: 07/07/2016 OT Individual Time: 1400-1500 OT Individual Time Calculation (min): 60 min    Short Term Goals: Week 2:  OT Short Term Goal 1 (Week 2): Pt will perform toilet transfer with max A in order to decrease level of assistance needed with transfer. OT Short Term Goal 2 (Week 2): Pt will perform LB dressing with max A OT Short Term Goal 3 (Week 2): Pt will perform LB clothing management with max A OT Short Term Goal 4 (Week 2): Pt will perform bathing tasks with mod A  Skilled Therapeutic Interventions/Progress Updates:    1:1. Pt wife present throughout session. Focus of session on LUE use, sitting balance, functional transfers and sit to stand. Pt 2x squat pivot transfer w/c<>EOM with MAX A of 2 (to L) and MOD A of 2 (to R) with VC for trunk flexion, foot placement and safety. Pt seated EOM ~30 min reaching min ranges outside BOS with HOH A for reaching with LUE to decrease compensatory movement to obtain connect 4 pieces and place on board/clean up after game. In kitchen pt stands at counter for 2 trials with MAX A of 2, knee blocking and VC for posture/safety awareness. While standing pt completes towel glides bearing weight into BUE for shoulder flexion/extension. Pt reporting fatigue and requesting to sit down. Pt finishes 1x10 sets of towel glides in horizontal ab/adduciton, diagonals and circles. Exited session with pt seated in w/c with call light in reach, wife present and all needs met.   Therapy Documentation Precautions:  Precautions Precautions: Fall, Cervical Precaution Comments: no brace required Required Braces or Orthoses: Other Brace/Splint Other Brace/Splint: pt has RUE resting hand splint at night & dynamic hand splint for during the day Restrictions Weight Bearing Restrictions: No Pain: Pain Assessment Pain Assessment: No/denies  pain  See Function Navigator for Current Functional Status.   Therapy/Group: Individual Therapy  Tonny Branch 07/07/2016, 3:10 PM

## 2016-07-07 NOTE — Plan of Care (Signed)
Problem: RH Ambulation Goal: LTG Patient will ambulate in home environment (PT) LTG: Patient will ambulate in home environment, # of feet with assistance (PT).  Outcome: Not Applicable Date Met: 07/07/16 D/c due to slow progress  Problem: RH Wheelchair Mobility Goal: LTG Patient will propel w/c in controlled environment (PT) LTG: Patient will propel wheelchair in controlled environment, # of feet with assist (PT)  Outcome: Not Applicable Date Met: 07/07/16 D/c due to slow progress Goal: LTG Patient will propel w/c in home environment (PT) LTG: Patient will propel wheelchair in home environment, # of feet with assistance (PT).  Outcome: Not Applicable Date Met: 07/07/16 D/c due to slow progress   

## 2016-07-07 NOTE — Progress Notes (Signed)
[place] [facility]     PROGRESS NOTE  Subjective/Complaints:  No issues overnite, cont bowel no need for supp yesterday, cont bladder using urinal  ROS: pt denies nausea, vomiting, diarrhea, cough, shortness of breath or chest pain      Objective: Vital Signs: Blood pressure 112/64, pulse (!) 58, temperature 97.8 F (36.6 C), temperature source Oral, resp. rate 14, height 6\' 1"  (1.854 m), weight 114 kg (251 lb 6.4 oz), SpO2 97 %. No results found.  Recent Labs  07/05/16 0541  WBC 9.8  HGB 15.0  HCT 43.3  PLT 282    Recent Labs  07/05/16 0541  NA 141  K 4.7  CL 105  GLUCOSE 102*  BUN 15  CREATININE 0.92  CALCIUM 9.3   CBG (last 3)  No results for input(s): GLUCAP in the last 72 hours.  Wt Readings from Last 3 Encounters:  07/03/16 114 kg (251 lb 6.4 oz)  06/21/16 116.9 kg (257 lb 11.5 oz)  03/09/16 116.1 kg (256 lb)    Physical Exam:  BP 112/64 (BP Location: Right Arm)   Pulse (!) 58   Temp 97.8 F (36.6 C) (Oral)   Resp 14   Ht 6\' 1"  (1.854 m)   Wt 114 kg (251 lb 6.4 oz)   SpO2 97%   BMI 33.17 kg/m  Constitutional: He appears well-developed and well-nourished.  HENT: Normocephalic and atraumatic. Bruises on face/around eyes Eyes: EOMI. No discharge.  Cardiovascular: RRR without murmur. No JVD  Respiratory: CTA Bilaterally without wheezes or rales. Normal effort    GI: Soft. Bowel sounds are normal.  Musculoskeletal: He exhibits edema RUE. Both heel cords tight. Can't range past -10 degrees at either ankle Neurological: He is alert and oriented.  Dysarthric speech Motor: RUE: 1-2/5 prox to distal.  ---no changes LUE: Shoulder abduction, elbow flexion/extension 4/5, wrist, hand grip 2+/5, contractures of digits 3 and 4.  B/l LE 4-/5 prox to distal.  Resting tone in ue 1/4 and 1+/4 LE's Skin: Skin is warm and dry.  Psychiatric: Normal mood and behavior.  Cognition and memory are not impaired  Assessment/Plan: 1.  Functional deficits secondary to cervical myelopathy which require 3+ hours per day of interdisciplinary therapy in a comprehensive inpatient rehab setting. Physiatrist is providing close team supervision and 24 hour management of active medical problems listed below. Physiatrist and rehab team continue to assess barriers to discharge/monitor patient progress toward functional and medical goals.  Function:  Bathing Bathing position Bathing activity did not occur: Refused Position: Systems developerhower  Bathing parts Body parts bathed by patient: Abdomen Body parts bathed by helper: Right arm, Left arm, Chest, Front perineal area, Buttocks, Right upper leg, Left upper leg, Right lower leg, Left lower leg, Back  Bathing assist Assist Level: 2 helpers      Upper Body Dressing/Undressing Upper body dressing Upper body dressing/undressing activity did not occur: Refused What is the patient wearing?: Pull over shirt/dress     Pull over shirt/dress - Perfomed by patient: Thread/unthread right sleeve, Thread/unthread left sleeve Pull over shirt/dress - Perfomed by helper: Put head through opening, Pull shirt over trunk        Upper body assist Assist Level: Touching or steadying assistance(Pt > 75%)      Lower Body Dressing/Undressing Lower body dressing   What is the patient wearing?: Shoes, Pants, Socks       Pants- Performed by helper: Thread/unthread right pants leg, Pull pants up/down, Thread/unthread left pants leg  Socks - Performed by helper: Don/doff right sock, Don/doff left sock   Shoes - Performed by helper: Don/doff right shoe, Don/doff left shoe          Lower body assist Assist for lower body dressing: 2 Helpers      Toileting Toileting Toileting activity did not occur:  (using urinal, no BM)   Toileting steps completed by helper: Adjust clothing prior to toileting, Performs perineal hygiene, Adjust clothing after toileting Toileting Assistive Devices: Other (comment)  (stedy)  Toileting assist Assist level: Two helpers   Transfers Chair/bed transfer   Chair/bed transfer method: Stand pivot Chair/bed transfer assist level: 2 helpers (mod A+2) Chair/bed transfer assistive device: Gaffer: Magazine features editor activity did not occur: Safety/medical concerns   Max distance: 48ft Assist level: 2 helpers (mod A+2)   Education administrator activity did not occur: Safety/medical concerns Type: Manual Max wheelchair distance: 30 Assist Level: Touching or steadying assistance (Pt > 75%)  Cognition Comprehension Comprehension assist level: Follows complex conversation/direction with no assist  Expression Expression assist level: Expresses complex 90% of the time/cues < 10% of the time  Social Interaction Social Interaction assist level: Interacts appropriately with others with medication or extra time (anti-anxiety, antidepressant).  Problem Solving Problem solving assist level: Solves complex problems: With extra time  Memory Memory assist level: Complete Independence: No helper    Medical Problem List and Plan: 1.  Deficits in mobility as well as inability to carry out ADL tasks secondary to cervical myelopathy, hx of ACDF in February 2018.  Cont CIR therapies     -  continue working on articulation with SLP/HEP 2.  DVT Prophylaxis/Anticoagulation: Pharmaceutical: Lovenox 3. Pain Management: tylenol prn.  4. Mood: LCSW to follow for evaluation and support. Team to provide ego support.  5. Neuropsych: This patient is capable of making decisions on his own behalf. 6. Skin/Wound Care: Monitor daily. Routine pressure relief measures 7. Fluids/Electrolytes/Nutrition: reasonable PO intake.              BMP WNL 6/20 8. HTN: Monitor BP bid. Continue Norvasc.   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 Vitamin B 12 deficiency: on supplement.  11. H/O of  Anxiety: Monitor for now--does not want to start any meds. Trazodone at nights prn to help manage anxiety at nights.   Stable. Pt very engaged with team    LOS (Days) 14 A FACE TO FACE EVALUATION WAS PERFORMED  Amed Datta E 07/07/2016 8:08 AM

## 2016-07-08 ENCOUNTER — Inpatient Hospital Stay (HOSPITAL_COMMUNITY): Payer: Self-pay

## 2016-07-08 MED ORDER — BACLOFEN 5 MG HALF TABLET
15.0000 mg | ORAL_TABLET | Freq: Four times a day (QID) | ORAL | Status: DC
  Administered 2016-07-08 – 2016-07-09 (×4): 15 mg via ORAL
  Filled 2016-07-08 (×4): qty 1

## 2016-07-08 NOTE — Progress Notes (Signed)
[place] [facility] RE[facility]ILITATION     PROGRESS NOTE  Subjective/Complaints:  Asking about ALS testing and B12 level Tired, wonders if baclofen is contributing  ROS: pt denies nausea, vomiting, diarrhea, cough, shortness of breath or chest pain      Objective: Vital Signs: Blood pressure 112/80, pulse 62, temperature 97.8 F (36.6 C), temperature source Oral, resp. rate 16, height 6\' 1"  (1.854 m), weight 114 kg (251 lb 6.4 oz), SpO2 94 %. No results found. No results for input(s): WBC, HGB, HCT, PLT in the last 72 hours. No results for input(s): NA, K, CL, GLUCOSE, BUN, CREATININE, CALCIUM in the last 72 hours.  Invalid input(s): CO CBG (last 3)  No results for input(s): GLUCAP in the last 72 hours.  Wt Readings from Last 3 Encounters:  07/03/16 114 kg (251 lb 6.4 oz)  06/21/16 116.9 kg (257 lb 11.5 oz)  03/09/16 116.1 kg (256 lb)    Physical Exam:  BP 112/80 (BP Location: Right Arm)   Pulse 62   Temp 97.8 F (36.6 C) (Oral)   Resp 16   Ht 6\' 1"  (1.854 m)   Wt 114 kg (251 lb 6.4 oz)   SpO2 94%   BMI 33.17 kg/m  Constitutional: He appears well-developed and well-nourished.  HENT: Normocephalic and atraumatic. Bruises on face/around eyes Eyes: EOMI. No discharge.  Cardiovascular: RRR without murmur. No JVD  Respiratory: CTA Bilaterally without wheezes or rales. Normal effort    GI: Soft. Bowel sounds are normal.  Musculoskeletal: He exhibits edema RUE. Both heel cords tight. Can't range past -10 degrees at either ankle Neurological: He is alert and oriented.  Dysarthric speech Motor: RUE: 1-2/5 prox to distal.  ---no changes LUE: Shoulder abduction, elbow flexion/extension 4/5, wrist, hand grip 2+/5, contractures of digits 3 and 4.  B/l LE 4-/5 prox to distal.  Resting tone in ue 1/4 and 1+/4 LE's Skin: Skin is warm and dry.  Psychiatric: Normal mood and behavior.  Cognition and memory are not impaired  Assessment/Plan: 1. Functional deficits  secondary to cervical myelopathy which require 3+ hours per day of interdisciplinary therapy in a comprehensive inpatient rehab setting. Physiatrist is providing close team supervision and 24 hour management of active medical problems listed below. Physiatrist and rehab team continue to assess barriers to discharge/monitor patient progress toward functional and medical goals.  Function:  Bathing Bathing position Bathing activity did not occur: Refused Position: Systems developer parts bathed by patient: Abdomen Body parts bathed by helper: Right arm, Left arm, Chest, Front perineal area, Buttocks, Right upper leg, Left upper leg, Right lower leg, Left lower leg, Back  Bathing assist Assist Level: 2 helpers      Upper Body Dressing/Undressing Upper body dressing Upper body dressing/undressing activity did not occur: Refused What is the patient wearing?: Pull over shirt/dress     Pull over shirt/dress - Perfomed by patient: Thread/unthread right sleeve, Thread/unthread left sleeve Pull over shirt/dress - Perfomed by helper: Put head through opening, Pull shirt over trunk        Upper body assist Assist Level: Touching or steadying assistance(Pt > 75%)      Lower Body Dressing/Undressing Lower body dressing   What is the patient wearing?: Shoes, Pants, Socks       Pants- Performed by helper: Thread/unthread right pants leg, Pull pants up/down, Thread/unthread left pants leg       Socks - Performed by helper: Don/doff right sock, Don/doff left sock   Shoes -  Performed by helper: Don/doff right shoe, Don/doff left shoe          Lower body assist Assist for lower body dressing: 2 Helpers      Toileting Toileting Toileting activity did not occur:  (using urinal, no BM)   Toileting steps completed by helper: Adjust clothing prior to toileting, Performs perineal hygiene, Adjust clothing after toileting Toileting Assistive Devices: Other (comment) (stedy)  Toileting  assist Assist level: Two helpers   Transfers Chair/bed transfer   Chair/bed transfer method: Lateral scoot Chair/bed transfer assist level: Maximal assist (Pt 25 - 49%/lift and lower) Chair/bed transfer assistive device: Sliding board, Armrests Mechanical lift: Stedy   Locomotion Ambulation Ambulation activity did not occur: Safety/medical concerns   Max distance: 60 Assist level: 2 helpers   Education administratorWheelchair Wheelchair activity did not occur: Safety/medical concerns Type: Manual Max wheelchair distance: 30 Assist Level: Touching or steadying assistance (Pt > 75%)  Cognition Comprehension Comprehension assist level: Follows complex conversation/direction with no assist  Expression Expression assist level: Expresses basic 75 - 89% of the time/requires cueing 10 - 24% of the time. Needs helper to occlude trach/needs to repeat words.  Social Interaction Social Interaction assist level: Interacts appropriately with others with medication or extra time (anti-anxiety, antidepressant).  Problem Solving Problem solving assist level: Solves complex problems: With extra time  Memory Memory assist level: Complete Independence: No helper    Medical Problem List and Plan: 1.  Deficits in mobility as well as inability to carry out ADL tasks secondary to cervical myelopathy, hx of ACDF in February 2018.  Cont CIR therapies     -  continue working on articulation with SLP/HEP 2.  DVT Prophylaxis/Anticoagulation: Pharmaceutical: Lovenox 3. Pain Management: tylenol prn.  4. Mood: LCSW to follow for evaluation and support. Team to provide ego support.  5. Neuropsych: This patient is capable of making decisions on his own behalf. 6. Skin/Wound Care: Monitor daily. Routine pressure relief measures 7. Fluids/Electrolytes/Nutrition: reasonable PO intake.              BMP WNL 6/20-discussed result with pt 8. HTN: Monitor BP bid. Continue Norvasc.   Vitals:   07/07/16 1528 07/08/16 0532  BP: 101/65  112/80  Pulse: 63 62  Resp: 16 16  Temp: 98.1 F (36.7 C) 97.8 F (36.6 C)   9. 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 Vitamin B 12 deficiency: on supplement. Last level 6/6 was normal 11. H/O of  Anxiety: Monitor for now--does not want to start any meds. Trazodone at nights prn to help manage anxiety at nights.   Stable. Pt very engaged with team    LOS (Days) 15 A FACE TO FACE EVALUATION WAS PERFORMED  Erick ColaceKIRSTEINS,ANDREW E 07/08/2016 8:23 AM

## 2016-07-08 NOTE — Plan of Care (Signed)
Problem: SCI BLADDER ELIMINATION Goal: RH STG MANAGE BLADDER WITH ASSISTANCE STG Manage Bladder With min Assistance   Outcome: Not Progressing Total assist with urinal placement due to fine motor control

## 2016-07-08 NOTE — Plan of Care (Signed)
Problem: SCI BLADDER ELIMINATION Goal: RH STG MANAGE BLADDER WITH ASSISTANCE STG Manage Bladder With min Assistance   Outcome: Not Progressing Requiring total assist with using urinal

## 2016-07-08 NOTE — Progress Notes (Signed)
Occupational Therapy Session Note  Patient Details  Name: Steve Koch MRN: [id number] Date of Birth: [birth year]  Today's Date: 07/08/2016 OT Individual Time: [id number] OT Individual Time Calculation (min): 55 min    Short Term Goals: Week 2:  OT Short Term Goal 1 (Week 2): Pt will perform toilet transfer with max A in order to decrease level of assistance needed with transfer. OT Short Term Goal 2 (Week 2): Pt will perform LB dressing with max A OT Short Term Goal 3 (Week 2): Pt will perform LB clothing management with max A OT Short Term Goal 4 (Week 2): Pt will perform bathing tasks with mod A  Skilled Therapeutic Interventions/Progress Updates:    1:1. Pt seated in w/c with wife present upon arrival not reporting pain. OT dons ted hose with TOTAL A since ankles visibly swollen. OT propels w/c to all tx destinations for time management. Pt sit to stand with MAX A of 2 with VC for anterior trunk flexion to come to standing in standing frame. Pt stands supported bearing weight through BLE for ~35 min while playing connect 4 game as a "rematch" from previous session. Pt with more thumb movement noted this session, however pt still requires AAROM to achieve shoulder flexion at shoulder level to place pieces into game. Pt places all chips back into box at end of game with increased time and VC to "open big" to extend fingers. Exited session with pt seated in w/c with call light in reach and wife present in room.   Therapy Documentation Precautions:  Precautions Precautions: Fall, Cervical Precaution Comments: no brace required Required Braces or Orthoses: Other Brace/Splint Other Brace/Splint: pt has RUE resting hand splint at night & dynamic hand splint for during the day Restrictions Weight Bearing Restrictions: No  See Function Navigator for Current Functional Status.   Therapy/Group: Individual Therapy  Shon HaleStephanie M Robynn Marcel 07/08/2016, 5:46 PM

## 2016-07-09 ENCOUNTER — Inpatient Hospital Stay (HOSPITAL_COMMUNITY): Payer: BLUE CROSS/BLUE SHIELD | Admitting: Physical Therapy

## 2016-07-09 MED ORDER — BACLOFEN 10 MG PO TABS
10.0000 mg | ORAL_TABLET | Freq: Four times a day (QID) | ORAL | Status: DC
  Administered 2016-07-09 – 2016-07-15 (×24): 10 mg via ORAL
  Filled 2016-07-09 (×24): qty 1

## 2016-07-09 NOTE — Progress Notes (Signed)
[place] PHYSICAL MEDICINE & REHABILITATION     PROGRESS NOTE  Subjective/Complaints:   Reduced baclofen to 15mg  a little more shoulder spasm but pt a little less drowsy as well  ROS: pt denies nausea, vomiting, diarrhea, cough, shortness of breath or chest pain      Objective: Vital Signs: Blood pressure 114/63, pulse 60, temperature 98.3 F (36.8 C), temperature source Oral, resp. rate 18, height 6\' 1"  (1.854 m), weight 114 kg (251 lb 6.4 oz), SpO2 97 %. No results found. No results for input(s): WBC, HGB, HCT, PLT in the last 72 hours. No results for input(s): NA, K, CL, GLUCOSE, BUN, CREATININE, CALCIUM in the last 72 hours.  Invalid input(s): CO CBG (last 3)  No results for input(s): GLUCAP in the last 72 hours.  Wt Readings from Last 3 Encounters:  07/03/16 114 kg (251 lb 6.4 oz)  06/21/16 116.9 kg (257 lb 11.5 oz)  03/09/16 116.1 kg (256 lb)    Physical Exam:  BP 114/63 (BP Location: Right Arm)   Pulse 60   Temp 98.3 F (36.8 C) (Oral)   Resp 18   Ht 6\' 1"  (1.854 m)   Wt 114 kg (251 lb 6.4 oz)   SpO2 97%   BMI 33.17 kg/m  Constitutional: He appears well-developed and well-nourished.  HENT: Normocephalic and atraumatic. Bruises on face/around eyes Eyes: EOMI. No discharge.  Cardiovascular: RRR without murmur. No JVD  Respiratory: CTA Bilaterally without wheezes or rales. Normal effort    GI: Soft. Bowel sounds are normal.  Musculoskeletal: He exhibits edema RUE. Both heel cords tight. Can't range past -10 degrees at either ankle Neurological: He is alert and oriented.  Dysarthric speech Motor: RUE: 1-2/5 prox to distal.  ---no changes LUE: Shoulder abduction, elbow flexion/extension 4/5, wrist, hand grip 2+/5, contractures of digits 3 and 4.  B/l LE 4-/5 prox to distal.  Resting tone in ue 1/4 and 1+/4 LE's Skin: Skin is warm and dry.  Psychiatric: Normal mood and behavior.  Cognition and memory are not impaired  Assessment/Plan: 1. Functional  deficits secondary to cervical myelopathy which require 3+ hours per day of interdisciplinary therapy in a comprehensive inpatient rehab setting. Physiatrist is providing close team supervision and 24 hour management of active medical problems listed below. Physiatrist and rehab team continue to assess barriers to discharge/monitor patient progress toward functional and medical goals.  Function:  Bathing Bathing position Bathing activity did not occur: Refused Position: Systems developerhower  Bathing parts Body parts bathed by patient: Abdomen Body parts bathed by helper: Right arm, Left arm, Chest, Front perineal area, Buttocks, Right upper leg, Left upper leg, Right lower leg, Left lower leg, Back  Bathing assist Assist Level: 2 helpers      Upper Body Dressing/Undressing Upper body dressing Upper body dressing/undressing activity did not occur: Refused What is the patient wearing?: Pull over shirt/dress     Pull over shirt/dress - Perfomed by patient: Thread/unthread right sleeve, Thread/unthread left sleeve Pull over shirt/dress - Perfomed by helper: Put head through opening, Pull shirt over trunk        Upper body assist Assist Level: Touching or steadying assistance(Pt > 75%)      Lower Body Dressing/Undressing Lower body dressing   What is the patient wearing?: Shoes, Pants, Socks       Pants- Performed by helper: Thread/unthread right pants leg, Pull pants up/down, Thread/unthread left pants leg       Socks - Performed by helper: Don/doff right sock, Don/doff  left sock   Shoes - Performed by helper: Don/doff right shoe, Don/doff left shoe          Lower body assist Assist for lower body dressing: 2 Helpers      Toileting Toileting Toileting activity did not occur:  (using urinal, no BM)   Toileting steps completed by helper: Adjust clothing prior to toileting, Performs perineal hygiene, Adjust clothing after toileting Toileting Assistive Devices: Other (comment) (stedy)   Toileting assist Assist level: Two helpers   Transfers Chair/bed transfer   Chair/bed transfer method: Lateral scoot Chair/bed transfer assist level: Maximal assist (Pt 25 - 49%/lift and lower) Chair/bed transfer assistive device: Sliding board, Armrests Mechanical lift: Stedy   Locomotion Ambulation Ambulation activity did not occur: Safety/medical concerns   Max distance: 60 Assist level: 2 helpers   Education administrator activity did not occur: Safety/medical concerns Type: Manual Max wheelchair distance: 30 Assist Level: Touching or steadying assistance (Pt > 75%)  Cognition Comprehension Comprehension assist level: Follows complex conversation/direction with no assist  Expression Expression assist level: Expresses basic 75 - 89% of the time/requires cueing 10 - 24% of the time. Needs helper to occlude trach/needs to repeat words.  Social Interaction Social Interaction assist level: Interacts appropriately with others with medication or extra time (anti-anxiety, antidepressant).  Problem Solving Problem solving assist level: Solves complex problems: With extra time  Memory Memory assist level: Complete Independence: No helper    Medical Problem List and Plan: 1.  Deficits in mobility as well as inability to carry out ADL tasks secondary to cervical myelopathy, hx of ACDF in February 2018.  Cont CIR therapies     -PT, OT, SLP 2.  DVT Prophylaxis/Anticoagulation: Pharmaceutical: Lovenox 3. Pain Management: tylenol prn.  4. Mood: LCSW to follow for evaluation and support. Team to provide ego support.  5. Neuropsych: This patient is capable of making decisions on his own behalf. 6. Skin/Wound Care: Monitor daily. Routine pressure relief measures 7. Fluids/Electrolytes/Nutrition: reasonable PO intake.              BMP WNL 6/20-discussed result with pt 8. HTN: Monitor BP bid. 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 Vitamin B 12 deficiency: on supplement. Last level 6/6 was normal 11. H/O of  Anxiety: Monitor for now--does not want to start any meds. Trazodone at nights prn to help manage anxiety at nights.   Stable. Pt very engaged with team    LOS (Days) 16 A FACE TO FACE EVALUATION WAS PERFORMED  Erick Colace 07/09/2016 8:36 AM

## 2016-07-09 NOTE — Plan of Care (Signed)
Problem: SCI BLADDER ELIMINATION Goal: RH STG MANAGE BLADDER WITH ASSISTANCE STG Manage Bladder With min Assistance   Outcome: Not Progressing Total assist with urinal

## 2016-07-09 NOTE — Progress Notes (Signed)
Physical Therapy Session Note  Patient Details  Name: Steve Koch MRN: [id number] Date of Birth: [birth year]  Today's Date: 07/09/2016 PT Individual Time: 1115-1200 PT Individual Time Calculation (min): 45 min   Short Term Goals: Week 1:  PT Short Term Goal 1 (Week 1): Pt will complete sit<>stand transfer with max assist +1. PT Short Term Goal 1 - Progress (Week 1): Met PT Short Term Goal 2 (Week 1): Pt will complete bed<>chair with max assist +1.  PT Short Term Goal 2 - Progress (Week 1): Met PT Short Term Goal 3 (Week 1): Pt will initiate gait training.  PT Short Term Goal 3 - Progress (Week 1): Met  Skilled Therapeutic Interventions/Progress Updates:  Pt was seen bedside in the am. Pt transported to gym. Treatment focused on sit to stand transfers in parallel bars. Pt performed multiple sit to stand transfers with min to mod A and verbal cues for sequencing and technique. Following treatment pt returned to room and left sitting up in w/c with call bell within reach.   Therapy Documentation Precautions:  Precautions Precautions: Fall, Cervical Precaution Comments: no brace required Required Braces or Orthoses: Other Brace/Splint Other Brace/Splint: pt has RUE resting hand splint at night & dynamic hand splint for during the day Restrictions Weight Bearing Restrictions: No General:   Pain: No c/o pain.   See Function Navigator for Current Functional Status.   Therapy/Group: Individual Therapy  Dub Amis 07/09/2016, 12:29 PM

## 2016-07-10 ENCOUNTER — Inpatient Hospital Stay (HOSPITAL_COMMUNITY): Payer: BLUE CROSS/BLUE SHIELD | Admitting: Speech Pathology

## 2016-07-10 ENCOUNTER — Inpatient Hospital Stay (HOSPITAL_COMMUNITY): Payer: Self-pay

## 2016-07-10 ENCOUNTER — Inpatient Hospital Stay (HOSPITAL_COMMUNITY): Payer: BLUE CROSS/BLUE SHIELD | Admitting: Occupational Therapy

## 2016-07-10 ENCOUNTER — Inpatient Hospital Stay (HOSPITAL_COMMUNITY): Payer: Self-pay | Admitting: Physical Therapy

## 2016-07-10 NOTE — Progress Notes (Signed)
Occupational Therapy Session Note  Patient Details  Name: Steve Koch MRN: [id number] Date of Birth: 03/14/1960  Today's Date: 07/10/2016 OT Individual Time: 1000-1100 OT Individual Time Calculation (min): 60 min    Short Term Goals: Week 2:  OT Short Term Goal 1 (Week 2): Pt will perform toilet transfer with max A in order to decrease level of assistance needed with transfer. OT Short Term Goal 2 (Week 2): Pt will perform LB dressing with max A OT Short Term Goal 3 (Week 2): Pt will perform LB clothing management with max A OT Short Term Goal 4 (Week 2): Pt will perform bathing tasks with mod A Week 3:     Skilled Therapeutic Interventions/Progress Updates:    Pt engaged in BUE therapeutic activities to increase functional use of BUE and independence with BADLs.  Pt performed slide board transfers X 2 with max A and verbal cues for board placement and sequencing.  Pt initially engaged in Ciscooom Ball tasks with support provided at B elbows to facilitate pt initiating shoulder adduction movement to complete tasks.  Pt able to initiate movement but required assistance to complete tasks to full range of shoulder movement.  Pt transitioned to playing Connect 4 X 2 with Recreational Therapist.  Support proved at pt's L elbow to reduce gravity.  Pt able to use gross grasp to grasp and release tokens to place in game.  Pt returned to w/c and room.  Pt remained in w/c with all needs within reach.   Therapy Documentation Precautions:  Precautions Precautions: Fall, Cervical Precaution Comments: no brace required Required Braces or Orthoses: Other Brace/Splint Other Brace/Splint: pt has RUE resting hand splint at night & dynamic hand splint for during the day Restrictions Weight Bearing Restrictions: No  Pain:    See Function Navigator for Current Functional Status.   Therapy/Group: Individual Therapy  [name], Camisha Srey Chappell 07/10/2016, 12:12 PM

## 2016-07-10 NOTE — Progress Notes (Signed)
Occupational Therapy Weekly Progress Note  Patient Details  Name: Steve Koch MRN: [id number] Date of Birth: 11/30/1960  Beginning of progress report period: June 30, 2016 End of progress report period: July 10, 2016  Patient has met 2 of 4 short term goals.  Pt made slow but steady progress with functional transfers with PFRW.  Pt requires max A for sit<>stand and mod A/min A for transfer.  Pt continues to required increased assistance with dressing tasks but is able to complete bathing tasks with mod A.  Pt requires max multimodal cues for sequencing with sit<>stand and functional transfers.  Slide board transfer training has been initiated and pt/wife verbalized understanding of use of board for safer transfers.  Patient continues to demonstrate the following deficits: muscle weakness, decreased cardiorespiratoy endurance, decreased safety awareness and coordination, strength and decreased sitting balance, decreased standing balance, decreased postural control and decreased balance strategies and therefore will continue to benefit from skilled OT intervention to enhance overall performance with BADL.  Patient not progressing toward long term goals.  See goal revision..  Plan of care revisions: Goals downgraded to max A for grooming, shower transfer, and toilet transfer. LB dressing and toileting goal discontinued secondary to lack of progress. UB dressing downgraded to mod A..  OT Short Term Goals Week 2:  OT Short Term Goal 1 (Week 2): Pt will perform toilet transfer with max A in order to decrease level of assistance needed with transfer. OT Short Term Goal 1 - Progress (Week 2): Met OT Short Term Goal 2 (Week 2): Pt will perform LB dressing with max A OT Short Term Goal 2 - Progress (Week 2): Progressing toward goal OT Short Term Goal 3 (Week 2): Pt will perform LB clothing management with max A OT Short Term Goal 3 - Progress (Week 2): Progressing toward goal OT Short Term Goal 4  (Week 2): Pt will perform bathing tasks with mod A OT Short Term Goal 4 - Progress (Week 2): Met Week 3:  OT Short Term Goal 1 (Week 3): STG=LTG secondary to ELOS      Therapy Documentation Precautions:  Precautions Precautions: Fall, Cervical Precaution Comments: no brace required Required Braces or Orthoses: Other Brace/Splint Other Brace/Splint: pt has RUE resting hand splint at night & dynamic hand splint for during the day Restrictions Weight Bearing Restrictions: No  See Function Navigator for Current Functional Status.    Leotis Shames [facility] 07/10/2016, 3:15 PM

## 2016-07-10 NOTE — Progress Notes (Signed)
[place] PHYSICAL MEDICINE & REHABILITATION     PROGRESS NOTE  Subjective/Complaints:   Bowels are moving ok per RN, no new issues overnite  ROS: pt denies nausea, vomiting, diarrhea, cough, shortness of breath or chest pain      Objective: Vital Signs: Blood pressure 122/72, pulse 60, temperature 98.3 F (36.8 C), temperature source Oral, resp. rate (!) 1, height 6\' 1"  (1.854 m), weight 114 kg (251 lb 6.4 oz), SpO2 98 %. No results found. No results for input(s): WBC, HGB, HCT, PLT in the last 72 hours. No results for input(s): NA, K, CL, GLUCOSE, BUN, CREATININE, CALCIUM in the last 72 hours.  Invalid input(s): CO CBG (last 3)  No results for input(s): GLUCAP in the last 72 hours.  Wt Readings from Last 3 Encounters:  07/03/16 114 kg (251 lb 6.4 oz)  06/21/16 116.9 kg (257 lb 11.5 oz)  03/09/16 116.1 kg (256 lb)    Physical Exam:  BP 122/72 (BP Location: Left Arm)   Pulse 60   Temp 98.3 F (36.8 C) (Oral)   Resp (!) 1   Ht 6\' 1"  (1.854 m)   Wt 114 kg (251 lb 6.4 oz)   SpO2 98%   BMI 33.17 kg/m  Constitutional: He appears well-developed and well-nourished.  HENT: Normocephalic and atraumatic. Bruises on face/around eyes Eyes: EOMI. No discharge.  Cardiovascular: RRR without murmur. No JVD  Respiratory: CTA Bilaterally without wheezes or rales. Normal effort    GI: Soft. Bowel sounds are normal.  Musculoskeletal: He exhibits edema RUE. Both heel cords tight. Can't range past -10 degrees at either ankle Neurological: He is alert and oriented.  Dysarthric speech Motor: RUE: 1-2/5 prox to distal.  ---no changes LUE: Shoulder abduction, elbow flexion/extension 4/5, wrist, hand grip 2+/5, contractures of digits 3 and 4.  B/l LE 4-/5 prox to distal.  Resting tone in ue 1/4 and 1+/4 LE's Skin: Skin is warm and dry.  Psychiatric: Normal mood and behavior.  Cognition and memory are not impaired  Assessment/Plan: 1. Functional deficits secondary to cervical  myelopathy which require 3+ hours per day of interdisciplinary therapy in a comprehensive inpatient rehab setting. Physiatrist is providing close team supervision and 24 hour management of active medical problems listed below. Physiatrist and rehab team continue to assess barriers to discharge/monitor patient progress toward functional and medical goals.  Function:  Bathing Bathing position Bathing activity did not occur: Refused Position: Systems developerhower  Bathing parts Body parts bathed by patient: Abdomen Body parts bathed by helper: Right arm, Left arm, Chest, Front perineal area, Buttocks, Right upper leg, Left upper leg, Right lower leg, Left lower leg, Back  Bathing assist Assist Level: 2 helpers      Upper Body Dressing/Undressing Upper body dressing Upper body dressing/undressing activity did not occur: Refused What is the patient wearing?: Pull over shirt/dress     Pull over shirt/dress - Perfomed by patient: Thread/unthread right sleeve, Thread/unthread left sleeve Pull over shirt/dress - Perfomed by helper: Put head through opening, Pull shirt over trunk        Upper body assist Assist Level: Touching or steadying assistance(Pt > 75%)      Lower Body Dressing/Undressing Lower body dressing   What is the patient wearing?: Shoes, Pants, Socks       Pants- Performed by helper: Thread/unthread right pants leg, Pull pants up/down, Thread/unthread left pants leg       Socks - Performed by helper: Don/doff right sock, Don/doff left sock   Shoes -  Performed by helper: Don/doff right shoe, Don/doff left shoe          Lower body assist Assist for lower body dressing: 2 Helpers      Toileting Toileting Toileting activity did not occur:  (using urinal, no BM)   Toileting steps completed by helper: Adjust clothing prior to toileting, Performs perineal hygiene, Adjust clothing after toileting Toileting Assistive Devices: Other (comment) (stedy)  Toileting assist Assist level:  Two helpers   Transfers Chair/bed transfer   Chair/bed transfer method: Lateral scoot Chair/bed transfer assist level: Maximal assist (Pt 25 - 49%/lift and lower) Chair/bed transfer assistive device: Sliding board, Armrests Mechanical lift: Stedy   Locomotion Ambulation Ambulation activity did not occur: Safety/medical concerns   Max distance: 60 Assist level: 2 helpers   Education administrator activity did not occur: Safety/medical concerns Type: Manual Max wheelchair distance: 30 Assist Level: Touching or steadying assistance (Pt > 75%)  Cognition Comprehension Comprehension assist level: Follows complex conversation/direction with no assist  Expression Expression assist level: Expresses basic 75 - 89% of the time/requires cueing 10 - 24% of the time. Needs helper to occlude trach/needs to repeat words.  Social Interaction Social Interaction assist level: Interacts appropriately with others with medication or extra time (anti-anxiety, antidepressant).  Problem Solving Problem solving assist level: Solves complex problems: With extra time  Memory Memory assist level: Complete Independence: No helper    Medical Problem List and Plan: 1.  Deficits in mobility as well as inability to carry out ADL tasks secondary to cervical myelopathy, hx of ACDF in February 2018.  Cont CIR therapies     -PT, OT, SLP 2.  DVT Prophylaxis/Anticoagulation: Pharmaceutical: Lovenox 3. Pain Management: tylenol prn.  4. Mood: LCSW to follow for evaluation and support. Team to provide ego support.  5. Neuropsych: This patient is capable of making decisions on his own behalf. 6. Skin/Wound Care: Monitor daily. Routine pressure relief measures 7. Fluids/Electrolytes/Nutrition: reasonable PO intake.              BMP WNL 6/20-discussed result with pt 8. HTN: Monitor BP bid. 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 Vitamin B 12 deficiency: on supplement. Last level 6/6 was normal 11. H/O of  Anxiety: Monitor for now--does not want to start any meds. Trazodone at nights prn to help manage anxiety at nights.   Stable. Pt very engaged with team 12.  Constipation cont miralax not requiring a supp for bowel program  LOS (Days) 17 A FACE TO FACE EVALUATION WAS PERFORMED  Erick Colace 07/10/2016 7:49 AM

## 2016-07-10 NOTE — Progress Notes (Signed)
Physical Therapy Note  Patient Details  Name: Steve Koch MRN: [id number] Date of Birth: [birth year] Today's Date: 07/10/2016   Met with pt and wife to discuss change of goals to shift focus to slideboard transfers as gait, standing have not progressed to the point where they will be safe or functional to use at home with only wife present. Also discussed recommendation of hospital bed to increase independence, reduce burden of care- pt and wife to discuss and decide. Pt and wife report understanding; pt frustrated because he feels like he is working very hard. Discussed that pt has done everything therapist's have asked, he has worked very hard in therapy sessions and on his own to continue to progress towards goals, however he is limited by hypertonicity, spasticity, coordination deficits that are beyond his control. Encouraged pt to continue working hard and progressing towards long term goals during remainder of stay and with follow up therapy. Plan for family education Wednesday/thursday to include slideboard transfers w/c <>bed and car transfer.    Benjiman Core Tygielski 07/10/2016, 3:13 PM

## 2016-07-10 NOTE — Progress Notes (Signed)
Physical Therapy Session Note  Patient Details  Name: Steve Koch MRN: [id number] Date of Birth: 06-26-1960  Today's Date: 07/10/2016 PT Individual Time: 0800-0900 PT Individual Time Calculation (min): 60 min   Short Term Goals: Week 3:  PT Short Term Goal 1 (Week 3): =LTG due to estimated LOS; w/c mobility goals d/c, home ambulation goal d/c.  Skilled Therapeutic Interventions/Progress Updates:  Pt received with nursing staff assisting pt with transfer to w/c using stedy. Pt total A to rehab gym. Pt max A for slide board transfer; pt required increased time to perform transfer with multiple anterior weight shifts in order to scoot. Pt tends to lean posteriorly while performing lateral scoot despite multimodal cues for anterior weight shift. Sit>stand x4 with mod A and wedge placed under pt's toes to facilitate anterior tibial translation during sit>stand and stretch gastroc while in standing. Pt requires several attempts before successful sit>stand. Standing balance min/mod A with verbal and tactile cues for anterior pelvic tilt and hip extension. Manual facilitation at pt's ankles to facilitate pronation, as pt demonstrated tendency to supinate feet with weight bearing. Lateral scoots on mat table R/L x5 with mod A and tactile cues for anterior weight shift. Slide board transfer from mat to w/c with max A; pt demonstrate improved weight shift during this transfer and required less time and number of scoots needed to complete transfer. Total A to return to pt's room where he remained seated in w/c with all needs in reach.   Therapy Documentation Precautions:  Precautions Precautions: Fall, Cervical Precaution Comments: no brace required Required Braces or Orthoses: Other Brace/Splint Other Brace/Splint: pt has RUE resting hand splint at night & dynamic hand splint for during the day Restrictions Weight Bearing Restrictions: No   See Function Navigator for Current Functional  Status.   Therapy/Group: Individual Therapy  [name] 07/10/2016, 12:09 PM

## 2016-07-10 NOTE — Progress Notes (Signed)
Recreational Therapy Session Note  Patient Details  Name: Andres ShadCharles Hackel MRN: [id number] Date of Birth: [birth year] Today's Date: 07/10/2016  Pain: no c/o Skilled Therapeutic Interventions/Progress Updates: Session focused on activity tolerance, dynamic sitting balance, BUE use & slide board transfers.  Pt requested to focus on UE use during therapy session.  Pt transported to therapy gym via w/c.  Pt performed slide board transfer to and from therapy mat with max assist.  Seated on mat, pt used BUE with max assist for Zoom ball, cues needed for breathing technique.  Transisted to [employer] 4 using LUE to manipulate game pieces during game play.  Therapy/Group: Co-Treatment  Mallerie Blok 07/10/2016, 1:42 PM

## 2016-07-10 NOTE — Progress Notes (Signed)
Speech Language Pathology Daily Session Note  Patient Details  Name: Steve Koch MRN: [id number] Date of Birth: 07/30/1960  Today's Date: 07/10/2016 SLP Individual Time: 1115-1200 SLP Individual Time Calculation (min): 45 min  Short Term Goals: Week 3: SLP Short Term Goal 1 (Week 3): Pt will achieve intelligibilty at the conversational level with mod I use of increased vocal intensity and overarticulation in a noisey environment.  SLP Short Term Goal 2 (Week 3): Pt will monitor and correct verbal errors at the conversational level with mod I.    Skilled Therapeutic Interventions: Skilled treatment session focused on speech intelligibility goals. SLP facilitated session by education and emotional support as pt appeared fatigued and emotional over current situation. Pt confirmed that he was fatigued. As a possible result, his speech intelligibility was reduced to ~ 75% at the simple conversation level. Pt able to recall and use speech intelligibility strategies, self-monitor and self correct with Mod I but despite this, pt's speech intelligibility didn't improve this session. Support given and strategies given to record himself to monitor speech. Pt was returned to room, left upright in wheelchair with all needs within reach. Continue per current plan of care.      Function:    Cognition Comprehension Comprehension assist level: Follows complex conversation/direction with no assist  Expression   Expression assist level: Expresses basic 75 - 89% of the time/requires cueing 10 - 24% of the time. Needs helper to occlude trach/needs to repeat words.  Social Interaction Social Interaction assist level: Interacts appropriately with others with medication or extra time (anti-anxiety, antidepressant).  Problem Solving Problem solving assist level: Solves complex problems: With extra time  Memory Memory assist level: Complete Independence: No helper    Pain    Therapy/Group: Individual  Therapy  Florenda Watt 07/10/2016, 1:17 PM

## 2016-07-10 NOTE — Progress Notes (Signed)
Occupational Therapy Session Note  Patient Details  Name: Steve Koch MRN: [id number] Date of Birth: Jan 12, 1961  Today's Date: 07/10/2016 OT Individual Time: 1530-1600 OT Individual Time Calculation (min): 30 min    Short Term Goals: Week 3:  OT Short Term Goal 1 (Week 3): STG=LTG secondary to ELOS  Skilled Therapeutic Interventions/Progress Updates:    Upon entering the room, pt seated in wheelchair with wife present in room. OT assisted pt via wheelchair into day room for NMR and focus on L LE. Pt engaged in table top game of trouble board game. Pt requiring AAROM to move L UE fully across board game. Pt able to utilize thumb to turn card over and holding onto small manipulative to move across board with hand over hand assistance as needed. Pt returning to room at end of session with wife remaining. Call bell and all needed items within reach.   Therapy Documentation Precautions:  Precautions Precautions: Fall, Cervical Precaution Comments: no brace required Required Braces or Orthoses: Other Brace/Splint Other Brace/Splint: pt has RUE resting hand splint at night & dynamic hand splint for during the day Restrictions Weight Bearing Restrictions: No  See Function Navigator for Current Functional Status.   Therapy/Group: Individual Therapy  Alen BleacherBradsher, Wenda Vanschaick P 07/10/2016, 4:04 PM

## 2016-07-11 ENCOUNTER — Inpatient Hospital Stay (HOSPITAL_COMMUNITY): Payer: BLUE CROSS/BLUE SHIELD | Admitting: Speech Pathology

## 2016-07-11 ENCOUNTER — Inpatient Hospital Stay (HOSPITAL_COMMUNITY): Payer: Self-pay | Admitting: Physical Therapy

## 2016-07-11 ENCOUNTER — Encounter (HOSPITAL_COMMUNITY): Payer: Self-pay | Admitting: Psychology

## 2016-07-11 ENCOUNTER — Inpatient Hospital Stay (HOSPITAL_COMMUNITY): Payer: BLUE CROSS/BLUE SHIELD

## 2016-07-11 ENCOUNTER — Encounter (HOSPITAL_COMMUNITY): Payer: Self-pay | Admitting: Radiology

## 2016-07-11 ENCOUNTER — Inpatient Hospital Stay (HOSPITAL_COMMUNITY): Payer: BLUE CROSS/BLUE SHIELD | Admitting: *Deleted

## 2016-07-11 MED ORDER — GADOBENATE DIMEGLUMINE 529 MG/ML IV SOLN
20.0000 mL | Freq: Once | INTRAVENOUS | Status: AC | PRN
  Administered 2016-07-11: 20 mL via INTRAVENOUS

## 2016-07-11 MED ORDER — ALPRAZOLAM 0.5 MG PO TABS
1.0000 mg | ORAL_TABLET | Freq: Once | ORAL | Status: AC
  Administered 2016-07-11: 1 mg via ORAL
  Filled 2016-07-11: qty 2

## 2016-07-11 NOTE — Consult Note (Signed)
Neuropsychological Consultation  Patient:  Steve Koch   DOB: [birth year]  MR Number: [id number]  [facility] [address] [id number] [place] [id number] Dept: [phone] Loc: [phone]  Start: 2:45 PM End: 3:45 PM  Provider/Observer:    Arley PhenixJohn Rodenbough, Psy.D.    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  Developed progressive weakness BLE and difficulty walking with sustained fall striking head.  Admitted on 06/20/2016 with mild dysarthria of questionable etiology.   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  Referred for neuropsycholgical consultation due to adjustment issues, anxieyt and coping skill/adaption interventions.  Interventions Strategy:  Cognitive/behavioral psychotherapeutic interventions as well as neuropsychological review and working on coping skills and strategies.  Participation Level:   Active  Participation Quality:  Appropriate and Attentive      Behavioral Observation:  Well Groomed, Alert, and Appropriate.   Current Psychosocial Factors: The patient is getting ready to be discharged home and does have a lot of stress about the continued neurological symptoms that he has. The patient continues to have an inability to walk and continued severe hemiparesis.  Content of Session:   Reviewed current symptoms and worked on Producer, television/film/videobuilding coping skills and strategies and working out a plan for discharge from a standpoint of a Education officer, environmentalneuropsychological perspective.  Current Status:   Reviewed current symptoms and continued work on therapeutic interventions.  Patient Progress:   The patient has reached a general plateau with regard to improvement in his motor functioning. He continues to be dysarthric and this may  have to do with some subcortical and brainstem issues and/or upper spinal cord issues. His expressive language deficits do not appear to be consistent with those typically seen with Broca's aphasia.  Impression/Diagnosis:   Steve Koch is a 56 year old male that had an initial fall at work resulting in weakness in limbs.  Found to have canal stenosis and cord compression c5-c7.  Had surgery on 2/18.  Continued to have issues with walking and arm weakness.  Fall again 06/20/2016 striking head and developing dysarthria.        Diagnosis:    Anxiety disorder, cervical Myelopathy, Spastic quadriparesis        Electronically Signed   _______________________ Arley PhenixJohn Rodenbough, Psy.D.

## 2016-07-11 NOTE — Progress Notes (Signed)
[facility] PHYSICAL MEDICINE & REHABILITATION     PROGRESS NOTE  Subjective/Complaints:  Pt asking about " blood test for ALS", we discussed his negative ACh receptor antibody.    ROS: pt denies nausea, vomiting, diarrhea, cough, shortness of breath or chest pain      Objective: Vital Signs: Blood pressure 128/81, pulse (!) 59, temperature 97.8 F (36.6 C), temperature source Oral, resp. rate 18, height 6\' 1"  (1.854 m), weight 114 kg (251 lb 6.4 oz), SpO2 96 %. No results found. No results for input(s): WBC, HGB, HCT, PLT in the last 72 hours. No results for input(s): NA, K, CL, GLUCOSE, BUN, CREATININE, CALCIUM in the last 72 hours.  Invalid input(s): CO CBG (last 3)  No results for input(s): GLUCAP in the last 72 hours.  Wt Readings from Last 3 Encounters:  07/03/16 114 kg (251 lb 6.4 oz)  06/21/16 116.9 kg (257 lb 11.5 oz)  03/09/16 116.1 kg (256 lb)    Physical Exam:  BP 128/81 (BP Location: Right Arm)   Pulse (!) 59   Temp 97.8 F (36.6 C) (Oral)   Resp 18   Ht 6\' 1"  (1.854 m)   Wt 114 kg (251 lb 6.4 oz)   SpO2 96%   BMI 33.17 kg/m  Constitutional: He appears well-developed and well-nourished.  HENT: Normocephalic and atraumatic. Bruises on face/around eyes Eyes: EOMI. No discharge.  Cardiovascular: RRR without murmur. No JVD  Respiratory: CTA Bilaterally without wheezes or rales. Normal effort    GI: Soft. Bowel sounds are normal.  Musculoskeletal: He exhibits edema RUE. Both heel cords tight. Can't range past -10 degrees at either ankle Neurological: He is alert and oriented.  Dysarthric speech Motor: RUE: 1-2/5 prox to distal.  ---no changes LUE: Shoulder abduction, elbow flexion/extension 4/5, wrist, hand grip 2+/5, contractures of digits 3 and 4.  B/l LE 4-/5 prox to distal.  Resting tone in ue 1/4 and 1+/4 LE's Skin: Skin is warm and dry.  Psychiatric: Normal mood and behavior.  Cognition and memory are not impaired  Assessment/Plan: 1. Functional  deficits secondary to cervical myelopathy which require 3+ hours per day of interdisciplinary therapy in a comprehensive inpatient rehab setting. Physiatrist is providing close team supervision and 24 hour management of active medical problems listed below. Physiatrist and rehab team continue to assess barriers to discharge/monitor patient progress toward functional and medical goals.  Function:  Bathing Bathing position Bathing activity did not occur: Refused Position: Systems developer parts bathed by patient: Abdomen Body parts bathed by helper: Right arm, Left arm, Chest, Front perineal area, Buttocks, Right upper leg, Left upper leg, Right lower leg, Left lower leg, Back  Bathing assist Assist Level: 2 helpers      Upper Body Dressing/Undressing Upper body dressing Upper body dressing/undressing activity did not occur: Refused What is the patient wearing?: Pull over shirt/dress     Pull over shirt/dress - Perfomed by patient: Thread/unthread right sleeve, Thread/unthread left sleeve Pull over shirt/dress - Perfomed by helper: Put head through opening, Pull shirt over trunk        Upper body assist Assist Level: Touching or steadying assistance(Pt > 75%)      Lower Body Dressing/Undressing Lower body dressing   What is the patient wearing?: Shoes, Pants, Socks       Pants- Performed by helper: Thread/unthread right pants leg, Pull pants up/down, Thread/unthread left pants leg       Socks - Performed by helper: Don/doff right sock,  Don/doff left sock   Shoes - Performed by helper: Don/doff right shoe, Don/doff left shoe          Lower body assist Assist for lower body dressing: 2 Helpers      Toileting Toileting Toileting activity did not occur:  (using urinal, no BM)   Toileting steps completed by helper: Adjust clothing prior to toileting, Performs perineal hygiene, Adjust clothing after toileting Toileting Assistive Devices: Other (comment) (stedy)   Toileting assist Assist level: Two helpers   Transfers Chair/bed transfer   Chair/bed transfer method: Lateral scoot Chair/bed transfer assist level: Maximal assist (Pt 25 - 49%/lift and lower) Chair/bed transfer assistive device: Armrests, Sliding board Mechanical lift: Stedy   Locomotion Ambulation Ambulation activity did not occur: Safety/medical concerns   Max distance: 60 Assist level: 2 helpers   Education administratorWheelchair Wheelchair activity did not occur: Safety/medical concerns Type: Manual Max wheelchair distance: 30 Assist Level: Touching or steadying assistance (Pt > 75%)  Cognition Comprehension Comprehension assist level: Follows complex conversation/direction with no assist  Expression Expression assist level: Expresses basic 75 - 89% of the time/requires cueing 10 - 24% of the time. Needs helper to occlude trach/needs to repeat words.  Social Interaction Social Interaction assist level: Interacts appropriately with others with medication or extra time (anti-anxiety, antidepressant).  Problem Solving Problem solving assist level: Solves complex problems: With extra time  Memory Memory assist level: Complete Independence: No helper    Medical Problem List and Plan: 1.  Deficits in mobility as well as inability to carry out ADL tasks secondary to cervical myelopathy, hx of ACDF in February 2018.  Cont CIR therapies     -PT, OT, SLP team conf in am 2.  DVT Prophylaxis/Anticoagulation: Pharmaceutical: Lovenox 3. Pain Management: tylenol prn.  4. Mood: LCSW to follow for evaluation and support. Team to provide ego support.  5. Neuropsych: This patient is capable of making decisions on his own behalf. 6. Skin/Wound Care: Monitor daily. Routine pressure relief measures 7. Fluids/Electrolytes/Nutrition: reasonable PO intake.              BMP WNL 6/20-discussed result with pt 8. HTN: Monitor BP bid. Continue Norvasc.Controlled 6/25   Vitals:   07/10/16 1432 07/11/16 0600  BP:  124/70 128/81  Pulse: 60 (!) 59  Resp: 18 18  Temp: 98.1 F (36.7 C) 97.8 F (36.6 C)   9. 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 Vitamin B 12 deficiency: on supplement. Last level 6/6 was normal 11. H/O of  Anxiety: Monitor for now--does not want to start any meds. Trazodone at nights prn to help manage anxiety at nights.   Stable. Pt very engaged with team 12.  Constipation cont miralax not requiring a supp for bowel program 13.  Dysarthria not explained by cervical myelopathy, no "blood test for ALS other than genetic testing for familial form which is the minority of cases LOS (Days) 18 A FACE TO FACE EVALUATION WAS PERFORMED  Erick ColaceKIRSTEINS,Nayson Traweek E 07/11/2016 7:48 AM

## 2016-07-11 NOTE — Plan of Care (Signed)
Problem: RH Grooming Goal: LTG Patient will perform grooming w/assist,cues/equip (OT) LTG: Patient will perform grooming with assist, with/without cues using equipment (OT)  Downgraded secondary to decreased progress  Problem: RH Dressing Goal: LTG Patient will perform upper body dressing (OT) LTG Patient will perform upper body dressing with assist, with/without cues (OT).  Downgraded secondary to decreased progress  Problem: RH Toileting Goal: LTG Patient will perform toileting w/assist, cues/equip (OT) LTG: Patient will perform toiletiing (clothes management/hygiene) with assist, with/without cues using equipment (OT)  Outcome: Not Applicable Date Met: 43/83/77 Goal discharged secondary to lack of progress  Problem: RH Toilet Transfers Goal: LTG Patient will perform toilet transfers w/assist (OT) LTG: Patient will perform toilet transfers with assist, with/without cues using equipment (OT)  Downgraded secondary to progress  Problem: RH Tub/Shower Transfers Goal: LTG Patient will perform tub/shower transfers w/assist (OT) LTG: Patient will perform tub/shower transfers with assist, with/without cues using equipment (OT)  Downgraded secondary to decreased progress

## 2016-07-11 NOTE — Progress Notes (Signed)
Occupational Therapy Session Note  Patient Details  Name: Steve Koch MRN: [id number] Date of Birth: Jun 21, 1960  Today's Date: 07/11/2016 OT Individual Time: 1000-1100 OT Individual Time Calculation (min): 60 min    Short Term Goals: Week 3:  OT Short Term Goal 1 (Week 3): STG=LTG secondary to ELOS  Skilled Therapeutic Interventions/Progress Updates:    Pt seen for OT intervention with Recreational Therapist.  Focus on continued discharge planning.  Discussed at length bathroom layout and dimensions.  After lengthy discussion, recommended pt initially receive HHOT to problem solve shower and toilet transfers.  Pt in agreement with HHOT and transition to OPOT.  Pt in agreement with recommendation for hospital bed.  Pt became tearful during discussion and expressed disappointment in progress.  Pt stated he was anticipating more independence.  Pt pleased with progress but disappointed that he is unable to complete self care tasks without assistance.  Pt remained in w/c with all needs within reach.   Therapy Documentation Precautions:  Precautions Precautions: Fall, Cervical Precaution Comments: no brace required Required Braces or Orthoses: Other Brace/Splint Other Brace/Splint: pt has RUE resting hand splint at night & dynamic hand splint for during the day Restrictions Weight Bearing Restrictions: No Pain:  Pt denies pain  See Function Navigator for Current Functional Status.   Therapy/Group: Individual Therapy  [name], Darleene Cumpian Chappell 07/11/2016, 12:22 PM

## 2016-07-11 NOTE — Progress Notes (Signed)
Physical Therapy Session Note  Patient Details  Name: Steve Koch MRN: [id number] Date of Birth: 05/16/1960  Today's Date: 07/11/2016 PT Individual Time: 7829-56210857-0928 PT Individual Time Calculation (min): 31 min   Short Term Goals: Week 3:  PT Short Term Goal 1 (Week 3): =LTG due to estimated LOS; w/c mobility goals d/c, home ambulation goal d/c.  Skilled Therapeutic Interventions/Progress Updates:    Pt sitting in w/c upon arrival, agreeable to PT session. Transported via w/c to<>from gym with total assist. Transfers: w/c<>mat table via sliding board and max assist. Verbal and tactile cues for forward weight shifting as well as hand placement. Balance: sitting balance with lateral weight shifting. Following session, pt returned to room with all needs in reach.   Therapy Documentation Precautions:  Precautions Precautions: Fall, Cervical Precaution Comments: no brace required Required Braces or Orthoses: Other Brace/Splint Other Brace/Splint: pt has RUE resting hand splint at night & dynamic hand splint for during the day Restrictions Weight Bearing Restrictions: No  Pain:  Denies pain  See Function Navigator for Current Functional Status.   Therapy/Group: Individual Therapy  Delton SeeBenjamin Lyana Asbill, PT 07/11/2016, 8:59 AM

## 2016-07-11 NOTE — Progress Notes (Signed)
Physical Therapy Session Note  Patient Details  Name: Steve Koch MRN: [id number] Date of Birth: 04-13-1960  Today's Date: 07/11/2016 PT Individual Time: 1300-1400 PT Individual Time Calculation (min): 60 min   Short Term Goals: Week 3:  PT Short Term Goal 1 (Week 3): =LTG due to estimated LOS; w/c mobility goals d/c, home ambulation goal d/c.  Skilled Therapeutic Interventions/Progress Updates:   Pt received seated in manual w/c. Total A to ortho gym. Max/mod A slide board transfer from w/c>car for lateral scoot, board placement, and to lift LEs with education on setup and steps to perform transfer; verbal cues for anterior weight shift. Pt demonstrated improved weight shift and lateral scoot compared to session yesterday. Pt's wife educated on how to assist pt getting out of car; pt performed slide board transfer from car>w/c with max A from pt's wife. Total A to return to pt's room. Slightly uphill slide board transfer from w/c>bed with max A and cues for sequencing of transfer. Max A sit>supine and to reposition in bed. Max A+2 for supine>sit with pt's wife directing activity. Pt and pt's wife with [facility]. Pt left seated on EOB with pt's wife and CSW to discuss preparations for discharge.   Therapy Documentation Precautions:  Precautions Precautions: Fall, Cervical Precaution Comments: no brace required Required Braces or Orthoses: Other Brace/Splint Other Brace/Splint: pt has RUE resting hand splint at night & dynamic hand splint for during the day Restrictions Weight Bearing Restrictions: No   See Function Navigator for Current Functional Status.   Therapy/Group: Individual Therapy  Dorothea GlassmanCaroline Legacy Carrender 07/11/2016, 4:28 PM

## 2016-07-11 NOTE — Progress Notes (Signed)
Speech Language Pathology Daily Session Note  Patient Details  Name: Steve ShadCharles Stengel MRN: [id number] Date of Birth: [birth year]  Today's Date: 07/11/2016 SLP Individual Time: 1545-1630 SLP Individual Time Calculation (min): 45 min  Short Term Goals: Week 3: SLP Short Term Goal 1 (Week 3): Pt will achieve intelligibilty at the conversational level with mod I use of increased vocal intensity and overarticulation in a noisey environment.  SLP Short Term Goal 2 (Week 3): Pt will monitor and correct verbal errors at the conversational level with mod I.    Skilled Therapeutic Interventions: Skilled treatment session focused on speech communication goals. SLP facilitiated session by facilitating barrier game between pt and his wife. Pt able to implement speech intelligibility strategies, self-monitor and self-correct with Mod I to achieve ~ 90% intelligibility at the unstructured multi-sentence level. Pt appeared less fatigued today. Pt was returned to room, left upright in wheelchair with wife and friend present. Continue per current plan of care.      Function:  Eating Eating   Modified Consistency Diet: No Eating Assist Level: Set up assist for   Eating Set Up Assist For: Opening containers;Cutting food       Cognition Comprehension Comprehension assist level: Follows complex conversation/direction with no assist  Expression   Expression assist level: Expresses basic 90% of the time/requires cueing < 10% of the time.  Social Interaction Social Interaction assist level: Interacts appropriately with others with medication or extra time (anti-anxiety, antidepressant).  Problem Solving Problem solving assist level: Solves complex problems: With extra time  Memory Memory assist level: Complete Independence: No helper    Pain    Therapy/Group: Individual Therapy  Dewarren Ledbetter 07/11/2016, 6:15 PM

## 2016-07-11 NOTE — Progress Notes (Signed)
Recreational Therapy Session Note  Patient Details  Name: Steve ShadCharles Kauk MRN: [id number] Date of Birth: Oct 06, 1960 Today's Date: 07/11/2016  Pain: no c/o Skilled Therapeutic Interventions/Progress Updates: Session focused on discharge planning with length discussion on bathroom set up during co-treat with OT.  Session goal was to simulate pt's bathroom set up and practice slide board transfers w/c<->toilet seat<->tub bench.  Once set up and further discussion pt stated agreement that HHOT might be best place to start once discharged from CIR.  Pt tearful throughout session about his current status and limited progress.  Pt states that he wants to be realistic and set things up at home so that he and his wife are safe with all of his care needs.  Pt also agreeable to hospital bed at discharge.  Discussed the benefits of HH therapies with transition to outpatient therapies & additional ways to be safe and save energy at home.  Also discussed an outing to be scheduled tomorrow including purpose & potential goals.  Pt agreeable.  Therapy/Group: Co-Treatment  Lainie Daubert 07/11/2016, 3:41 PM

## 2016-07-12 ENCOUNTER — Inpatient Hospital Stay (HOSPITAL_COMMUNITY): Payer: BLUE CROSS/BLUE SHIELD | Admitting: *Deleted

## 2016-07-12 ENCOUNTER — Inpatient Hospital Stay (HOSPITAL_COMMUNITY): Payer: Self-pay | Admitting: Physical Therapy

## 2016-07-12 ENCOUNTER — Inpatient Hospital Stay (HOSPITAL_COMMUNITY): Payer: Self-pay | Admitting: Speech Pathology

## 2016-07-12 ENCOUNTER — Inpatient Hospital Stay (HOSPITAL_COMMUNITY): Payer: Self-pay | Admitting: *Deleted

## 2016-07-12 DIAGNOSIS — R471 Dysarthria and anarthria: Secondary | ICD-10-CM

## 2016-07-12 LAB — BASIC METABOLIC PANEL
ANION GAP: 8 (ref 5–15)
BUN: 13 mg/dL (ref 6–20)
CALCIUM: 9.5 mg/dL (ref 8.9–10.3)
CO2: 27 mmol/L (ref 22–32)
Chloride: 106 mmol/L (ref 101–111)
Creatinine, Ser: 0.86 mg/dL (ref 0.61–1.24)
GFR calc Af Amer: >60 mL/min (ref >60)
Glucose, Bld: 94 mg/dL (ref 65–99)
POTASSIUM: 4.4 mmol/L (ref 3.5–5.1)
SODIUM: 141 mmol/L (ref 135–145)

## 2016-07-12 LAB — CBC
HEMATOCRIT: 44.1 % (ref 39.0–52.0)
HEMOGLOBIN: 15.7 g/dL (ref 13.0–17.0)
MCH: 30.8 pg (ref 26.0–34.0)
MCHC: 35.6 g/dL (ref 30.0–36.0)
MCV: 86.5 fL (ref 78.0–100.0)
Platelets: 238 10*3/uL (ref 150–400)
RBC: 5.1 MIL/uL (ref 4.22–5.81)
RDW: 14.5 % (ref 11.5–15.5)
WBC: 9.6 10*3/uL (ref 4.0–10.5)

## 2016-07-12 MED ORDER — ALPRAZOLAM 0.5 MG PO TABS
1.0000 mg | ORAL_TABLET | Freq: Once | ORAL | Status: AC
  Administered 2016-07-13: 1 mg via ORAL
  Filled 2016-07-12 (×2): qty 2

## 2016-07-12 NOTE — Consult Note (Signed)
NEURO HOSPITALIST CONSULT NOTE   Requestig physician: Dr. Riley KillSwartz   Reason for Consult: possible ALS   History obtained from:  Patient     HPI:                                                                                                                                          Steve Koch is an 56 y.o. male unfortunate male who recently had ACDF from C5-C7. Since then he's had bilateral lower extremity weakness with bilateral drop foot. Per wife after he had surgery he had had another fall to which she had apparently then noted some dysarthria. Patient had MRI of brain since he has been in the hospital which did show signal abnormality which is consistent with ALS. Neurology was asked to consult for possible ALS. We have talked to [facility] and explained to them that ALS is diagnosed through EMG nerve conduction study and that this would need to be done as an outpatient however neurology was asked to come and talk to the patient's family.  Past Medical History:  Diagnosis Date  . Anxiety    situational with injury  . Carpal tunnel syndrome    right hand  . GERD (gastroesophageal reflux disease)   . Hypertension   . PONV (postoperative nausea and vomiting)     Past Surgical History:  Procedure Laterality Date  . ANTERIOR CERVICAL DECOMP/DISCECTOMY FUSION N/A 03/09/2016   Procedure: Anterior Cervical Discectomy Fusion Cervial 5-7;  Surgeon: Venita Lickahari Brooks, MD;  Location[facility] OR;  Service: Orthopedics;  Laterality: N/A;  Requests for 3.5 hrs  . BACK SURGERY    . COLONOSCOPY    . HAND SURGERY Left   . HERNIA REPAIR Right    inguinal  . KNEE ARTHROSCOPY Right     Family History  Problem Relation Age of Onset  . Pneumonia Mother   . Hypertension Mother   . Diabetes Father   . Cancer Father       Social History:  reports that he quit smoking about 10 years ago. He has never used smokeless tobacco. He reports that he drinks alcohol. He reports that he does  not use drugs.  Allergies  Allergen Reactions  . Lyrica [Pregabalin] Swelling    MEDICATIONS:  Scheduled: . amLODipine  2.5 mg Oral Daily  . aspirin EC  81 mg Oral Daily  . baclofen  10 mg Oral QID  . cyanocobalamin  1,000 mcg Intramuscular Q7 days  . enoxaparin (LOVENOX) injection  40 mg Subcutaneous Q24H  . feeding supplement (PRO-STAT SUGAR FREE 64)  30 mL Oral BID  . fluticasone  1 spray Each Nare Daily  . pantoprazole  40 mg Oral Daily  . vitamin B-12  500 mcg Oral Daily     ROS:                                                                                                                                       History obtained from the patient  General ROS: negative for - chills, fatigue, fever, night sweats, weight gain or weight loss Psychological ROS: negative for - behavioral disorder, hallucinations, memory difficulties, mood swings or suicidal ideation Ophthalmic ROS: negative for - blurry vision, double vision, eye pain or loss of vision ENT ROS: negative for - epistaxis, nasal discharge, oral lesions, sore throat, tinnitus or vertigo Allergy and Immunology ROS: negative for - hives or itchy/watery eyes Hematological and Lymphatic ROS: negative for - bleeding problems, bruising or swollen lymph nodes Endocrine ROS: negative for - galactorrhea, hair pattern changes, polydipsia/polyuria or temperature intolerance Respiratory ROS: negative for - cough, hemoptysis, shortness of breath or wheezing Cardiovascular ROS: negative for - chest pain, dyspnea on exertion, edema or irregular heartbeat Gastrointestinal ROS: negative for - abdominal pain, diarrhea, hematemesis, nausea/vomiting or stool incontinence Genito-Urinary ROS: negative for - dysuria, hematuria, incontinence or urinary frequency/urgency Musculoskeletal ROS: negative for - joint swelling or muscular  weakness Neurological ROS: as noted in HPI Dermatological ROS: negative for rash and skin lesion changes   Blood pressure 110/62, pulse 64, temperature 98.1 F (36.7 C), temperature source Oral, resp. rate 16, height 6\' 1"  (1.854 m), weight 113.3 kg (249 lb 12.8 oz), SpO2 98 %.   Neurologic Examination:                                                                                                      HEENT-  Normocephalic, no lesions, without obvious abnormality.  Normal external eye and conjunctiva.  Normal TM's bilaterally.  Normal auditory canals and external ears. Normal external nose, mucus membranes and septum.  Normal pharynx. Cardiovascular- S1, S2 normal, pulses palpable throughout   Lungs- chest clear, no wheezing, rales, normal symmetric air entry Abdomen- normal findings: bowel sounds normal  Extremities- no edema Lymph-no adenopathy palpable Musculoskeletal-no joint tenderness, deformity or swelling Skin-warm and dry, no hyperpigmentation, vitiligo, or suspicious lesions  Neurological Examination Mental Status: Alert, oriented, thought content appropriate.  Speech dysarthric and bulbar without evidence of aphasia.  Able to follow 3 step commands without difficulty. Cranial Nerves: II: visual fields grossly normal, pupils equal, round, reactive to light and accommodation III,IV, VI: ptosis not present, extraocular muscles extra-ocular motions intact bilaterally V,VII: smile asymmetric on the right, facial light touch sensation normal bilaterally VIII: hearing normal bilaterally IX,X: gag reflex present XI: trapezius strength/neck flexion strength normal bilaterally XII: tongue strength normal  Motor: Right : Upper extremity    Left:     Upper extremity 3/5 deltoid       3/5 deltoid 3/5 tricep      3/5 tricep 4/5 biceps      4/5 biceps  4/5wrist flexion     4/5 wrist flexion 5/5 wrist extension     5/5 wrist extension 5/5 hand grip      5/5 hand grip  Lower  extremity     Lower extremity 3/5 hip flexor      3/5 hip flexor 5/5 hip adductors     5/5 hip adductors 5/5 hip abductors     5/5 hip abductors 5/5 quadricep      5/5 quadriceps  5/5 hamstrings     5/5 hamstrings 5/5 plantar flexion       5/5 plantar flexion 3/5 plantar extension     3/5 plantar extension Tone and bulk:normal tone throughout; no atrophy noted Sensory: Pinprick and light touch intact throughout, bilaterally Deep Tendon Reflexes:  Right: Upper Extremity   Left: Upper extremity   biceps (C-5 to C-6) 2/4   biceps (C-5 to C-6) 2/4 tricep (C7) 2/4    triceps (C7) 2/4 Brachioradialis (C6) 2/4  Brachioradialis (C6) 2/4  Lower Extremity Lower Extremity  quadriceps (L-2 to L-4) 2/4   quadriceps (L-2 to L-4) 2/4 Achilles (S1) 2/4   Achilles (S1) 2/4 Plantars: Upgoing bilaterally       Lab Results: Basic Metabolic Panel:  Recent Labs Lab 07/12/16 0653  NA 141  K 4.4  CL 106  CO2 27  GLUCOSE 94  BUN 13  CREATININE 0.86  CALCIUM 9.5    Liver Function Tests: No results for input(s): AST, ALT, ALKPHOS, BILITOT, PROT, ALBUMIN in the last 168 hours. No results for input(s): LIPASE, AMYLASE in the last 168 hours. No results for input(s): AMMONIA in the last 168 hours.  CBC:  Recent Labs Lab 07/12/16 0653  WBC 9.6  HGB 15.7  HCT 44.1  MCV 86.5  PLT 238    Cardiac Enzymes: No results for input(s): CKTOTAL, CKMB, CKMBINDEX, TROPONINI in the last 168 hours.  Lipid Panel: No results for input(s): CHOL, TRIG, HDL, CHOLHDL, VLDL, LDLCALC in the last 168 hours.  CBG: No results for input(s): GLUCAP in the last 168 hours.  Microbiology: Results for orders placed or performed during the hospital encounter of 06/20/16  Culture, Urine     Status: None   Collection Time: 06/21/16  9:30 AM  Result Value Ref Range Status   Specimen Description URINE, CLEAN CATCH  Final   Special Requests NONE  Final   Culture   Final    NO GROWTH Performed at [facility], [address] [place], [place] [address]    Report Status 06/22/2016 FINAL  Final    Coagulation Studies: No results for input(s): LABPROT,  INR in the last 72 hours.  Imaging: Mr [name] ZO Contrast  Result Date: 07/11/2016 CLINICAL DATA:  56 y/o M; abnormality of gait and mobility. Focus on brainstem, rule out ALS. History of falls and possible traumatic brain injury. EXAM: MRI HEAD WITHOUT AND WITH CONTRAST TECHNIQUE: Multiplanar, multiecho pulse sequences of the brain and surrounding structures were obtained without and with intravenous contrast. CONTRAST:  20mL MULTIHANCE GADOBENATE DIMEGLUMINE 529 MG/ML IV SOLN COMPARISON:  06/20/2016 CT of head. FINDINGS: Brain: Abnormal symmetric increased T2 FLAIR signal is present within the cortical spinal tracts from subcortical white matter to the brainstem. Single additional punctate focus of T2 FLAIR hyperintense signal is present in right frontal subcortical white matter likely representing minimal chronic microvascular ischemic change. The no diffusion restriction to suggest acute or early subacute infarction. With no evidence for cortical contusion. No abnormal susceptibility hypointensity to indicate intracranial hemorrhage or sequelae diffuse axonal injury. No hydrocephalus, extra-axial collection, or focal mass effect. After administration of intravenous contrast there is no abnormal enhancement of the brain. Vascular: Normal flow voids. Skull and upper cervical spine: Normal marrow signal. Sinuses/Orbits: Negative. Other: None. IMPRESSION: 1. Abnormal symmetric increased T2 FLAIR signal within cortical spinal tracts from centrum semiovale to the brainstem. 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 No additional findings to suggest differential considerations of demyelination, metabolic disease, or wallerian degeneration. 2. No MRI findings of traumatic brain injury. 3. No evidence for acute infarct,  hemorrhage, focal mass effect, or abnormal enhancement. Electronically Signed   By: Mitzi Hansen M.D.   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 He is now showing signs of bulbar pathophysiology and fasciculations which is concerning for ALS. Patient has had an MRI which also shows concerns for ALS. At this time our recommendation would be to repeat MRI of brain and cervical spine with contrast.  Patient also needs to be seen by an ALS specialist as far as I know there is no ALS specialist at [facility] thus he would likely have to go to [facility] [place] [facility] [place]. They are he will need a EMG and nerve conduction study to confirm or deny ALS. There are no further tests that can be done while in hospital.

## 2016-07-12 NOTE — Progress Notes (Signed)
SLP Cancellation Note  Patient Details Name: Andres ShadCharles Lorino MRN: [id number] DOB: Oct 04, 1960   Cancelled treatment:        Attempted to see pt for skilled ST therapy session.  Pt with wife at bedside, both of whom appeared very upset and tearful.  Neurology had just been in to review results of MRI with patient.  Patient appeared shocked at the idea that he may have ALS and could not fully engage in conversations with therapist without becoming tearful.  Pt repeatedly told therapist "I don't know" when asked questions.   As a result, therapy session was deferred at this time to allow pt and family time to process information.  Will follow up as appropriate.   Of note, per chart review pt has been supervision-mod I for use of intelligibility strategies.  Given that he is approaching goal level, recommend decreasing ST frequency to 3x/week                                                  Maryjane HurterPage, Lyzette Reinhardt L 07/12/2016, 2:16 PM

## 2016-07-12 NOTE — Progress Notes (Signed)
Social Work Patient ID: Steve Koch, male   DOB: May 03, 1960, 56 y.o.   MRN: [id number]   Met with pt and wife yesterday following team conference.  Both aware that team discussed current progress and did not feel they could justify an extension of LOS. Target d/c date remains for 6/30.  They understand this but admit they are disappointed in limited gains.  Wife admits she is very stressed by level of care he still requires and she will still need to take him to her work site daily in order to continue to provide the 24/7 care he needs.  Discussion about accessibility of doorways at home and at work site and asked PT to follow up with her on this as well to determine best DME.  Also, discussing follow up therapies - hope to be able to set up OP at [facility], however, they need to be appts after 2 pm.  Will continue to follow.  Sharise Lippy, LCSW

## 2016-07-12 NOTE — Progress Notes (Signed)
Occupational Therapy Session Note  Patient Details  Name: Steve ShadCharles Koch MRN: [id number] Date of Birth: August 13, 1960  Today's Date: 07/12/2016 OT Individual Time: 0800-0900 OT Individual Time Calculation (min): 60 min    Short Term Goals: Week 3:  OT Short Term Goal 1 (Week 3): STG=LTG secondary to ELOS  Skilled Therapeutic Interventions/Progress Updates:    Pt asleep in bed upon arrival but easily aroused.  Pt stated he had a MRI at 11 previous night and was awakened at 4 am this morning by construction on hospital addition.  Pt agreeable to shower this morning in anticipation of community outing.  Pt required max A for supine>sit EOB with HOB elevated.  Pt required occasional steady A for sitting balance while Stedy placed for transfer to bathroom.  Pt required mod A for sit>stand from elevated EOB with max tactile cues for forward lean during transition.  Pt able to bathe RUE, abdomen, perineal area, and B upper legs while seated on Stedy in shower.  Pt engaged in dressing tasks with sit<>stand from w/c with Stedy.  Pt was able to thread his LUE into shirt sleeve this morning.  Pt apologetic that he wasn't able to accomplish this morning.  Pt stated he was still drowsy from "valium" administered last night for MRI. MD entered room to discuss results of MRI and informed pt that neurology was being consulted to read images and they would talk to pt later in the day.  Pt became very emotional and tearful after this news.  Emotional support provided. Pt remained in w/c with all needs within reach.  Therapy Documentation Precautions:  Precautions Precautions: Fall, Cervical Precaution Comments: no brace required Required Braces or Orthoses: Other Brace/Splint Other Brace/Splint: pt has RUE resting hand splint at night & dynamic hand splint for during the day Restrictions Weight Bearing Restrictions: No   Pain: Pain Assessment Pain Assessment: No/denies pain Pain Score: 0-No pain  See  Function Navigator for Current Functional Status.   Therapy/Group: Individual Therapy  [name], [name] 07/12/2016, 10:40 AM

## 2016-07-12 NOTE — Progress Notes (Signed)
Recreational Therapy Session Note  Patient Details  Name: Steve Koch MRN: [id number] Date of Birth: Dec 19, 1960 Today's Date: 07/12/2016  Pain: no c/o  Skilled Therapeutic Interventions/Progress Updates:    Outing to [employer] cancelled due to MD's request for pt to stay on rehab to meet with Neurolgy.  Simulated community reintegration in the hospital cafeteria w/c level with Total assist.  Pt performed sit-->stand with +2 assist.  Pt used LUE to get empty cup and for self feeding.  Pt required set up assist for self feeding.  Other goals focused on directing his care, public restroom access, problem solving through potential barriers, energy conservation, discharge planning, &psychosocial support.   Therapy/Group: [facility]   Darleth Eustache 07/12/2016, 8:20 AM

## 2016-07-12 NOTE — Progress Notes (Signed)
Recreational Therapy Discharge Summary Patient Details  Name: Steve Koch MRN: [id number] Date of Birth: 01/13/1961 Today's Date: 07/12/2016  Comments on progress toward goals: Pt is discharging home with wife to provide/coordinate 24 hour care.  Goals focused on activity tolerance, dynamic balance, UE use, compensatory strategies, energy conservation, use of leisure time & discharge planning.  Pt is extremely motivated and willing to try anything to improve.  Goal met for TR Participation.   Reasons for discharge: discharge from hospital  Patient/family agrees with progress made and goals achieved: Yes  SIMPSON,LISA 07/12/2016, 8:24 AM     

## 2016-07-12 NOTE — Progress Notes (Signed)
Steve Koch PHYSICAL MEDICINE & REHABILITATION     PROGRESS NOTE  Subjective/Complaints:  Pt gives hx of developing dysarthria after fall, has cervical myelopathy diagnosis with spastic quadriplegia.  MRI performed focusing on brainstem given dysarthria without cognitive dysfunction Reviewed outpt upper ext EMG/NCV with evidence of mixed acute and chronic denervation  ROS: pt denies nausea, vomiting, diarrhea, cough, shortness of breath or chest pain      Objective: Vital Signs: Blood pressure 110/62, pulse 64, temperature 98.1 F (36.7 C), temperature source Oral, resp. rate 16, height 6\' 1"  (1.854 m), weight 113.3 kg (249 lb 12.8 oz), SpO2 98 %. Mr Steve JeanBrain W Wo Contrast  Result Date: 07/11/2016 CLINICAL DATA:  56 y/o M; abnormality of gait and mobility. Focus on brainstem, rule out ALS. History of falls and possible traumatic brain injury. EXAM: MRI HEAD WITHOUT AND WITH CONTRAST TECHNIQUE: Multiplanar, multiecho pulse sequences of the brain and surrounding structures were obtained without and with intravenous contrast. CONTRAST:  20mL MULTIHANCE GADOBENATE DIMEGLUMINE 529 MG/ML IV SOLN COMPARISON:  06/20/2016 CT of head. FINDINGS: Brain: Abnormal symmetric increased T2 FLAIR signal is present within the cortical spinal tracts from subcortical white matter to the brainstem. Single additional punctate focus of T2 FLAIR hyperintense signal is present in right frontal subcortical white matter likely representing minimal chronic microvascular ischemic change. The no diffusion restriction to suggest acute or early subacute infarction. With no evidence for cortical contusion. No abnormal susceptibility hypointensity to indicate intracranial hemorrhage or sequelae diffuse axonal injury. No hydrocephalus, extra-axial collection, or focal mass effect. After administration of intravenous contrast there is no abnormal enhancement of the brain. Vascular: Normal flow voids. Skull and upper cervical spine:  Normal marrow signal. Sinuses/Orbits: Negative. Other: None. IMPRESSION: 1. Abnormal symmetric increased T2 FLAIR signal within cortical spinal tracts from centrum semiovale to the brainstem. 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 No additional findings to suggest differential considerations of demyelination, metabolic disease, or wallerian degeneration. 2. No MRI findings of traumatic brain injury. 3. No evidence for acute infarct, hemorrhage, focal mass effect, or abnormal enhancement. Electronically Signed   By: Mitzi HansenLance  Furusawa-Stratton M.D.   On: 07/11/2016 23:58    Recent Labs  07/12/16 0653  WBC 9.6  HGB 15.7  HCT 44.1  PLT 238    Recent Labs  07/12/16 0653  NA 141  K 4.4  CL 106  GLUCOSE 94  BUN 13  CREATININE 0.86  CALCIUM 9.5   CBG (last 3)  No results for input(s): GLUCAP in the last 72 hours.  Wt Readings from Last 3 Encounters:  07/12/16 113.3 kg (249 lb 12.8 oz)  06/21/16 116.9 kg (257 lb 11.5 oz)  03/09/16 116.1 kg (256 lb)    Physical Exam:  BP 110/62 (BP Location: Left Arm)   Pulse 64   Temp 98.1 F (36.7 C) (Oral)   Resp 16   Ht 6\' 1"  (1.854 m)   Wt 113.3 kg (249 lb 12.8 oz)   SpO2 98%   BMI 32.96 kg/m  Constitutional: He appears well-developed and well-nourished.  HENT: Normocephalic and atraumatic. Bruises on face/around eyes Eyes: EOMI. No discharge.  Cardiovascular: RRR without murmur. No JVD  Respiratory: CTA Bilaterally without wheezes or rales. Normal effort    GI: Soft. Bowel sounds are normal.  Musculoskeletal: He exhibits edema RUE. Both heel cords tight. Can't range past -10 degrees at either ankle Neurological: He is alert and oriented.  Dysarthric speech Motor: RUE: 1-2/5 prox to distal.  ---  no changes LUE: Shoulder abduction, elbow flexion/extension 4/5, wrist, hand grip 2+/5, contractures of digits 3 and 4.  B/l LE 4-/5 prox to distal.  Resting tone in ue 1/4 and 1+/4 LE's Skin: Skin is warm and  dry.  Psychiatric: Normal mood and behavior.  Cognition and memory are not impaired  Assessment/Plan: 1. Functional deficits secondary to cervical myelopathy which require 3+ hours per day of interdisciplinary therapy in a comprehensive inpatient rehab setting. Physiatrist is providing close team supervision and 24 hour management of active medical problems listed below. Physiatrist and rehab team continue to assess barriers to discharge/monitor patient progress toward functional and medical goals.  Function:  Bathing Bathing position Bathing activity did not occur: Refused Position: Systems developer parts bathed by patient: Abdomen Body parts bathed by helper: Right arm, Left arm, Chest, Front perineal area, Buttocks, Right upper leg, Left upper leg, Right lower leg, Left lower leg, Back  Bathing assist Assist Level: 2 helpers      Upper Body Dressing/Undressing Upper body dressing Upper body dressing/undressing activity did not occur: Refused What is the patient wearing?: Pull over shirt/dress     Pull over shirt/dress - Perfomed by patient: Thread/unthread right sleeve, Thread/unthread left sleeve Pull over shirt/dress - Perfomed by helper: Put head through opening, Pull shirt over trunk        Upper body assist Assist Level: Touching or steadying assistance(Pt > 75%)      Lower Body Dressing/Undressing Lower body dressing   What is the patient wearing?: Shoes, Pants, Socks       Pants- Performed by helper: Thread/unthread right pants leg, Pull pants up/down, Thread/unthread left pants leg       Socks - Performed by helper: Don/doff right sock, Don/doff left sock   Shoes - Performed by helper: Don/doff right shoe, Don/doff left shoe          Lower body assist Assist for lower body dressing: 2 Helpers      Toileting Toileting Toileting activity did not occur:  (using urinal, no BM)   Toileting steps completed by helper: Adjust clothing prior to  toileting, Performs perineal hygiene, Adjust clothing after toileting Toileting Assistive Devices: Other (comment) (stedy)  Toileting assist Assist level: Two helpers   Transfers Chair/bed transfer   Chair/bed transfer method: Lateral scoot Chair/bed transfer assist level: Maximal assist (Pt 25 - 49%/lift and lower) Chair/bed transfer assistive device: Sliding board Mechanical lift: Stedy   Locomotion Ambulation Ambulation activity did not occur: Safety/medical concerns   Max distance: 60 Assist level: 2 helpers   Education administrator activity did not occur: Safety/medical concerns Type: Manual Max wheelchair distance: 30 Assist Level: Touching or steadying assistance (Pt > 75%)  Cognition Comprehension Comprehension assist level: Follows complex conversation/direction with no assist  Expression Expression assist level: Expresses basic 90% of the time/requires cueing < 10% of the time.  Social Interaction Social Interaction assist level: Interacts appropriately with others with medication or extra time (anti-anxiety, antidepressant).  Problem Solving Problem solving assist level: Solves complex problems: With extra time  Memory Memory assist level: Complete Independence: No helper    Medical Problem List and Plan: 1.  Deficits in mobility as well as inability to carry out ADL tasks secondary to cervical myelopathy, hx of ACDF in February 2018.  Cont CIR therapies     -PT, OT, SLP plan for d/c 6/30 2.  DVT Prophylaxis/Anticoagulation: Pharmaceutical: Lovenox 3. Pain Management: tylenol prn.  4. Mood: LCSW to follow for evaluation and  support. Team to provide ego support.  5. Neuropsych: This patient is capable of making decisions on his own behalf. 6. Skin/Wound Care: Monitor daily. Routine pressure relief measures 7. Fluids/Electrolytes/Nutrition: reasonable PO intake.              BMP WNL 6/20-discussed result with pt 8. HTN: Monitor BP bid. Continue Norvasc.Controlled  6/26   Vitals:   07/11/16 1505 07/12/16 0443  BP: 113/74 110/62  Pulse: 66 64  Resp: 17 16  Temp: 98.3 F (36.8 C) 98.1 F (36.7 C)   9. Spastic quadriparesis:   -baclofen to 10mg  6/24, no increase in spasm freq or severity  -using bilateral tension PRAFO's to be worn at night               Cont bracing/ROM efforts otherwise 10. Vitamin B 12 deficiency: on supplement. Last level 6/6 was normal 11. H/O of  Anxiety: Monitor for now--does not want to start any meds. Trazodone at nights prn to help manage anxiety at nights.   Stable. Pt very engaged with team 12.  Constipation cont miralax not requiring a supp for bowel program 13.  Dysarthria not explained by cervical myelopathy,MRI with long tract FLAIR enhancement will ask Neuro to evaluate.  No evidence of BI to explain clinical findings LOS (Days) 19 A FACE TO FACE EVALUATION WAS PERFORMED  Erick Colace 07/12/2016 8:56 AM

## 2016-07-12 NOTE — Patient Care Conference (Signed)
Inpatient RehabilitationTeam Conference and Plan of Care Update Date: 07/11/2016   Time: 2:05 PM    Patient Name: Steve Koch      Medical Record Number: [id number]  Date of Birth: Jul 06, 1960 Sex: Male         Room/Bed: 4W17C/4W17C-01 Payor Info: Payor: [place] / Plan: [employer]BS OTHER / Product Type: *No Product type* /    Admitting Diagnosis: Cervical Myelap  Admit Date/Time:  06/23/2016  5:40 PM Admission Comments: No comment available   Primary Diagnosis:  Spastic quadriparesis ([place]) Principal Problem: Spastic quadriparesis ([place])  Patient Active Problem List   Diagnosis Date Noted  . Benign essential HTN   . Spastic quadriparesis ([place])   . Vitamin B 12 deficiency   . Generalized anxiety disorder   . Hypoalbuminemia due to protein-calorie malnutrition ([place])   . Atrophy of muscle of both hands   . Fall 06/21/2016  . Cervical myelopathy ([place]) 06/21/2016  . Essential hypertension 06/21/2016  . Sensory disturbance 06/21/2016  . Gait instability 06/21/2016  . Hyperreflexia   . Poor tolerance for ambulation   . Myelopathy ([place]) 03/09/2016    Expected Discharge Date: Expected Discharge Date: 07/15/16  Team Members Present: Physician leading conference: Dr. Delice Lesch Social Worker Present: Lennart Pall, LCSW Nurse Present: Dorien Chihuahua, RN PT Present: Kem Parkinson, PT OT Present: Roanna Epley, [place], OT SLP Present: Weston Anna, SLP PPS Coordinator present : Daiva Nakayama, RN, CRRN     Current Status/Progress Goal Weekly Team Focus  Medical   Cervical myelopathy with quadriparesis, dysarthria, question related to concussion or other etiologies  Manage spasticity, avoid side effect of medications  Spasticity management, establish the etiology of dysarthria.   Bowel/Bladder   continent of bladder and bowel  maintain the continence level  remain continent all the time   Swallow/Nutrition/ Hydration   Goal met - regular textures  Goal met       ADL's   bathing-mod A; UB dressing-mod A; LB dressing-tot A; sit<>stand-max A; BUE weakness  bathing-mod A; UB dressing-min A; LB dressing-max A; functional transfers-max A; LTG downgraded  functional transfers, sit<>stand, standing balance, family educaiton, BUE NMR   Mobility   modA bed mobility, mod/maxA slideboard transfers, modA +2 gait with RW  downgraded to minA transfers, gait minA in controlled environment; w/c goals d/c  family training for transfers, bed mobility, lesser focus on standing/gait training   Communication   50 to 75% speech intelligibility at the simple conversation level  intelligible speech at the conversation >90%  self-monitor, self correction of speech at simple conversation level   Safety/Cognition/ Behavioral Observations            Pain   no complains of pain  less<2  monitor and adress it as need it   Skin   mositure to buttocks  no skin breakdown  monitor and adress ir every shift    Rehab Goals Patient on target to meet rehab goals: Yes *See Care Plan and progress notes for long and short-term goals.  Barriers to Discharge: Spastic quadriparesis    Possible Resolutions to Barriers:  Continue rehabilitation    Discharge Planning/Teaching Needs:  Pt to d/c home with wife who can continue to provide 24/7 assistance.  Teaching ongoing with wife.   Team Discussion:  Goals overall changed to sliding board transfers.  No significant functional gains being made and team does not feel extension of stay will be of benefit.  PT has spoken with pt and wife  about changing of focus to w/c and hospital bed level goals.  Planning to complete family ed end of week.  Revisions to Treatment Plan:  Change of some goals.   Continued Need for Acute Rehabilitation Level of Care: The patient requires daily medical management by a physician with specialized training in physical medicine and rehabilitation for the following conditions: Daily direction of a  multidisciplinary physical rehabilitation program to ensure safe treatment while eliciting the highest outcome that is of practical value to the patient.: Yes Daily medical management of patient stability for increased activity during participation in an intensive rehabilitation regime.: Yes Daily analysis of laboratory values and/or radiology reports with any subsequent need for medication adjustment of medical intervention for : Neurological problems;Post surgical problems  Nehemie Casserly 07/12/2016, 11:21 AM

## 2016-07-12 NOTE — Progress Notes (Signed)
Physical Therapy Session Note  Patient Details  Name: Steve Koch MRN: [id number] Date of Birth: Dec 23, 1960  Today's Date: 07/12/2016 PT Individual Time: 1000-1130 PT Individual Time Calculation (min): 90 min   Short Term Goals: Week 3:  PT Short Term Goal 1 (Week 3): =LTG due to estimated LOS; w/c mobility goals d/c, home ambulation goal d/c.  Skilled Therapeutic Interventions/Progress Updates:  Pt received seated in wheelchair and agreeable to participate in therapy. Pt performed slide board transfer with mod A from w/c>mat with pt able to recall correct technique and steps of transfer; verbal and tactile cues for anterior lean during transfer. Pt sit>stand with mod A+2; ambulated with platform walker 60f mod A+2 for management of RW, assist with weight shift prn, and manual facilitation for anterior pelvic tilt. Total A to cafeteria to simulate community environment. Focus on problem solving and functional UE use for self feeding and drinking. Pt performed sit>stand from w/c x3 with +2A at drink machine in cafeteria. Discussed energy conservation and planning techniques for community outings after discharge. Pt returned to room total A where he remained seated in w/c with all needs met.   Therapy Documentation Precautions:  Precautions Precautions: Fall, Cervical Precaution Comments: no brace required Required Braces or Orthoses: Other Brace/Splint Other Brace/Splint: pt has RUE resting hand splint at night & dynamic hand splint for during the day Restrictions Weight Bearing Restrictions: No   See Function Navigator for Current Functional Status.   Therapy/Group: Individual Therapy  CAlysia Penna6/27/2018, 12:34 PM

## 2016-07-13 ENCOUNTER — Inpatient Hospital Stay (HOSPITAL_COMMUNITY): Payer: Self-pay

## 2016-07-13 ENCOUNTER — Inpatient Hospital Stay (HOSPITAL_COMMUNITY): Payer: BLUE CROSS/BLUE SHIELD | Admitting: Occupational Therapy

## 2016-07-13 ENCOUNTER — Inpatient Hospital Stay (HOSPITAL_COMMUNITY): Payer: Self-pay | Admitting: Physical Therapy

## 2016-07-13 ENCOUNTER — Encounter: Payer: Self-pay | Admitting: Neurology

## 2016-07-13 ENCOUNTER — Inpatient Hospital Stay (HOSPITAL_COMMUNITY): Payer: BLUE CROSS/BLUE SHIELD

## 2016-07-13 MED ORDER — ALPRAZOLAM 0.5 MG PO TABS: 1.0000 mg | ORAL_TABLET | Freq: Once | ORAL | Status: DC

## 2016-07-13 MED ORDER — SERTRALINE HCL 50 MG PO TABS
25.0000 mg | ORAL_TABLET | Freq: Every day | ORAL | Status: DC
  Administered 2016-07-13 – 2016-07-15 (×3): 25 mg via ORAL
  Filled 2016-07-13 (×3): qty 1

## 2016-07-13 MED ORDER — GADOBENATE DIMEGLUMINE 529 MG/ML IV SOLN
20.0000 mL | Freq: Once | INTRAVENOUS | Status: AC
  Administered 2016-07-13: 19 mL via INTRAVENOUS

## 2016-07-13 NOTE — Progress Notes (Signed)
Physical Therapy Session Note  Patient Details  Name: Steve Koch MRN: [id number] Date of Birth: [birth year]  Today's Date: 07/13/2016 PT Individual Time: 1500-1530 PT Individual Time Calculation (min): 30 min   Short Term Goals: Week 3:  PT Short Term Goal 1 (Week 3): =LTG due to estimated LOS; w/c mobility goals d/c, home ambulation goal d/c.  Skilled Therapeutic Interventions/Progress Updates: Pt presented in w/c with wife present agreeable to therapy. Transported to rehab gym and observed pt and wife perform SB transfer w/c to/from mat. Provided min cues for SB placement and best positioning for wife to ensure safe body mechanics and improve head/hips relationship. Pt and wife demonstrated improved technique from mat to w/c and indicated feeling better about transfer after practice. Pt and wife returned to room and left with current needs met.      Therapy Documentation Precautions:  Precautions Precautions: Fall, Cervical Precaution Comments: no brace required Required Braces or Orthoses: Other Brace/Splint Other Brace/Splint: pt has RUE resting hand splint at night & dynamic hand splint for during the day Restrictions Weight Bearing Restrictions: No General:   Vital Signs: Therapy Vitals Temp: 98.6 F (37 C) Temp Source: Oral Pulse Rate: 82 Resp: 17 BP: 108/67 Patient Position (if appropriate): Sitting Oxygen Therapy SpO2: 96 % O2 Device: Not Delivered   See Function Navigator for Current Functional Status.   Therapy/Group: Individual Therapy  Bridgitt Raggio  Prestin Munch, PTA  07/13/2016, 3:53 PM

## 2016-07-13 NOTE — Progress Notes (Signed)
[facility]ILITATION     PROGRESS NOTE  Subjective/Complaints:  No problem overnite, discussed result of MRI  ROS: pt denies nausea, vomiting, diarrhea, cough, shortness of breath or chest pain      Objective: Vital Signs: Blood pressure 110/75, pulse 83, temperature 98.6 F (37 C), temperature source Oral, resp. rate 18, height 6\' 1"  (1.854 m), weight 113.3 kg (249 lb 12.8 oz), SpO2 96 %. Mr [name] Wo Contrast  Result Date: 07/11/2016 CLINICAL DATA:  56 y/o M; abnormality of gait and mobility. Focus on brainstem, rule out ALS. History of falls and possible traumatic brain injury. EXAM: MRI HEAD WITHOUT AND WITH CONTRAST TECHNIQUE: Multiplanar, multiecho pulse sequences of the brain and surrounding structures were obtained without and with intravenous contrast. CONTRAST:  20mL MULTIHANCE GADOBENATE DIMEGLUMINE 529 MG/ML IV SOLN COMPARISON:  06/20/2016 CT of head. FINDINGS: Brain: Abnormal symmetric increased T2 FLAIR signal is present within the cortical spinal tracts from subcortical white matter to the brainstem. Single additional punctate focus of T2 FLAIR hyperintense signal is present in right frontal subcortical white matter likely representing minimal chronic microvascular ischemic change. The no diffusion restriction to suggest acute or early subacute infarction. With no evidence for cortical contusion. No abnormal susceptibility hypointensity to indicate intracranial hemorrhage or sequelae diffuse axonal injury. No hydrocephalus, extra-axial collection, or focal mass effect. After administration of intravenous contrast there is no abnormal enhancement of the brain. Vascular: Normal flow voids. Skull and upper cervical spine: Normal marrow signal. Sinuses/Orbits: Negative. Other: None. IMPRESSION: 1. Abnormal symmetric increased T2 FLAIR signal within cortical spinal tracts from centrum semiovale to the brainstem. 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 No additional findings to suggest differential considerations of demyelination, metabolic disease, or wallerian degeneration. 2. No MRI findings of traumatic brain injury. 3. No evidence for acute infarct, hemorrhage, focal mass effect, or abnormal enhancement. Electronically Signed   By: Mitzi Hansen M.D.   On: 07/11/2016 23:58    Recent Labs  07/12/16 0653  WBC 9.6  HGB 15.7  HCT 44.1  PLT 238    Recent Labs  07/12/16 0653  NA 141  K 4.4  CL 106  GLUCOSE 94  BUN 13  CREATININE 0.86  CALCIUM 9.5   CBG (last 3)  No results for input(s): GLUCAP in the last 72 hours.  Wt Readings from Last 3 Encounters:  07/12/16 113.3 kg (249 lb 12.8 oz)  06/21/16 116.9 kg (257 lb 11.5 oz)  03/09/16 116.1 kg (256 lb)    Physical Exam:  BP 110/75 (BP Location: Right Arm)   Pulse 83   Temp 98.6 F (37 C) (Oral)   Resp 18   Ht 6\' 1"  (1.854 m)   Wt 113.3 kg (249 lb 12.8 oz)   SpO2 96%   BMI 32.96 kg/m  Constitutional: He appears well-developed and well-nourished.  HENT: Normocephalic and atraumatic. Bruises on face/around eyes Eyes: EOMI. No discharge.  Cardiovascular: RRR without murmur. No JVD  Respiratory: CTA Bilaterally without wheezes or rales. Normal effort    GI: Soft. Bowel sounds are normal.  Musculoskeletal: He exhibits edema RUE. Both heel cords tight. Can't range past -10 degrees at either ankle Neurological: He is alert and oriented.  Dysarthric speech Motor: RUE: 1-2/5 prox to distal.  ---no changes LUE: Shoulder abduction, elbow flexion/extension 4/5, wrist, hand grip 2+/5, contractures of digits 3 and 4.  B/l LE 4-/5 prox to distal.  Resting tone in ue 1/4  and 1+/4 LE's Skin: Skin is warm and dry.  Psychiatric: Normal mood and behavior.  Cognition and memory are not impaired  Assessment/Plan: 1. Functional deficits secondary to cervical myelopathy which require 3+ hours per day of interdisciplinary therapy in a  comprehensive inpatient rehab setting. Physiatrist is providing close team supervision and 24 hour management of active medical problems listed below. Physiatrist and rehab team continue to assess barriers to discharge/monitor patient progress toward functional and medical goals.  Function:  Bathing Bathing position Bathing activity did not occur: Refused Position: Systems developerhower  Bathing parts Body parts bathed by patient: Abdomen, Right arm, Front perineal area, Right upper leg, Left upper leg Body parts bathed by helper: Left arm, Chest, Buttocks, Right lower leg, Left lower leg, Back  Bathing assist Assist Level: 2 helpers      Upper Body Dressing/Undressing Upper body dressing Upper body dressing/undressing activity did not occur: Refused What is the patient wearing?: Pull over shirt/dress     Pull over shirt/dress - Perfomed by patient: Thread/unthread left sleeve Pull over shirt/dress - Perfomed by helper: Thread/unthread right sleeve, Put head through opening, Pull shirt over trunk        Upper body assist Assist Level: Touching or steadying assistance(Pt > 75%)      Lower Body Dressing/Undressing Lower body dressing   What is the patient wearing?: Shoes, Pants, Ted Hose       Pants- Performed by helper: Thread/unthread right pants leg, Pull pants up/down, Thread/unthread left pants leg       Socks - Performed by helper: Don/doff right sock, Don/doff left sock   Shoes - Performed by helper: Don/doff right shoe, Don/doff left shoe       TED Hose - Performed by helper: Don/doff right TED hose, Don/doff left TED hose  Lower body assist Assist for lower body dressing: 2 Helpers      Toileting Toileting Toileting activity did not occur:  (using urinal, no BM)   Toileting steps completed by helper: Adjust clothing prior to toileting, Performs perineal hygiene, Adjust clothing after toileting Toileting Assistive Devices: Other (comment) (stedy)  Toileting assist Assist  level: Two helpers   Transfers Chair/bed transfer   Chair/bed transfer method: Lateral scoot Chair/bed transfer assist level: Moderate assist (Pt 50 - 74%/lift or lower) Chair/bed transfer assistive device: Sliding board Mechanical lift: Stedy   Locomotion Ambulation Ambulation activity did not occur: Safety/medical concerns   Max distance: 950ft Assist level: 2 helpers   Education administratorWheelchair Wheelchair activity did not occur: Safety/medical concerns Type: Manual Max wheelchair distance: 30 Assist Level: Touching or steadying assistance (Pt > 75%)  Cognition Comprehension Comprehension assist level: Follows complex conversation/direction with no assist  Expression Expression assist level: Expresses basic 75 - 89% of the time/requires cueing 10 - 24% of the time. Needs helper to occlude trach/needs to repeat words.  Social Interaction Social Interaction assist level: Interacts appropriately with others with medication or extra time (anti-anxiety, antidepressant).  Problem Solving Problem solving assist level: Solves complex problems: With extra time  Memory Memory assist level: Complete Independence: No helper    Medical Problem List and Plan: 1.  Deficits in mobility as well as inability to carry out ADL tasks secondary to cervical myelopathy, hx of ACDF in February 2018.  Cont CIR therapies     -PT, OT, SLP plan for d/c 6/30 2.  DVT Prophylaxis/Anticoagulation: Pharmaceutical: Lovenox 3. Pain Management: tylenol prn.  4. Mood: LCSW to follow for evaluation and support. Team to provide ego support.  5. Neuropsych: This patient is capable of making decisions on his own behalf. 6. Skin/Wound Care: Monitor daily. Routine pressure relief measures 7. Fluids/Electrolytes/Nutrition: reasonable PO intake.              BMP WNL 6/20-discussed result with pt 8. HTN: Monitor BP bid. Continue Norvasc.Controlled 6/28   Vitals:   07/12/16 1445 07/13/16 0555  BP: 117/73 110/75  Pulse: 67 83  Resp:  18 18  Temp: 98.6 F (37 C) 98.6 F (37 C)   9. Spastic quadriparesis:   -baclofen to 10mg  6/24, no increase in spasm freq or severity  -using bilateral tension PRAFO's to be worn at night               Cont bracing/ROM efforts otherwise 10. Vitamin B 12 deficiency: on supplement. Last level 6/6 was normal 11. H/O of  Anxiety: Monitor for now--does not want to start any meds. Trazodone at nights prn to help manage anxiety at nights.   Stable. Pt very engaged with team 12.  Constipation cont miralax not requiring a supp for bowel program 13.  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  ALS is clinical diagnosis based on exam and EMG, not a radiologic diagnosis.  referral for OP EMG, local neurologist requested, if + findings in bulbar and LE muscles will need referral to tertiary care motor neuron clinic.  Had prior UE EMG showing diffuse denervation and large motor units of increased duration and reduce recruitment Dr Vela Prose 06/20/16    LOS (Days) 20 A FACE TO FACE EVALUATION WAS PERFORMED  Erick Colace 07/13/2016 8:32 AM

## 2016-07-13 NOTE — Progress Notes (Signed)
Occupational Therapy Session Note  Patient Details  Name: Steve ShadCharles Minkler MRN: [id number] Date of Birth: [birth year]  Today's Date: 07/13/2016 OT Individual Time: 1045-1130 OT Individual Time Calculation (min): 45 min    Short Term Goals: Week 3:  OT Short Term Goal 1 (Week 3): STG=LTG secondary to ELOS  Skilled Therapeutic Interventions/Progress Updates:    Pt resting in bed upon arrival with wife present.  Pt had returned from MRI approx 15 mins prior to therapy session.  Pt agreeable to donning clothing and transferring to w/c.  Pt's wife assisted with dressing tasks and slide board transfer to w/c.  Pt directed care appropriately during transfers, bed mobility, and dressing tasks.  Recommended LB dressing at bed level at this time.  Pt and wife agreed with recommendation. Pt remained in w/c with all needs within reach.   Therapy Documentation Precautions:  Precautions Precautions: Fall, Cervical Precaution Comments: no brace required Required Braces or Orthoses: Other Brace/Splint Other Brace/Splint: pt has RUE resting hand splint at night & dynamic hand splint for during the day Restrictions Weight Bearing Restrictions: No Pain: Pain Assessment Pain Assessment: No/denies pain  See Function Navigator for Current Functional Status.   Therapy/Group: Individual Therapy  [name], Lowry Bala Chappell 07/13/2016, 11:59 AM

## 2016-07-13 NOTE — Progress Notes (Signed)
Physical Therapy Session Note  Patient Details  Name: Andres ShadCharles Knudsen MRN: [id number] Date of Birth: 1960/11/15  Today's Date: 07/13/2016  Short Term Goals: Week 3:  PT Short Term Goal 1 (Week 3): =LTG due to estimated LOS; w/c mobility goals d/c, home ambulation goal d/c.  Skilled Therapeutic Interventions/Progress Updates: Pt missed 60 min d/t off unit for MRI; will attempt to follow up as schedule allows.      Therapy Documentation Precautions:  Precautions Precautions: Fall, Cervical Precaution Comments: no brace required Required Braces or Orthoses: Other Brace/Splint Other Brace/Splint: pt has RUE resting hand splint at night & dynamic hand splint for during the day Restrictions Weight Bearing Restrictions: No General: PT Amount of Missed Time (min): 60 Minutes PT Missed Treatment Reason: CT/MRI   See Function Navigator for Current Functional Status.   Therapy/Group: Individual Therapy  Vista Lawmanlizabeth J Tygielski 07/13/2016, 8:06 AM

## 2016-07-13 NOTE — Progress Notes (Signed)
Occupational Therapy Session Note  Patient Details  Name: Steve ShadCharles Zajicek MRN: [id number] Date of Birth: 1960-05-31  Today's Date: 07/13/2016 OT Individual Time: 1430-1500 OT Individual Time Calculation (min): 30 min    Short Term Goals: Week 3:  OT Short Term Goal 1 (Week 3): STG=LTG secondary to ELOS  Skilled Therapeutic Interventions/Progress Updates:    Treatment session with focus on sit > stand in [place] and education with wife on facilitation of anterior weight shift to decrease burden of care with sit > stand.  Pt required mod assist for sit > stand with various positioning to increase pt initiation and participation and increase wife's body mechanics and decrease burden of care.  Completed sit > stand x8 with fluctuating levels of assist.  Pt's wife demonstrating ability to complete sit > stand with Stedy and manage seat flaps without assist.   Therapy Documentation Precautions:  Precautions Precautions: Fall, Cervical Precaution Comments: no brace required Required Braces or Orthoses: Other Brace/Splint Other Brace/Splint: pt has RUE resting hand splint at night & dynamic hand splint for during the day Restrictions Weight Bearing Restrictions: No General:   Vital Signs: Therapy Vitals Temp: 98.6 F (37 C) Temp Source: Oral Pulse Rate: 82 Resp: 17 BP: 108/67 Patient Position (if appropriate): Sitting Oxygen Therapy SpO2: 96 % O2 Device: Not Delivered Pain:  Pt with no c/o pain  See Function Navigator for Current Functional Status.   Therapy/Group: Individual Therapy  Rosalio LoudHOXIE, Didi Ganaway 07/13/2016, 3:23 PM

## 2016-07-13 NOTE — Progress Notes (Signed)
Occupational Therapy Note  Patient Details  Name: Andres ShadCharles Lightcap MRN: [id number] Date of Birth: 09/24/1960  Today's Date: 07/13/2016 OT Individual Time: 1400-1430 OT Individual Time Calculation (min): 30 min   Pt denies pain Individual Therapy  Pt resting in w/c upon arrival with wife present.  Continued discharge planning and DME recommendations.  Demonstrated drop arm BSC.  Discussed hoyer lift vs Stedy.  Pt and wife have elected to privately purchase a Stedy for use at home.  Pt's wife has practiced use of Stedy with staff.     Lavone NeriLanier, Adena [facility] LLC[facility]appell 07/13/2016, 2:48 PM

## 2016-07-14 ENCOUNTER — Inpatient Hospital Stay (HOSPITAL_COMMUNITY): Payer: Self-pay | Admitting: *Deleted

## 2016-07-14 ENCOUNTER — Inpatient Hospital Stay (HOSPITAL_COMMUNITY): Payer: Self-pay

## 2016-07-14 ENCOUNTER — Inpatient Hospital Stay (HOSPITAL_COMMUNITY): Payer: Self-pay | Admitting: Physical Therapy

## 2016-07-14 DIAGNOSIS — G1222 Progressive bulbar palsy: Secondary | ICD-10-CM

## 2016-07-14 MED ORDER — BACLOFEN 10 MG PO TABS: 10.0000 mg | ORAL_TABLET | Freq: Four times a day (QID) | ORAL | 0 refills | Status: AC

## 2016-07-14 MED ORDER — TRAZODONE HCL 50 MG PO TABS: 50.0000 mg | ORAL_TABLET | Freq: Every evening | ORAL | 0 refills | Status: DC | PRN

## 2016-07-14 MED ORDER — CYANOCOBALAMIN 1000 MCG/ML IJ SOLN: 1000.0000 ug | INTRAMUSCULAR | 0 refills | Status: DC

## 2016-07-14 MED ORDER — CYANOCOBALAMIN 500 MCG PO TABS: 500.0000 ug | ORAL_TABLET | Freq: Every day | ORAL | 0 refills | Status: AC

## 2016-07-14 MED ORDER — FLUTICASONE PROPIONATE 50 MCG/ACT NA SUSP: 1.0000 | Freq: Every day | NASAL | 2 refills | Status: DC

## 2016-07-14 MED ORDER — AMLODIPINE BESYLATE 2.5 MG PO TABS: 2.5000 mg | ORAL_TABLET | Freq: Every day | ORAL | 0 refills | Status: DC

## 2016-07-14 MED ORDER — PANTOPRAZOLE SODIUM 40 MG PO TBEC: 40.0000 mg | DELAYED_RELEASE_TABLET | Freq: Every day | ORAL | 0 refills | Status: DC

## 2016-07-14 NOTE — Progress Notes (Signed)
Occupational Therapy Session Note  Patient Details  Name: Steve Koch MRN: [id number] Date of Birth: [birth year]  Today's Date: 07/14/2016 OT Individual Time: 0830-0900 OT Individual Time Calculation (min): 30 min    Short Term Goals: Week 2:  OT Short Term Goal 1 (Week 2): Pt will perform toilet transfer with max A in order to decrease level of assistance needed with transfer. OT Short Term Goal 1 - Progress (Week 2): Met OT Short Term Goal 2 (Week 2): Pt will perform LB dressing with max A OT Short Term Goal 2 - Progress (Week 2): Progressing toward goal OT Short Term Goal 3 (Week 2): Pt will perform LB clothing management with max A OT Short Term Goal 3 - Progress (Week 2): Progressing toward goal OT Short Term Goal 4 (Week 2): Pt will perform bathing tasks with mod A OT Short Term Goal 4 - Progress (Week 2): Met  Skilled Therapeutic Interventions/Progress Updates:    1:1. Pt tearful upon arrival about potential of investigating further for ALS. OT provided support and encouragement. Focus of session on slideboard transfers w/c<>EOB x2 with +2 available for stability. Pt trasfers with MOD A with VC for trunk flexion and LE management. Exited session with pt seated in w/c with call light in reach and all needs met.   Therapy Documentation Precautions:  Precautions Precautions: Fall, Cervical Precaution Comments: no brace required Required Braces or Orthoses: Other Brace/Splint Other Brace/Splint: pt has RUE resting hand splint at night & dynamic hand splint for during the day Restrictions Weight Bearing Restrictions: No  See Function Navigator for Current Functional Status.   Therapy/Group: Individual Therapy  Tonny Branch 07/14/2016, 10:22 AM

## 2016-07-14 NOTE — Progress Notes (Signed)
[place] PHYSICAL MEDICINE & REHABILITATION     PROGRESS NOTE  Subjective/Complaints:  Pt feels ready to go home in am. No swallowing issues, +dysarthria, no drooling  ROS: pt denies nausea, vomiting, diarrhea, cough, shortness of breath or chest pain      Objective: Vital Signs: Blood pressure 112/67, pulse (!) 59, temperature 98.2 F (36.8 C), temperature source Oral, resp. rate 17, height 6\' 1"  (1.854 m), weight 113.3 kg (249 lb 12.8 oz), SpO2 96 %. Mr [name] Wo Contrast  Result Date: 07/13/2016 CLINICAL DATA:  Bilateral lower extremity weakness with foot drop. Abnormal MR. EXAM: MRI HEAD WITHOUT AND WITH CONTRAST TECHNIQUE: Multiplanar, multiecho pulse sequences of the brain and surrounding structures were obtained without and with intravenous contrast. CONTRAST:  19mL MULTIHANCE GADOBENATE DIMEGLUMINE 529 MG/ML IV SOLN COMPARISON:  07/11/2016 FINDINGS: Brain: Examination again shows abnormal T2 and FLAIR signal affecting the precentral gyri and extending along the course of the corticospinal tracts through the brainstem, suggestive of amyotrophic lateral sclerosis. Elsewhere, the brain appears normal with exception of a small focus of T2 and FLAIR signal in the right caudate head that could be an old small vessel infarction. No evidence of mass lesion, hemorrhage, hydrocephalus or extra-axial collection. After contrast administration, no abnormal brain enhancement occurs. Vascular: Major vessels at the base of the brain show flow. Skull and upper cervical spine: Negative Sinuses/Orbits: Clear/normal Other: None significant IMPRESSION: Abnormal T2 and FLAIR signal affecting the precentral gyri and both corticospinal tracts in a symmetric fashion, essentially diagnostic of amyotrophic lateral sclerosis. The brain otherwise appears normal with exception of a small lacunar infarction in the right caudate head. Electronically Signed   By: Paulina Fusi M.D.   On: 07/13/2016 10:42   Mr Cervical  Spine W Wo Contrast  Result Date: 07/13/2016 CLINICAL DATA:  Recent cervical ACDF. Bilateral lower extremity weakness and foot drop since then. Abnormal exam 07/11/2016. EXAM: MRI CERVICAL SPINE WITHOUT AND WITH CONTRAST TECHNIQUE: Multiplanar and multiecho pulse sequences of the cervical spine, to include the craniocervical junction and cervicothoracic junction, were obtained without and with intravenous contrast. CONTRAST:  19mL MULTIHANCE GADOBENATE DIMEGLUMINE 529 MG/ML IV SOLN COMPARISON:  07/11/2016.  06/20/2016. FINDINGS: Alignment: Normal Vertebrae: Previous ACDF C5-C7.  No complicating feature seen. Cord: No cord compression or primary cord lesion. Artifact in the fusion segment. Posterior Fossa, vertebral arteries, paraspinal tissues: See results of brain exam. Disc levels: Previous ACDF C5-C7. Satisfactory appearance at those levels allowing for the artifact. Small right-sided osteophytes at C5-6 but without likely significant encroachment upon the neural spaces. No abnormality seen from the skullbase to C5 other than mild facet osteoarthritis on the left at C3-4. C7-T1 shows mild facet osteoarthritis but no stenosis of the canal or foramina. IMPRESSION: Previous ACDF C5-C7. Small osteophytes on the right at the C5-6 level with mild narrowing of the canal, but no likely significant stenosis. No significant finding at the other cervical levels. Electronically Signed   By: Paulina Fusi M.D.   On: 07/13/2016 10:36    Recent Labs  07/12/16 0653  WBC 9.6  HGB 15.7  HCT 44.1  PLT 238    Recent Labs  07/12/16 0653  NA 141  K 4.4  CL 106  GLUCOSE 94  BUN 13  CREATININE 0.86  CALCIUM 9.5   CBG (last 3)  No results for input(s): GLUCAP in the last 72 hours.  Wt Readings from Last 3 Encounters:  07/12/16 113.3 kg (249 lb 12.8 oz)  06/21/16 116.9  kg (257 lb 11.5 oz)  03/09/16 116.1 kg (256 lb)    Physical Exam:  BP 112/67 (BP Location: Left Arm)   Pulse (!) 59   Temp 98.2 F (36.8  C) (Oral)   Resp 17   Ht 6\' 1"  (1.854 m)   Wt 113.3 kg (249 lb 12.8 oz)   SpO2 96%   BMI 32.96 kg/m  Constitutional: He appears well-developed and well-nourished.  HENT: Normocephalic and atraumatic. Bruises on face/around eyes Eyes: EOMI. No discharge.  Cardiovascular: RRR without murmur. No JVD  Respiratory: CTA Bilaterally without wheezes or rales. Normal effort    GI: Soft. Bowel sounds are normal.  Musculoskeletal: He exhibits edema RUE. Both heel cords tight. Can't range past -10 degrees at either ankle Neurological: He is alert and oriented.  Dysarthric speech Motor: RUE: 1-2/5 prox to distal.  ---no changes LUE: Shoulder abduction, elbow flexion/extension 4/5, wrist, hand grip 2+/5,  B/l LE 4-/5 prox to distal.  Resting tone in ue 1/4 and 1+/4 LE's Skin: Skin is warm and dry.  Psychiatric: Normal mood and behavior.  Cognition and memory are not impaired  Assessment/Plan: 1. Functional deficits secondary to cervical myelopathy which require 3+ hours per day of interdisciplinary therapy in a comprehensive inpatient rehab setting. Physiatrist is providing close team supervision and 24 hour management of active medical problems listed below. Physiatrist and rehab team continue to assess barriers to discharge/monitor patient progress toward functional and medical goals.  Function:  Bathing Bathing position Bathing activity did not occur: Refused Position: Systems developer parts bathed by patient: Abdomen, Right arm, Front perineal area, Right upper leg, Left upper leg Body parts bathed by helper: Left arm, Chest, Buttocks, Right lower leg, Left lower leg, Back  Bathing assist Assist Level: 2 helpers      Upper Body Dressing/Undressing Upper body dressing Upper body dressing/undressing activity did not occur: Refused What is the patient wearing?: Pull over shirt/dress     Pull over shirt/dress - Perfomed by patient: Thread/unthread left sleeve Pull over  shirt/dress - Perfomed by helper: Thread/unthread right sleeve, Put head through opening, Pull shirt over trunk        Upper body assist Assist Level: Touching or steadying assistance(Pt > 75%)      Lower Body Dressing/Undressing Lower body dressing   What is the patient wearing?: Shoes, Pants, Ted Hose       Pants- Performed by helper: Thread/unthread right pants leg, Pull pants up/down, Thread/unthread left pants leg       Socks - Performed by helper: Don/doff right sock, Don/doff left sock   Shoes - Performed by helper: Don/doff right shoe, Don/doff left shoe       TED Hose - Performed by helper: Don/doff right TED hose, Don/doff left TED hose  Lower body assist Assist for lower body dressing: 2 Helpers      Toileting Toileting Toileting activity did not occur:  (using urinal, no BM)   Toileting steps completed by helper: Adjust clothing prior to toileting, Performs perineal hygiene, Adjust clothing after toileting Toileting Assistive Devices: Other (comment) (stedy)  Toileting assist Assist level: Two helpers   Transfers Chair/bed transfer   Chair/bed transfer method: Lateral scoot Chair/bed transfer assist level: Moderate assist (Pt 50 - 74%/lift or lower) Chair/bed transfer assistive device: Sliding board Mechanical lift: Stedy   Locomotion Ambulation Ambulation activity did not occur: Safety/medical concerns   Max distance: 74ft Assist level: 2 helpers   Education administrator activity did not occur: Safety/medical  concerns Type: Manual Max wheelchair distance: 30 Assist Level: Touching or steadying assistance (Pt > 75%)  Cognition Comprehension Comprehension assist level: Follows complex conversation/direction with no assist  Expression Expression assist level: Expresses basic 75 - 89% of the time/requires cueing 10 - 24% of the time. Needs helper to occlude trach/needs to repeat words.  Social Interaction Social Interaction assist level: Interacts  appropriately with others with medication or extra time (anti-anxiety, antidepressant).  Problem Solving Problem solving assist level: Solves complex problems: With extra time  Memory Memory assist level: Complete Independence: No helper    Medical Problem List and Plan: 1.  Deficits in mobility as well as inability to carry out ADL tasks secondary to cervical myelopathy, hx of ACDF in February 2018.  Cont CIR therapies     -PT, OT, SLP plan for d/c 6/30 2.  DVT Prophylaxis/Anticoagulation: Pharmaceutical: Lovenox 3. Pain Management: tylenol prn.  4. Mood: LCSW to follow for evaluation and support. Team to provide ego support.  5. Neuropsych: This patient is capable of making decisions on his own behalf. 6. Skin/Wound Care: Monitor daily. Routine pressure relief measures 7. Fluids/Electrolytes/Nutrition: reasonable PO intake.              BMP WNL 6/20-discussed result with pt 8. HTN: Monitor BP bid. Continue Norvasc.Controlled 6/28   Vitals:   07/13/16 1430 07/14/16 0612  BP: 108/67 112/67  Pulse: 82 (!) 59  Resp: 17 17  Temp: 98.6 F (37 C) 98.2 F (36.8 C)   9. Spastic quadriparesis:   -baclofen to 10mg  6/24, no increase in spasm freq or severity  -using bilateral tension PRAFO's to be worn at night               Cont bracing/ROM efforts otherwise 10. Vitamin B 12 deficiency: on supplement. Last level 6/6 was normal 11. H/O of  Anxiety: Monitor for now--does not want to start any meds. Trazodone at nights prn to help manage anxiety at nights.   Stable. Pt very engaged with team 12.  Constipation cont miralax not requiring a supp for bowel program 13.  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  ALS is clinical diagnosis based on exam and EMG, not a radiologic diagnosis.  referral for OP EMG, local neurologist requested, if + findings in bulbar and LE muscles will need referral to tertiary care motor neuron  clinic.  Had prior UE EMG showing diffuse denervation and large motor units of increased duration and reduce recruitment Dr Vela ProseLewitt 06/20/16   Has appt with Dr Nita Sickleonika Patel next wk for EMG LOS (Days) 21 A FACE TO FACE EVALUATION WAS PERFORMED  Erick ColaceKIRSTEINS,ANDREW E 07/14/2016 7:51 AM

## 2016-07-14 NOTE — Discharge Instructions (Signed)
Inpatient Rehab Discharge Instructions  Steve Koch Discharge date and time:  07/15/16  Activities/Precautions/ Functional Status: Activity: no lifting, driving, or strenuous exercise for till cleared by MD Diet: regular diet Wound Care: keep wound clean and dry   Functional status:  ___ No restrictions     ___ Walk up steps independently _X__ 24/7 supervision/assistance   ___ Walk up steps with assistance ___ Intermittent supervision/assistance  ___ Bathe/dress independently ___ Walk with therapy only    _X__ Bathe/dress with assistance ___ Walk Independently    ___ Shower independently ___ Walk with assistance    ___ Shower with assistance _X__ No alcohol     ___ Return to work/school ________    COMMUNITY REFERRALS UPON DISCHARGE:    Outpatient: PT     OT   ST                   Agency:  [facility]     Phone:  [phone]                Appointment Date/Time:  07/18/16 @ 11:00 (please arrive 10:30am) - OT                                                                   07/25/16  @ 2:45 - 4:15 pm - PT and ST  Medical Equipment/Items Ordered:  Wheelchair, hospital bed, drop arm commode, tub bench, platform attachments for walker and 30" transfer board                                                      Agency/Supplier:  [facility] @ [phone]      Special Instructions: 1. Continue home exercise plan.    My questions have been answered and I understand these instructions. I will adhere to these goals and the provided educational materials after my discharge from the hospital.  Patient/Caregiver Signature _______________________________ Date __________  Clinician Signature _______________________________________ Date __________  Please bring this form and your medication list with you to all your follow-up doctor's appointments.

## 2016-07-14 NOTE — Progress Notes (Signed)
Speech Language Pathology Discharge Summary  Patient Details  Name: Steve Koch MRN: [id number] Date of Birth: 08-14-1960  Today's Date: 07/14/2016 SLP Individual Time: 0715-0730 SLP Individual Time Calculation (min): 15 min   Skilled Therapeutic Interventions:  Skilled treatment session focused on completing education targeting use of speech intelligibility strategies within unstructured settings such as home. Pt able to recall speech intelligilibity strategies with Mod I and he independently self-monitors and self-corrects his speech. Pt with increased emotional state in setting of recent ALS work-up and as a result, he speech intelligibility has decreased. However, pt compensates with Mod I self correction. All education complete and recommend follow up with Outpatient ST.    Patient has met 5 of 5 long term goals.  Patient to discharge at overall Modified Independent level.   Clinical Impression/Discharge Summary:   Pt has made good progress in ST sessions and as a result he is able to self-monitor and self-correct moments of decreased intelligibility. Pt with recent decline in intelligibility d/t emotional and physical fatigue. Recommend follow-up ST to address prognosis for speech intelligibility after testing for ALS is completed. All education completed.    Care Partner:  Caregiver Able to Provide Assistance: Yes  Type of Caregiver Assistance: Physical  Recommendation:  Outpatient SLP  Rationale for SLP Follow Up: Maximize functional communication   Equipment:     Reasons for discharge: Treatment goals met   Patient/Family Agrees with Progress Made and Goals Achieved: Yes   Function:    Cognition Comprehension Comprehension assist level: Follows complex conversation/direction with no assist  Expression   Expression assist level: Expresses basic 75 - 89% of the time/requires cueing 10 - 24% of the time. Needs helper to occlude trach/needs to repeat words.  Social  Interaction Social Interaction assist level: Interacts appropriately with others with medication or extra time (anti-anxiety, antidepressant).  Problem Solving Problem solving assist level: Solves complex problems: With extra time  Memory Memory assist level: Complete Independence: No helper    Steve Koch B. Rutherford Nail, M.S., CCC-SLP Speech-Language Pathologist  Falisha Osment 07/14/2016, 8:54 AM

## 2016-07-14 NOTE — Progress Notes (Signed)
Occupational Therapy Note  Patient Details  Name: Steve ShadCharles Copley MRN: [id number] Date of Birth: 06/28/1960  Today's Date: 07/14/2016 OT Individual Time: 1400-1425 OT Individual Time Calculation (min): 25 min   Pt denied pain Individual Therapy  Pt resting in w/c upon arrival.  Focus on continued discharge planning, HEP, DME needs, and home safety recommendations.  Pt continues to exhibit sporadic periods of tearfulness due to pending diagnosis. Emotional support provided. Pt remained in w/c with all needs within reach.    Lavone NeriLanier, Marilu Rylander [facility] 07/14/2016, 2:36 PM

## 2016-07-14 NOTE — Progress Notes (Signed)
Occupational Therapy Discharge Summary  Patient Details  Name: Tamarick Kovalcik MRN: [id number] Date of Birth: 22-Oct-1960   Patient has met 10 of 10 long term goals due to improved activity tolerance, improved balance, postural control and ability to compensate for deficits.  Pt progress with BADLs was slow and minimal during this admission.  Pt continues to exhibit significant BUE weakness and limited AROM.  Pt is able to assist with bathing tasks (mod A) and UB dressing tasks (mod A).  Pt is able to self feed with finger foods with setup.  Pt required max A/mod A for sit<>stand with extra time to initiate.  Pt performs slide board transfers with mod A/max A. Pt is acquiring a Stedy to use at home for transfers to [facility] due to w/c will not fit into bathroom.  Pt's wife has been present for therapy and actively participated in [place] and transfers.  Patient to discharge at overall Mod Assist-max assist level.  Patient's care partner is independent to provide the necessary physical assistance at discharge.      Recommendation:  Patient will benefit from ongoing skilled OT services in outpatient setting to continue to advance functional skills in the area of BADL and Reduce care partner burden.  Equipment: drop arm BSC  Reasons for discharge: treatment goals met and discharge from hospital  Patient/family agrees with progress made and goals achieved: Yes  Skilled Therapeutic Intervention  Pt seen for bathe and dress session at shower level in stedy. Pt sit to stand with MOD A for lifting and A to place LUE on bar.  Pt bathes seated on stedy flaps washing LUE, chest, stomach, peri area and tops of BLE. Pt dons pants with TOTAL A and min A to stand from elevated stedy flaps for balance and VC to extend hips. Pt able to thread LUE when donning shirt, but require MAX A overall.OT dons ted hose and non slip socks with TOTAL A.  Exited session with pt seated in w/c with call light in reach and all needs  met.  OT Discharge Vision Baseline Vision/History: No visual deficits Patient Visual Report: No change from baseline Vision Assessment?: No apparent visual deficits Cognition Overall Cognitive Status: Within Functional Limits for tasks assessed Arousal/Alertness: Awake/alert Orientation Level: Oriented X4 Attention: Sustained Sustained Attention: Appears intact Memory: Appears intact Awareness: Appears intact Problem Solving: Appears intact Safety/Judgment: Appears intact Sensation Sensation Light Touch: Appears Intact Stereognosis: Not tested Hot/Cold: Appears Intact Proprioception: Not tested Coordination Gross Motor Movements are Fluid and Coordinated: No Fine Motor Movements are Fluid and Coordinated: No Trunk/Postural Assessment  Postural Control Postural Control: Deficits on evaluation  Balance Static Sitting Balance Static Sitting - Balance Support: No upper extremity supported Static Sitting - Level of Assistance: 5: Stand by assistance Extremity/Trunk Assessment RUE Assessment RUE Assessment: Exceptions to WFL (AROM 45 degrees at shoulder, elbow AROM WFL) LUE Assessment LUE Assessment: Exceptions to [facility] (2+/5 at shoulder, elbow AROM WFL)   See Function Navigator for Current Functional Status.  Leotis Shames [facility] 07/14/2016, 1:35 PM

## 2016-07-14 NOTE — Progress Notes (Signed)
Physical Therapy Discharge Summary  Patient Details  Name: Steve Koch MRN: [id number] Date of Birth: March 26, 1960  Today's Date: 07/14/2016 PT Individual Time: 1430-1530 PT Individual Time Calculation (min): 60 min    Patient has met 1 of 6 long term goals due to improved activity tolerance, improved balance, improved postural control, increased strength, decreased pain, ability to compensate for deficits, functional use of  right lower extremity and left lower extremity and improved coordination.  Patient to discharge at a wheelchair level [place].   Patient's care partner is independent to provide the necessary physical assistance at discharge.  Reasons goals not met: Unmet goals adequate for discharge; pt requires variable min>modA for transfers, modA for bed mobility, continues to require +2 for gait training for safety to manage RW. Wife has demonstrated ability to provide modA for bed mobility and transfers.   Recommendation:  Patient will benefit from ongoing skilled PT services in outpatient setting to continue to advance safe functional mobility, address ongoing impairments in strength, coordination, activity tolerance, ROM, and minimize fall risk.  Equipment: Platform for [employer], sliding board, w/c  Reasons for discharge: treatment goals met and discharge from hospital  Patient/family agrees with progress made and goals achieved: Yes  PT Discharge Precautions/Restrictions  Fall Vital Signs Therapy Vitals Temp: 98.2 F (36.8 C) Temp Source: Oral Pulse Rate: 66 Resp: 18 BP: 113/68 Patient Position (if appropriate): Sitting Oxygen Therapy SpO2: 97 % O2 Device: Not Delivered Vision/Perception    WFL Cognition Overall Cognitive Status: Within Functional Limits for tasks assessed Arousal/Alertness: Awake/alert Orientation Level: Oriented X4 Attention: Sustained Sustained Attention: Appears intact Memory: Appears intact Awareness: Appears intact Problem Solving:  Appears intact Safety/Judgment: Appears intact Sensation Sensation Light Touch: Appears Intact Stereognosis: Not tested Hot/Cold: Appears Intact Proprioception: Not tested Coordination Gross Motor Movements are Fluid and Coordinated: No Fine Motor Movements are Fluid and Coordinated: No Motor  Motor Motor: Abnormal tone Motor - Discharge Observations: Quadriparesis; hypertonicity throughout BUEs/BLEs  Mobility Bed Mobility Bed Mobility: Sit to Supine;Sit to Sidelying Left Sit to Supine: 3: Mod assist;HOB elevated Sit to Supine - Details: Verbal cues for sequencing;Verbal cues for technique;Tactile cues for weight shifting;Tactile cues for placement Sit to Sidelying Right: 3: Mod assist;HOB elevated Sit to Sidelying Right Details: Verbal cues for technique;Verbal cues for precautions/safety;Tactile cues for weight shifting;Tactile cues for placement Transfers Transfers: Yes Sit to Stand: 3: Mod assist Sit to Stand Details (indicate cue type and reason): LUE over therapist's shoulder; facilitating anterior weight shift with pt able to power up through legs without assist beyond maintaining weight shift Stand to Sit: 3: Mod assist Stand to Sit Details (indicate cue type and reason): Verbal cues for sequencing;Verbal cues for technique Stand to Sit Details: cues for anterior weight shift, trunk flexion to assist eccentric control Stand Pivot Transfers: 3: Mod assist;1: +2 Total assist Stand Pivot Transfer Details: Verbal cues for technique;Verbal cues for precautions/safety Stand Pivot Transfer Details (indicate cue type and reason): second person to manage RW Lateral/Scoot Transfers: 3: Mod assist;With slide board;With armrests removed Lateral/Scoot Transfer Details: Verbal cues for precautions/safety;Tactile cues for weight shifting;Tactile cues for placement;Verbal cues for sequencing;Verbal cues for technique Transfer via Lift Equipment: Medical sales representative Ambulation:  Yes Ambulation/Gait Assistance: 1: +2 Total assist;3: Mod assist Ambulation Distance (Feet): 50 Feet Assistive device: Bilateral platform walker Ambulation/Gait Assistance Details: Verbal cues for sequencing;Verbal cues for gait pattern;Manual facilitation for weight shifting;Verbal cues for precautions/safety;Verbal cues for technique;Tactile cues for weight shifting;Tactile cues for sequencing;Tactile cues for  posture;Tactile cues for initiation Ambulation/Gait Assistance Details: one person assist for managing RW, second person facilitating weight shifting, anterior pelvic tilt, preventing posterior LOB Gait Gait: Yes Gait Pattern: Impaired Gait Pattern: Poor foot clearance - left;Poor foot clearance - right;Wide base of support;Decreased stride length;Right foot flat;Left foot flat;Decreased weight shift to left;Decreased hip/knee flexion - left;Decreased hip/knee flexion - right Gait velocity: reduced Stairs / Additional Locomotion Stairs: No Wheelchair Mobility Wheelchair Mobility: Yes Wheelchair Assistance: 3: Building surveyor Details: Verbal cues for Marketing executive: Both lower extermities Wheelchair Parts Management: Needs assistance Distance: up to 50'; slow speed non-functional for home environment  Trunk/Postural Assessment  Cervical Assessment Cervical Assessment: Within Functional Limits Thoracic Assessment Thoracic Assessment: Within Functional Limits Lumbar Assessment Lumbar Assessment: Exceptions to [facility] (posterior pelvic tilt, reduced lumbar lordosis; impaired lumbopelvic dissociation)  Balance Balance Balance Assessed: Yes Static Sitting Balance Static Sitting - Balance Support: No upper extremity supported Static Sitting - Level of Assistance: 5: Stand by assistance Dynamic Sitting Balance Dynamic Sitting - Balance Support: Feet supported;No upper extremity supported Dynamic Sitting - Level of Assistance: 5: Stand by  assistance Dynamic Sitting - Balance Activities: Lateral lean/weight shifting;Forward lean/weight shifting;Reaching for objects;Reaching across midline Static Standing Balance Static Standing - Balance Support: Bilateral upper extremity supported Static Standing - Level of Assistance: 4: Min assist;5: Stand by assistance Dynamic Standing Balance Dynamic Standing - Balance Support: Bilateral upper extremity supported;During functional activity Dynamic Standing - Level of Assistance: 3: Mod assist Extremity Assessment  RUE Assessment RUE Assessment: Exceptions to WFL (AROM 45 degrees at shoulder, elbow AROM WFL) LUE Assessment LUE Assessment: Exceptions to [facility] (2+/5 at shoulder, elbow AROM WFL) RLE Assessment RLE Assessment: Exceptions to [facility] RLE Strength RLE Overall Strength: Deficits Right Hip Flexion: 4-/5 Right Knee Flexion: 4/5 Right Knee Extension: 4+/5 Right Ankle Dorsiflexion: 2+/5 Right Ankle Plantar Flexion: 4+/5 RLE Tone RLE Tone: Moderate;Hypertonic RLE Tone Comments: sustained clonus in soleus LLE Assessment LLE Assessment: Exceptions to [facility] LLE Strength LLE Overall Strength: Deficits Left Hip Flexion: 4-/5 Left Knee Flexion: 4-/5 Left Knee Extension: 4+/5 Left Ankle Dorsiflexion: 3-/5 Left Ankle Plantar Flexion: 4+/5 LLE Tone LLE Tone: Moderate;Hypertonic LLE Tone Comments: sustained clonus in soleus  Skilled Therapeutic Intervention: Pt received seated in w/c, denies pain and agreeable to treatment. Session with focus on assessing mobility as described above, and practicing car transfer to pt's car with wife present. Performed transfer with modA and increased time using slideboard and min cueing from therapist for technique. Pt and wife with no further questions regarding d/c home at this time; will continue follow up PT outpatient and recommend wife not ambulate with pt at this time as he requires +2 for safety to manage RW and facilitate postural alignment and  weight shifting.     See Function Navigator for Current Functional Status.  Benjiman Core Tygielski 07/14/2016, 3:40 PM

## 2016-07-14 NOTE — Progress Notes (Signed)
Occupational Therapy Session Note  Patient Details  Name: Steve Koch MRN: [id number] Date of Birth: 1961-01-09  Today's Date: 07/14/2016 OT Individual Time: [id number] OT Individual Time Calculation (min): 56 min    Short Term Goals: Week 3:  OT Short Term Goal 1 (Week 3): STG=LTG secondary to ELOS  Skilled Therapeutic Interventions/Progress Updates:    Pt initially engaged in functional amb with PFRW with min A +2 (equipment management). Pt returned to room.  Pt expressed desire to engaged in bathing tasks at shower level.  Pt became tearful and voiced anxiety/fears regarding possible diagnosis of ALS.  Emotional support provided.  Pt stated he wanted eat breakfast and tray placed in front.  Pt able to self feed after setup.  Pt completed grooming tasks seated in w/c at sink.  Pt remained in w/c with all needs within reach.  Pt stated he may shower in later OT session.   Therapy Documentation Precautions:  Precautions Precautions: Fall, Cervical Precaution Comments: no brace required Required Braces or Orthoses: Other Brace/Splint Other Brace/Splint: pt has RUE resting hand splint at night & dynamic hand splint for during the day Restrictions Weight Bearing Restrictions: No Pain: Pain Assessment Pain Assessment: No/denies pain  See Function Navigator for Current Functional Status.   Therapy/Group: Individual Therapy  [name], Diasia Henken Chappell 07/14/2016, 10:09 AM

## 2016-07-15 DIAGNOSIS — R471 Dysarthria and anarthria: Secondary | ICD-10-CM

## 2016-07-15 DIAGNOSIS — R269 Unspecified abnormalities of gait and mobility: Secondary | ICD-10-CM

## 2016-07-15 NOTE — Progress Notes (Signed)
[place] PHYSICAL MEDICINE & REHABILITATION     PROGRESS NOTE  Subjective/Complaints:  Pt seen laying in bed this AM.  He slept well overnight and he is ready to go home.    ROS: Denies nausea, vomiting, diarrhea,  shortness of breath or chest pain  Objective: Vital Signs: Blood pressure 130/77, pulse 61, temperature 97.8 F (36.6 C), temperature source Oral, resp. rate 18, height 6\' 1"  (1.854 m), weight 113.3 kg (249 lb 12.8 oz), SpO2 97 %. No results found. No results for input(s): WBC, HGB, HCT, PLT in the last 72 hours. No results for input(s): NA, K, CL, GLUCOSE, BUN, CREATININE, CALCIUM in the last 72 hours.  Invalid input(s): CO CBG (last 3)  No results for input(s): GLUCAP in the last 72 hours.  Wt Readings from Last 3 Encounters:  07/12/16 113.3 kg (249 lb 12.8 oz)  06/21/16 116.9 kg (257 lb 11.5 oz)  03/09/16 116.1 kg (256 lb)    Physical Exam:  BP 130/77 (BP Location: Left Arm)   Pulse 61   Temp 97.8 F (36.6 C) (Oral)   Resp 18   Ht 6\' 1"  (1.854 m)   Wt 113.3 kg (249 lb 12.8 oz)   SpO2 97%   BMI 32.96 kg/m  Constitutional: He appears well-developed and well-nourished.  HENT: Normocephalic and atraumatic. Bruises on face/around eyes Eyes: EOMI. No discharge.  Cardiovascular: RRR. No JVD  Respiratory: CTA Bilaterally. Normal effort    GI: Soft. Bowel sounds are normal.  Musculoskeletal: He exhibits edema RUE. Both heel cords tight.  Neurological: He is alert and oriented.  Dysarthric speech Motor: RUE: 1-2/5 prox to distal.  LUE: Shoulder abduction, elbow flexion/extension 4/5, wrist, hand grip 2+/5,  B/l LE 4-/5 prox to distal.  Resting tone in ue 1/4 and 1+/4 LE's No tongue fasics noted Skin: Skin is warm and dry.  Psychiatric: Normal mood and behavior.  Cognition and memory are not impaired  Assessment/Plan: 1. Functional deficits secondary to cervical myelopathy which require 3+ hours per day of interdisciplinary therapy in a comprehensive  inpatient rehab setting. Physiatrist is providing close team supervision and 24 hour management of active medical problems listed below. Physiatrist and rehab team continue to assess barriers to discharge/monitor patient progress toward functional and medical goals.  Function:  Bathing Bathing position Bathing activity did not occur: Refused Position: Systems developer parts bathed by patient: Abdomen, Right arm, Front perineal area, Right upper leg, Left upper leg Body parts bathed by helper: Left arm, Chest, Buttocks, Right lower leg, Left lower leg, Back  Bathing assist Assist Level: 2 helpers      Upper Body Dressing/Undressing Upper body dressing Upper body dressing/undressing activity did not occur: Refused What is the patient wearing?: Pull over shirt/dress     Pull over shirt/dress - Perfomed by patient: Thread/unthread left sleeve Pull over shirt/dress - Perfomed by helper: Thread/unthread right sleeve, Put head through opening, Pull shirt over trunk        Upper body assist Assist Level: Touching or steadying assistance(Pt > 75%)      Lower Body Dressing/Undressing Lower body dressing   What is the patient wearing?: Pants, Ted Hose, Non-skid slipper socks       Pants- Performed by helper: Thread/unthread right pants leg, Pull pants up/down, Thread/unthread left pants leg   Non-skid slipper socks- Performed by helper: Don/doff right sock, Don/doff left sock   Socks - Performed by helper: Don/doff right sock, Don/doff left sock   Shoes -  Performed by helper: Don/doff right shoe, Don/doff left shoe       TED Hose - Performed by helper: Don/doff right TED hose, Don/doff left TED hose  Lower body assist Assist for lower body dressing:  (TOTAL)      Toileting Toileting Toileting activity did not occur:  (using urinal, no BM)   Toileting steps completed by helper: Adjust clothing prior to toileting, Performs perineal hygiene, Adjust clothing after  toileting Toileting Assistive Devices: Other (comment) (stedy)  Toileting assist Assist level: Two helpers   Transfers Chair/bed transfer   Chair/bed transfer method: Lateral scoot Chair/bed transfer assist level: Moderate assist (Pt 50 - 74%/lift or lower) Chair/bed transfer assistive device: Sliding board Mechanical lift: Stedy   Locomotion Ambulation Ambulation activity did not occur: Safety/medical concerns   Max distance: 30ft Assist level: 2 helpers   Education administrator activity did not occur: Safety/medical concerns Type: Manual Max wheelchair distance: 30 Assist Level: Dependent (Pt equals 0%)  Cognition Comprehension Comprehension assist level: Follows complex conversation/direction with no assist  Expression Expression assist level: Expresses basic 90% of the time/requires cueing < 10% of the time.  Social Interaction Social Interaction assist level: Interacts appropriately with others with medication or extra time (anti-anxiety, antidepressant).  Problem Solving Problem solving assist level: Solves complex problems: With extra time  Memory Memory assist level: Complete Independence: No helper    Medical Problem List and Plan: 1.  Deficits in mobility as well as inability to carry out ADL tasks secondary to cervical myelopathy, hx of ACDF in February 2018.  D/c today  Pt to follow up for transitional care management in 1-2 weeks 2.  DVT Prophylaxis/Anticoagulation: Pharmaceutical: Lovenox 3. Pain Management: tylenol prn.  4. Mood: LCSW to follow for evaluation and support. Team to provide ego support.  5. Neuropsych: This patient is capable of making decisions on his own behalf. 6. Skin/Wound Care: Monitor daily. Routine pressure relief measures 7. Fluids/Electrolytes/Nutrition: reasonable PO intake.              BMP WNL 6/20-discussed result with pt 8. HTN: Monitor BP bid. Continue Norvasc.Controlled 6/30   Vitals:   07/15/16 0513 07/15/16 0823  BP: 116/68  130/77  Pulse: (!) 59 61  Resp: 18 18  Temp: 97.8 F (36.6 C)    9. Spastic quadriparesis:   -baclofen to 10mg  6/24, no increase in spasm freq or severity  -using bilateral tension PRAFO's to be worn at night               Cont bracing/ROM efforts otherwise 10. Vitamin B 12 deficiency: on supplement. Last level 6/6 was normal 11. H/O of  Anxiety: Monitor for now--does not want to start any meds. Trazodone at nights prn to help manage anxiety at nights.   Stable. Pt very engaged with team 12.  Constipation cont miralax not requiring a supp for bowel program 13.  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  ALS is clinical diagnosis based on exam and EMG, not a radiologic diagnosis.  referral for OP EMG, local neurologist requested, if + findings in bulbar and LE muscles will need referral to tertiary care motor neuron clinic.  Had prior UE EMG showing diffuse denervation and large motor units of increased duration and reduce recruitment Dr Vela Prose 06/20/16   Has appt with Dr Nita Sickle next wk for EMG   LOS (Days) 22 A FACE TO FACE EVALUATION WAS PERFORMED  Ijanae Macapagal Karis Juba  07/15/2016 10:57 AM

## 2016-07-15 NOTE — Progress Notes (Signed)
Pt A/O, no noted distress. Denies pain. Pt tolerated meds. Educated pt and family on meds. Pt's daughter administered med. Staff transferred pt via wheelchair safely to lobby for discharge. Pt has all belongings.

## 2016-07-17 NOTE — Discharge Summary (Signed)
Physician Discharge Summary  Patient ID: Steve Koch MRN: [id number] DOB/AGE: 1960-07-16 56 y.o.  Admit date: 06/23/2016 Discharge date: 07/15/2016  Discharge Diagnoses:  Principal Problem:   Spastic quadriparesis (HCC) Active Problems:   Cervical myelopathy (HCC)   Essential hypertension   Benign essential HTN   Vitamin B 12 deficiency   Generalized anxiety disorder   Abnormality of gait and mobility   Dysarthria   Discharged Condition: stable  Significant Diagnostic Studies: Ct Head Wo Contrast  Result Date: 06/20/2016 CLINICAL DATA:  Fall striking face while using walker. No loss of consciousness. EXAM: CT HEAD WITHOUT CONTRAST CT MAXILLOFACIAL WITHOUT CONTRAST CT CERVICAL SPINE WITHOUT CONTRAST TECHNIQUE: Multidetector CT imaging of the head, cervical spine, and maxillofacial structures were performed using the standard protocol without intravenous contrast. Multiplanar CT image reconstructions of the cervical spine and maxillofacial structures were also generated. COMPARISON:  None. FINDINGS: CT HEAD FINDINGS Brain: No evidence of acute infarction, hemorrhage, hydrocephalus, extra-axial collection or mass lesion/mass effect. Vascular: No hyperdense vessel or unexpected calcification. Skull: No skull fracture.  Right frontal scalp hematoma. Other: None. CT MAXILLOFACIAL FINDINGS Osseous: No acute facial bone fracture. Possible remote nasal bone fracture. Zygomatic arches, mandibles, and pterygoid plates are intact. There is anterior subluxation of the a right temporomandibular joint. Orbits: No orbital fracture.  Both orbits and globes are intact. Sinuses: No fluid levels. Scattered mucosal thickening in the maxillary sinuses and ethmoid air cells. Soft tissues: Right frontal scalp hematoma. CT CERVICAL SPINE FINDINGS Alignment: Straightening of normal lordosis. No listhesis, jumped or perched facets. Lateral masses of C1 are well aligned on C2. Skull base and vertebrae: No acute  fracture. Dens and skullbase are intact. Anterior fusion C5 through C7 with intact hardware. Non fusion posterior elements of C1, a normal variant. Soft tissues and spinal canal: No prevertebral fluid or swelling. No visible canal hematoma. Disc levels: Disc spacers at C5-C6 and C6-C7. Minimal endplate spurring at C4-C5. Upper chest: No acute abnormality. Other: None. IMPRESSION: 1. Right frontal scalp hematoma. No skull fracture or acute intracranial abnormality. 2. No facial bone fracture. 3. Postsurgical change in the cervical spine with intact hardware. No fracture or acute subluxation. Electronically Signed   By: Steve OaksMelanie  Koch M.D.   On: 06/20/2016 23:10   Ct Cervical Spine Wo Contrast  Result Date: 06/20/2016 CLINICAL DATA:  Fall striking face while using walker. No loss of consciousness. EXAM: CT HEAD WITHOUT CONTRAST CT MAXILLOFACIAL WITHOUT CONTRAST CT CERVICAL SPINE WITHOUT CONTRAST TECHNIQUE: Multidetector CT imaging of the head, cervical spine, and maxillofacial structures were performed using the standard protocol without intravenous contrast. Multiplanar CT image reconstructions of the cervical spine and maxillofacial structures were also generated. COMPARISON:  None. FINDINGS: CT HEAD FINDINGS Brain: No evidence of acute infarction, hemorrhage, hydrocephalus, extra-axial collection or mass lesion/mass effect. Vascular: No hyperdense vessel or unexpected calcification. Skull: No skull fracture.  Right frontal scalp hematoma. Other: None. CT MAXILLOFACIAL FINDINGS Osseous: No acute facial bone fracture. Possible remote nasal bone fracture. Zygomatic arches, mandibles, and pterygoid plates are intact. There is anterior subluxation of the a right temporomandibular joint. Orbits: No orbital fracture.  Both orbits and globes are intact. Sinuses: No fluid levels. Scattered mucosal thickening in the maxillary sinuses and ethmoid air cells. Soft tissues: Right frontal scalp hematoma. CT CERVICAL SPINE  FINDINGS Alignment: Straightening of normal lordosis. No listhesis, jumped or perched facets. Lateral masses of C1 are well aligned on C2. Skull base and vertebrae: No acute fracture. Dens and skullbase are intact. Anterior  fusion C5 through C7 with intact hardware. Non fusion posterior elements of C1, a normal variant. Soft tissues and spinal canal: No prevertebral fluid or swelling. No visible canal hematoma. Disc levels: Disc spacers at C5-C6 and C6-C7. Minimal endplate spurring at C4-C5. Upper chest: No acute abnormality. Other: None. IMPRESSION: 1. Right frontal scalp hematoma. No skull fracture or acute intracranial abnormality. 2. No facial bone fracture. 3. Postsurgical change in the cervical spine with intact hardware. No fracture or acute subluxation. Electronically Signed   By: Steve Koch M.D.   On: 06/20/2016 23:10   Mr Steve Koch ZO Contrast  Result Date: 07/13/2016 CLINICAL DATA:  Bilateral lower extremity weakness with foot drop. Abnormal MR. EXAM: MRI HEAD WITHOUT AND WITH CONTRAST TECHNIQUE: Multiplanar, multiecho pulse sequences of the brain and surrounding structures were obtained without and with intravenous contrast. CONTRAST:  19mL MULTIHANCE GADOBENATE DIMEGLUMINE 529 MG/ML IV SOLN COMPARISON:  07/11/2016 FINDINGS: Brain: Examination again shows abnormal T2 and FLAIR signal affecting the precentral gyri and extending along the course of the corticospinal tracts through the brainstem, suggestive of amyotrophic lateral sclerosis. Elsewhere, the brain appears normal with exception of a small focus of T2 and FLAIR signal in the right caudate head that could be an old small vessel infarction. No evidence of mass lesion, hemorrhage, hydrocephalus or extra-axial collection. After contrast administration, no abnormal brain enhancement occurs. Vascular: Major vessels at the base of the brain show flow. Skull and upper cervical spine: Negative Sinuses/Orbits: Clear/normal Other: None significant  IMPRESSION: Abnormal T2 and FLAIR signal affecting the precentral gyri and both corticospinal tracts in a symmetric fashion, essentially diagnostic of amyotrophic lateral sclerosis. The brain otherwise appears normal with exception of a small lacunar infarction in the right caudate head. Electronically Signed   By: Paulina Fusi M.D.   On: 07/13/2016 10:42   Mr Steve Koch XW Contrast  Result Date: 07/11/2016 CLINICAL DATA:  56 y/o M; abnormality of gait and mobility. Focus on brainstem, rule out ALS. History of falls and possible traumatic brain injury. EXAM: MRI HEAD WITHOUT AND WITH CONTRAST TECHNIQUE: Multiplanar, multiecho pulse sequences of the brain and surrounding structures were obtained without and with intravenous contrast. CONTRAST:  20mL MULTIHANCE GADOBENATE DIMEGLUMINE 529 MG/ML IV SOLN COMPARISON:  06/20/2016 CT of head. FINDINGS: Brain: Abnormal symmetric increased T2 FLAIR signal is present within the cortical spinal tracts from subcortical white matter to the brainstem. Single additional punctate focus of T2 FLAIR hyperintense signal is present in right frontal subcortical white matter likely representing minimal chronic microvascular ischemic change. The no diffusion restriction to suggest acute or early subacute infarction. With no evidence for cortical contusion. No abnormal susceptibility hypointensity to indicate intracranial hemorrhage or sequelae diffuse axonal injury. No hydrocephalus, extra-axial collection, or focal mass effect. After administration of intravenous contrast there is no abnormal enhancement of the brain. Vascular: Normal flow voids. Skull and upper cervical spine: Normal marrow signal. Sinuses/Orbits: Negative. Other: None. IMPRESSION: 1. Abnormal symmetric increased T2 FLAIR signal within cortical spinal tracts from centrum semiovale to the brainstem. 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 No additional findings to suggest differential  considerations of demyelination, metabolic disease, or wallerian degeneration. 2. No MRI findings of traumatic brain injury. 3. No evidence for acute infarct, hemorrhage, focal mass effect, or abnormal enhancement. Electronically Signed   By: Mitzi Hansen M.D.   On: 07/11/2016 23:58   Mr Cervical Spine W Wo Contrast  Result Date: 07/13/2016 CLINICAL DATA:  Recent cervical  ACDF. Bilateral lower extremity weakness and foot drop since then. Abnormal exam 07/11/2016. EXAM: MRI CERVICAL SPINE WITHOUT AND WITH CONTRAST TECHNIQUE: Multiplanar and multiecho pulse sequences of the cervical spine, to include the craniocervical junction and cervicothoracic junction, were obtained without and with intravenous contrast. CONTRAST:  19mL MULTIHANCE GADOBENATE DIMEGLUMINE 529 MG/ML IV SOLN COMPARISON:  07/11/2016.  06/20/2016. FINDINGS: Alignment: Normal Vertebrae: Previous ACDF C5-C7.  No complicating feature seen. Cord: No cord compression or primary cord lesion. Artifact in the fusion segment. Posterior Fossa, vertebral arteries, paraspinal tissues: See results of brain exam. Disc levels: Previous ACDF C5-C7. Satisfactory appearance at those levels allowing for the artifact. Small right-sided osteophytes at C5-6 but without likely significant encroachment upon the neural spaces. No abnormality seen from the skullbase to C5 other than mild facet osteoarthritis on the left at C3-4. C7-T1 shows mild facet osteoarthritis but no stenosis of the canal or foramina. IMPRESSION: Previous ACDF C5-C7. Small osteophytes on the right at the C5-6 level with mild narrowing of the canal, but no likely significant stenosis. No significant finding at the other cervical levels. Electronically Signed   By: Paulina Fusi M.D.   On: 07/13/2016 10:36   Ct Maxillofacial Wo Contrast  Result Date: 06/20/2016 CLINICAL DATA:  Fall striking face while using walker. No loss of consciousness. EXAM: CT HEAD WITHOUT CONTRAST CT MAXILLOFACIAL  WITHOUT CONTRAST CT CERVICAL SPINE WITHOUT CONTRAST TECHNIQUE: Multidetector CT imaging of the head, cervical spine, and maxillofacial structures were performed using the standard protocol without intravenous contrast. Multiplanar CT image reconstructions of the cervical spine and maxillofacial structures were also generated. COMPARISON:  None. FINDINGS: CT HEAD FINDINGS Brain: No evidence of acute infarction, hemorrhage, hydrocephalus, extra-axial collection or mass lesion/mass effect. Vascular: No hyperdense vessel or unexpected calcification. Skull: No skull fracture.  Right frontal scalp hematoma. Other: None. CT MAXILLOFACIAL FINDINGS Osseous: No acute facial bone fracture. Possible remote nasal bone fracture. Zygomatic arches, mandibles, and pterygoid plates are intact. There is anterior subluxation of the a right temporomandibular joint. Orbits: No orbital fracture.  Both orbits and globes are intact. Sinuses: No fluid levels. Scattered mucosal thickening in the maxillary sinuses and ethmoid air cells. Soft tissues: Right frontal scalp hematoma. CT CERVICAL SPINE FINDINGS Alignment: Straightening of normal lordosis. No listhesis, jumped or perched facets. Lateral masses of C1 are well aligned on C2. Skull base and vertebrae: No acute fracture. Dens and skullbase are intact. Anterior fusion C5 through C7 with intact hardware. Non fusion posterior elements of C1, a normal variant. Soft tissues and spinal canal: No prevertebral fluid or swelling. No visible canal hematoma. Disc levels: Disc spacers at C5-C6 and C6-C7. Minimal endplate spurring at C4-C5. Upper chest: No acute abnormality. Other: None. IMPRESSION: 1. Right frontal scalp hematoma. No skull fracture or acute intracranial abnormality. 2. No facial bone fracture. 3. Postsurgical change in the cervical spine with intact hardware. No fracture or acute subluxation. Electronically Signed   By: Steve Koch M.D.   On: 06/20/2016 23:10    Labs:   Basic Metabolic Panel: BMP Latest Ref Rng & Units 07/12/2016 07/05/2016 06/28/2016  Glucose 65 - 99 mg/dL 94 161(W) 960(A)  BUN 6 - 20 mg/dL 13 15 16   Creatinine 0.61 - 1.24 mg/dL 5.40 9.81 1.91  Sodium 135 - 145 mmol/L 141 141 137  Potassium 3.5 - 5.1 mmol/L 4.4 4.7 4.6  Chloride 101 - 111 mmol/L 106 105 103  CO2 22 - 32 mmol/L 27 29 25   Calcium 8.9 - 10.3 mg/dL 9.5  9.3 8.8(L)    CBC: CBC Latest Ref Rng & Units 07/12/2016 07/05/2016 06/28/2016  WBC 4.0 - 10.5 K/uL 9.6 9.8 9.0  Hemoglobin 13.0 - 17.0 g/dL 16.1 09.6 04.5  Hematocrit 39.0 - 52.0 % 44.1 43.3 41.2  Platelets 150 - 400 K/uL 238 282 266    Other Labs: TSH     Status: None   Collection Time: 06/20/16 11:57 PM  Result Value Ref Range   TSH 1.758 0.350 - 4.500 uIU/mL    Comment: Performed by a 3rd Generation assay with a functional sensitivity of <=0.01 uIU/mL.  Vitamin B12     Status: None   Collection Time: 06/21/16  9:40 AM  Result Value Ref Range   Vitamin B-12 228 180 - 914 pg/mL    Comment: (NOTE) This assay is not validated for testing neonatal or myeloproliferative syndrome specimens for Vitamin B12 levels. Performed at [facility], [address], [place], [place] [address]   HIV antibody     Status: None   Collection Time: 06/21/16 11:10 AM  Result Value Ref Range   HIV Screen 4th Generation wRfx Non Reactive Non Reactive    Comment: (NOTE) Performed At: [facility] [address] [place], [place] [id number] Mila Homer MD AO:[id number]   Acetylcholine Receptor Ab, All     Status: None   Collection Time: 06/22/16  5:40 AM  Result Value Ref Range   Acety choline binding ab <0.03 0.00 - 0.24 nmol/L    Comment: (NOTE)                               Negative:   0.00 - 0.24                               Borderline: 0.25 - 0.40                               Positive:        > 0.40    Acetylchol Block Ab 12 0 - 25 %    Comment: (NOTE)                               Negative:      0 -  25                               Borderline:   26 - 30                               Positive:         >30 Results for this test are for research purposes only by the assay's manufacturer.  The performance characteristics of this product have not been established.  Results should not be used as a diagnostic procedure without confirmation of the diagnosis by another medically established diagnostic product or procedure.    Acetylcholine Modulat Ab <12 0 - 20 %    Comment: (NOTE)                              Negative:          <  21                              Equivocal:     21 - 25                              Positive:          >25 The assay is linear between values of 12 and 64. Those <12 and >64 are reported as such.  No single value for ACR-modulating antibody should be used as a sole basis for diagnosis or response to therapy. Performed At: [facility] [address] [place], [place] [id number] Mila Homer MD WU:[id number]   CK total and CKMB (cardiac)not at [facility]     Status: Abnormal   Collection Time: 06/22/16  5:40 AM  Result Value Ref Range   Total CK 126 49 - 397 U/L   CK, MB 4.3 0.5 - 5.0 ng/mL   Relative Index 3.4 (H) 0.0 - 2.5    Comment: Performed at [facility], [address], [place], [place] [address]   CBG: No results for input(s): GLUCAP in the last 168 hours.  Brief HPI:   Steve Koch a 56 y.o.male with history of GERD, fall at work with RUE> LUE weakness due to 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 History taken from chart review and patient. He underwent ACDF C5-C7 02/2016 and has had progressive weakness BLE.  MRI 6/1 done revealing stable ACDF with persistent abutment to right ventral cord due to osteophytes C5/6, no definite cord signal abnormality and T2-T3 left focal paracentral disc protrusion. He has had difficulty walking and sustained a fall and struck his head and was admitted on [facility] 06/20/16 for  work up. CT head negative. Mild dysarthria of questionable etiology. Neurology recommended EMG/NCS for work up--done recently and results unavailable. Vitamin B 12 levels--228 and supplement added.  HIV- NR.  CK and TSH levels WNL. Acetylcholine AB pending.  Intensive therapy recommended due to cervical myelopathy with deficits in mobility as well as inability to carry out ADL tasks. CIR consulted and patient felt to be a good candidate.    Hospital Course: Dorthy Hustead was admitted to rehab 06/23/2016 for inpatient therapies to consist of PT, ST and OT at least three hours five days a week. Past admission physiatrist, therapy team and rehab RN have worked together to provide customized collaborative inpatient rehab. Blood pressures were monitored on bid basis and have been controlled on low dose amlodipine. Baclofen was titrated upwards to help manage spasticity and PRAFO as well as hand splints were used for stretching.  Vitamin B 12 injections were given during his stay to help supplement B 12 levels. Team has provided a lot of ego support during his stay and he decline starting any medications to help manage symptoms. He has been very concerned regarding questions of ALS per prior testing.  MRI brain of brain with focus on brain stem was done and showed "abnormal symmetric increased T2 FLAIR signal within cortical spinal tracts from centrum semiovale to the brainstem that could be consistent with ALS" Neurology was consulted for input and recommended EMG for further work up. Repeat MRI brain and spine were done showing abnormal T2 and flair signal affecting the precentral gyri and both corticospinal tracts in a symmetric fashion felt to be essentially diagnostic of ALS. Patient has  been referred to Dr. Nita Sickle for work up and appropriate treatment.   Trazodone has been used to help manage insomnia.  He continues to have dysarthric speech but clarity has improved and he is tolerating current diet  without difficulty. He has had improvement in BLE strength to 4-/5 proximal to distal , RUE 1-2/5 and LUE  4/5 with wrist/hand grip 2+/5.  Resting tone is 1/4 BUE and 1+/4 BLE and no tongue fasciculation noted. He has progressed to mod assist/wheelchair level and will continue to receive follow up outpatient therapy after discharge.    Rehab course: During patient's stay in rehab weekly team conferences were held to monitor patient's progress, set goals and discuss barriers to discharge. At admission, patient required max to total assist with ADL tasks and total assist with mobility. He exhibited moderate dysarthria with motor weakness affecting speech and no signs of dyphagia noted on BSS.  He  has had improvement in activity tolerance, balance, postural control, as well as ability to compensate for deficits. He is has had improvement in functional use RUE/LUE  and RLE/LLE as well as improved awareness. He requires min assist for UB dressing and total assist with LB dressing. He requires mod assist with bed mobility, min to mod assist for sliding board transfers and +2  Mod to total assist to ambulate 72' with BPRW. He is able to recall and use speech intelligibility strategies independently and able to self correct errors. Family education was completed with wife who will assist as needed after discharge.     Disposition: 01-Home or Self Care  Diet: Regular  Special Instructions: 1. No driving or strenuous activity. 2. Continue HEP  Discharge Instructions    Ambulatory referral to Neurology    Complete by:  As directed    An appointment is requested in approximately: 7-10 days Did have cervical EMG done last fall and PTA.  Needs EMG for work up     Allergies as of 07/15/2016      Reactions   Lyrica [pregabalin] Swelling      Medication List    STOP taking these medications   ibuprofen 200 MG tablet Commonly known as:  ADVIL,MOTRIN   omeprazole 20 MG capsule Commonly known as:   PRILOSEC Replaced by:  pantoprazole 40 MG tablet   oxymetazoline 0.05 % nasal spray Commonly known as:  AFRIN     TAKE these medications   amLODipine 2.5 MG tablet Commonly known as:  NORVASC Take 1 tablet (2.5 mg total) by mouth daily.   aspirin EC 81 MG tablet Take 81 mg by mouth daily.   baclofen 10 MG tablet Commonly known as:  LIORESAL Take 1 tablet (10 mg total) by mouth 4 (four) times daily. What changed:  when to take this   cyanocobalamin 1000 MCG/ML injection Commonly known as:  (VITAMIN B-12) Inject 1 mL (1,000 mcg total) into the muscle every 30 (thirty) days. What changed:  when to take this  additional instructions   cyanocobalamin 500 MCG tablet Take 1 tablet (500 mcg total) by mouth daily. What changed:  You were already taking a medication with the same name, and this prescription was added. Make sure you understand how and when to take each.   fluticasone 50 MCG/ACT nasal spray Commonly known as:  FLONASE Place 1 spray into both nostrils daily.   pantoprazole 40 MG tablet Commonly known as:  PROTONIX Take 1 tablet (40 mg total) by mouth daily. Replaces:  omeprazole 20 MG capsule  traZODone 50 MG tablet Commonly known as:  DESYREL Take 1 tablet (50 mg total) by mouth at bedtime as needed for sleep.      Follow-up Information    Ranelle Oyster, MD Follow up.   Specialty:  Physical Medicine and Rehabilitation Why:  office will call you with follow up appointment Contact information: [address] [place] [address] [phone]        Glendale Chard, DO Follow up on 07/18/2016.   Specialty:  Neurology Why:  Be there 2:45 pm Contact information: [address] [place] [address] [phone]        Venita Lick, MD Follow up.   Specialty:  Orthopedic Surgery Contact information: [address] [place] [address] [phone]        [facility]  Follow up on 07/21/2016.   Specialty:  Family Medicine Why:  @ 2:30 pm with Dr. Assunta Found (hospital follow up appt) Contact information: [address] [place] [address] [phone]           Signed: Jacquelynn Cree 07/17/2016, 12:08 PM

## 2016-07-17 NOTE — Progress Notes (Signed)
Social Work  Discharge Note  The overall goal for the admission was met for:   Discharge location: Yes - home with wife who can provide 24/7 assistance  Length of Stay: Yes - 22 days  Discharge activity level: No - most goals downgraded - mod - max assist w/c level  Home/community participation: No  Services provided included: MD, RD, PT, OT, SLP, RN, TR, Pharmacy, Neuropsych and SW  Financial Services: Private Insurance: [place]  Follow-up services arranged: Outpatient: PT, OT, ST via [facility], DME: 20x18 lightweightw/c, cushion, hospital bed, drop arm commode, tub bench, bilateral platform attachments for walker, 30" transfer board all via [place] and Patient/Family has no preference for HH/DME agencies  Comments (or additional information):  Patient/Family verbalized understanding of follow-up arrangements: Yes  Individual responsible for coordination of the follow-up plan: pt  Confirmed correct DME delivered: Grasiela Jonsson 07/17/2016    Patsye Sullivant

## 2016-07-18 ENCOUNTER — Encounter: Payer: Self-pay | Admitting: Neurology

## 2016-07-18 ENCOUNTER — Ambulatory Visit (INDEPENDENT_AMBULATORY_CARE_PROVIDER_SITE_OTHER): Payer: BLUE CROSS/BLUE SHIELD | Admitting: Neurology

## 2016-07-18 ENCOUNTER — Ambulatory Visit: Payer: BLUE CROSS/BLUE SHIELD | Admitting: Occupational Therapy

## 2016-07-18 VITALS — BP 134/80 | HR 71 | Wt 250.0 lb

## 2016-07-18 DIAGNOSIS — G1221 Amyotrophic lateral sclerosis: Secondary | ICD-10-CM | POA: Diagnosis not present

## 2016-07-18 NOTE — Patient Instructions (Signed)
1.  NCS/EMG of the right leg 2.  Check labs on Thursday 3.  We will send a referral to Palliative Care

## 2016-07-18 NOTE — Progress Notes (Signed)
[facility] Clinic Note - Initial Visit   Date: 07/18/16  Steve Koch MRN: [id number] DOB: 04-14-1960   Dear Dr. Phillips Odor:  Thank you for your kind referral of Steve Koch for consultation of motor neuron disease. Although his history is well known to you, please allow [place] to reiterate it for the purpose of our medical record. The patient was accompanied to the clinic by wife who also provides collateral information.     History of Present Illness: Steve Koch is a 56 y.o. 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  He injured his right elbow and went to [employer] and picked up some supplies to clean his elbow.  He developed generalized soreness, but continued to work. He does not recall having any arm or leg weakness or neck pain immediately following this fall. Three weeks later, he was getting gas at the could not extend right pointer finger to push the buttons.  He went to  [facility] who recommended that he see a specialist.  Because this was work-related injury, he started medical evauation under Workers Compensation, but after much delay, he was eventually seen by [employer]. 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 The patient was taken to surgery ( ACDF)on 03/09/2016 performed by Dr. Venita Lick. Since the surgery, he noticed some mild improvement of his arm for a few weeks. He was discharged with outpatient therapy and was using a cane for ambulation at this time.  Around March 2018, he started having increased falls.  In May 2018, he was trying to get up from a chair and lost  balance and fell forward, unable to hold onto the walker.  Since this time, he has had progressive arm and leg weakness as well as difficulty with his speech.  He underwent NCS/EMG of the arms performed by Dr. Vela Prose which showed diffuse denervation changes with periodic fasciculation potentials and was recommended to have further evaluation of the lower extremities. However, once he returned home from his nerve study, he had another fall and was admitted to [facility] from 6/5 - 6/8 for progressive leg weakness. His vitamin B 12 level was found to be low and he was started on supplementation. TSH and CK was within normal limits. He did not have any neurological testing while admitted, but when he was discharged to rehabilitation facility, his neurological complaints were readdressed by his team and he had MRI brain and cervical spine. Imaging of his spine showed decompressed spinal cord at C5-6 and postoperative changes. 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  He was recommended to follow up with a neuromuscular disease specialist.  Upon further questioning, patient endorses difficulty with speech over the past few months, however was attributed to anterior approach for his cervical decompression. His speech is more effortful and slurred. He has noticed muscle twitches over the legs.and arms over the past few months. He denies any difficulty with swallowing liquids or solids. He denies any orthopnea and sleeps on one pillow.  Mood is down since hearing he may have ALS. He has been  wheelchair bound since his last fall on 06/21/2016.  He is unable to stand without assistance. He has arm weakness which limits his ADLs.  He is able to feed himself with his hands, but unable to hold silverware. His wife assists with bathing, personal hygiene, and dressing.  Out-side paper records, electronic medical record, and images  have been reviewed where available and summarized as:  MRI cervical spine wwo contrast 07/13/2016: Previous ACDF C5-C7. Small osteophytes on the right at the C5-6 level with mild narrowing of the canal, but no likely significant stenosis.  No significant finding at the other cervical levels.  MRI brain wwo contrast 07/13/2016: 1. Abnormal symmetric increased T2 FLAIR signal within cortical spinal tracts from centrum semiovale to the brainstem. 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 No additional findings to suggest differential considerations of demyelination, metabolic disease, or wallerian degeneration. 2. No MRI findings of traumatic brain injury. 3. No evidence for acute infarct, hemorrhage, focal mass effect, or abnormal enhancement.  NCS/EMG of the upper extremities 06/21/2016 performed by Dr.Lewit:  Diffuse denervation in the upper extremities with periodic fasciculations.  MRI cervical spine 06/16/2016:  Interval C5-7 ACDF.  There is persistent abutment of the right central spinal canal at C5-6 secondary to osteophytes.   Lab Results  Component Value Date   CKTOTAL 126 06/22/2016   CKMB 4.3 06/22/2016   Lab Results  Component Value Date   TSH 1.758 06/20/2016   Lab Results  Component Value Date   VITAMINB12 228 06/21/2016    Past Medical History:  Diagnosis Date  . Anxiety    situational with injury  . Carpal tunnel syndrome    right hand  . GERD (gastroesophageal reflux disease)   . Hypertension   . PONV (postoperative nausea and vomiting)     Past Surgical History:  Procedure Laterality Date  . ANTERIOR CERVICAL DECOMP/DISCECTOMY FUSION N/A 03/09/2016   Procedure: Anterior Cervical Discectomy Fusion Cervial 5-7;  Surgeon: Venita Lickahari Brooks, MD;  [facility];  Service: Orthopedics;  Laterality: N/A;  Requests for 3.5 hrs  . BACK SURGERY    . COLONOSCOPY    . HAND SURGERY Left   . HERNIA REPAIR Right    inguinal  . KNEE ARTHROSCOPY  Right      Medications:  Outpatient Encounter Prescriptions as of 07/18/2016  Medication Sig  . amLODipine (NORVASC) 2.5 MG tablet Take 1 tablet (2.5 mg total) by mouth daily.  Marland Kitchen. aspirin EC 81 MG tablet Take 81 mg by mouth daily.  . baclofen (LIORESAL) 10 MG tablet Take 1 tablet (10 mg total) by mouth 4 (four) times daily.  . fluticasone (FLONASE) 50 MCG/ACT nasal spray Place 1 spray into both nostrils daily.  . pantoprazole (PROTONIX) 40 MG tablet Take 1 tablet (40 mg total) by mouth daily.  . traZODone (DESYREL) 50 MG tablet Take 1 tablet (50 mg total) by mouth at bedtime as needed for sleep.  . vitamin B-12 500 MCG tablet Take 1 tablet (500 mcg total) by mouth daily.  . [DISCONTINUED] cyanocobalamin (,VITAMIN B-12,) 1000 MCG/ML injection Inject 1 mL (1,000 mcg total) into the muscle every 30 (thirty) days.   No facility-administered encounter medications on file as of 07/18/2016.      Allergies:  Allergies  Allergen Reactions  . Lyrica [Pregabalin] Swelling    Family History: Family History  Problem Relation Age of Onset  . Pneumonia Mother   . Hypertension Mother   . Diabetes Father   .  Cancer Father     Social History: Social History  Substance Use Topics  . Smoking status: Former Smoker    Quit date: 03/07/2006  . Smokeless tobacco: Never Used  . Alcohol use Yes     Comment: occasional   Social History   Social History Narrative   Lives in [place], [place] with his wife, 4 cats and 3 dogs. Previously worked as a Civil Service fast streamer for [employer].    Review of Systems:  CONSTITUTIONAL: No fevers, chills, night sweats, +weight loss.   EYES: No visual changes or eye pain ENT: No hearing changes.  No history of nose bleeds.   RESPIRATORY: No cough, wheezing and shortness of breath.   CARDIOVASCULAR: Negative for chest pain, and palpitations.   GI: Negative for abdominal discomfort, blood in stools or black stools.  No recent change in bowel habits.   GU:  No history of  incontinence.   MUSCLOSKELETAL: No history of joint pain or swelling.  No myalgias.   SKIN: Negative for lesions, rash, and itching.   HEMATOLOGY/ONCOLOGY: Negative for prolonged bleeding, bruising easily, and swollen nodes.  No history of cancer.   ENDOCRINE: Negative for cold or heat intolerance, polydipsia or goiter.   PSYCH:  +depression or anxiety symptoms.   NEURO: As Above.   Vital Signs:  BP 134/80   Pulse 71   Wt 250 lb (113.4 kg) Comment: approximately per patient  SpO2 100%   BMI 32.98 kg/m    General Medical Exam:   General:  Well appearing, comfortable, sitting in wheelchair.    Neurological Exam: MENTAL STATUS including orientation to time, place, person, recent and remote memory, attention span and concentration, language, and fund of knowledge is normal.  Speech is moderately dysarthric, spastic.  CRANIAL NERVES: II:  No visual field defects   III-IV-VI: Pupils equal round and reactive to light.  Normal conjugate, extra-ocular eye movements in all directions of gaze.  No nystagmus.  No ptosis.   V:  Normal facial sensation.  Jaw jerk is present.   VII:  Normal facial symmetry and movements.  Snout, palmomental reflex, and Myerson's sign is present.   Orbicularis oris, orbicularis oculi is 5/5. Buccinator is 4/5 VIII:  Normal hearing and vestibular function.   IX-X:  Normal palatal movement.   XI:  Normal shoulder shrug and head rotation.   XII:  There is mild weakness with tongue protrusion and movement are moderately slowed. There are fasciculations seen in the tongue.   MOTOR:  Moderate forearm and quadriceps atrophy. Active fasciculations are seen over the left upper extremity.  Neck flexion 4/5 Right Upper Extremity:    Left Upper Extremity:    Deltoid  3/5   Deltoid  3/5   Biceps  3-/5   Biceps  3+/5   Triceps  3/5   Triceps  3/5   Wrist extensors  2/5   Wrist extensors  3/5   Wrist flexors  2/5   Wrist flexors  3/5   Finger extensors  1/5   Finger  extensors  3/5   Finger flexors  1/5   Finger flexors  3/5   Dorsal interossei  1/5   Dorsal interossei  2/5   Abductor pollicis  1/5   Abductor pollicis  2/5   Tone (Ashworth scale)  0  Tone (Ashworth scale)  1   Right Lower Extremity:    Left Lower Extremity:    Hip flexors  3/5   Hip flexors  3/5   Hip extensors  3/5   Hip extensors  3/5   Knee flexors  4/5   Knee flexors  4/5   Knee extensors  4/5   Knee extensors  4/5   Dorsiflexors  4/5   Dorsiflexors  4/5   Plantarflexors  4/5   Plantarflexors  4/5   Toe extensors  3/5   Toe extensors  3/5   Toe flexors  3/5   Toe flexors  3/5   Tone (Ashworth scale)  1  Tone (Ashworth scale)  1   MSRs:  Right                                                                 Left brachioradialis 3+  brachioradialis 3+  biceps 3+  biceps 3+  triceps 3+  triceps 3+  patellar 3+  patellar 3+  ankle jerk 3+  ankle jerk 3+  Hoffman no  Hoffman no  plantar response up  plantar response up  Crossed adductors and medial pectoralis reflexes are present and brisk.  SENSORY:  Normal and symmetric perception of light touch, vibration, and temperature.  COORDINATION/GAIT:  Gait was not tested as patient is wheelchair bound.   IMPRESSION: Mr. Grieshop is a 56 year old gentleman referred for evaluation of progressively worsening weakness of the arms and legs and dysarthria since spring 2018. Because patient had suffered a fall in October 2017, his symptoms were being attributed to cervical myelopathy due to herniated disc for which he underwent ACDF at C5-6 in February 2018. There was transient improvement of his right arm weakness following surgery showing that he certainly was symptomatic from his cord compression. 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  Because of his ongoing weakness, patient underwent  electrodiagnostic testing of the arms which was reviewed and shows diffuse active on chronic denervation and MRI of the brain shows T2 hyperintensity involving the cortical spinal tracts, as seen in ALS.  MRI of the cervical spine shows decompressed spinal cord at the C5-6 level.  On exam, there is evidence of upper motor neuron dysfunction in the bulbar, cervical, and lumbosacral region as noted by pathological facial reflexes, hyperreflexia of the limbs, and spasticity of the left arm and legs. Lower motor neuron dysfunction is evidenced by fasciculations of the bulbar and cervical region along with reduced tone in the right upper extremity and generalized weakness.   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 Laboratory testing for extrinsic injury to the motor nerves will be checked for inflammation, nutritional deficiency, and paraproteinemias. If, however, there is little compelling evidence from the remainder of the work-up to suggest external injury to motor neurons, the diagnosis of amyotrophic lateral sclerosis is certain.  There is no family history of ALS, however I did mention considering genetic testing for MND.  I had extensive discussion regarding the pathophysiology, etiology, and management of motor neuron disease with patient and wife.  We discussed FDA approved medications are riluzole and Radicava which can slow the progression of the disease, however will not cure the disease. At this point, they do not wish to  start any medication and will think about this. Given the fairly rapid deterioration over the past few months, it is very likely that he will continue to progress at this rate. He is already nonambulatory and dependent for nearly all ADLs. His neck weakness and dysarthria, I anticipate that he will have difficulty with dysphasia and dyspnea in the very near future. Patient and his  wife were understandably very emotional at today's visit and I did not discuss goals of care as to allow them time to process this unfortunate diagnosis and grim outlook. I will certainly discuss this during next visit.     PLAN/RECOMMENDATIONS:  1.  We discussed a high clinical probability in my opinion that he has ALS, however since there has been conversational further electrodiagnostic in the past, he is interested in completing this testing. Therefore, he will be scheduled for electrodiagnostic testing of the bulbar, thoracic and lumbosacral regions.  2.  Check CRP, copper, SPEP/UPEP with IFE, PTH, GM1 antibody  3.  Referral to Hospice Care given the rapidly deteriorating functional status the patient.    Return to clinic in 1 month   The duration of this appointment visit was 110 minutes of face-to-face time with the patient.  Greater than 50% of this time was spent in counseling, explanation of diagnosis, planning of further management, and coordination of care.   Thank you for allowing me to participate in patient's care.  If I can answer any additional questions, I would be pleased to do so.    Sincerely,    Percival Glasheen K. Allena Katz, DO

## 2016-07-20 ENCOUNTER — Other Ambulatory Visit (INDEPENDENT_AMBULATORY_CARE_PROVIDER_SITE_OTHER): Payer: BLUE CROSS/BLUE SHIELD

## 2016-07-20 ENCOUNTER — Ambulatory Visit (INDEPENDENT_AMBULATORY_CARE_PROVIDER_SITE_OTHER): Payer: BLUE CROSS/BLUE SHIELD | Admitting: Neurology

## 2016-07-20 ENCOUNTER — Other Ambulatory Visit: Payer: Self-pay | Admitting: *Deleted

## 2016-07-20 DIAGNOSIS — J988 Other specified respiratory disorders: Secondary | ICD-10-CM

## 2016-07-20 DIAGNOSIS — G1221 Amyotrophic lateral sclerosis: Secondary | ICD-10-CM

## 2016-07-20 DIAGNOSIS — R531 Weakness: Secondary | ICD-10-CM

## 2016-07-20 DIAGNOSIS — R0601 Orthopnea: Secondary | ICD-10-CM

## 2016-07-20 DIAGNOSIS — J99 Respiratory disorders in diseases classified elsewhere: Secondary | ICD-10-CM

## 2016-07-20 LAB — C-REACTIVE PROTEIN: CRP: 0.2 mg/dL — AB (ref 0.5–20.0)

## 2016-07-20 NOTE — Procedures (Signed)
[facility]  [address], [address]  [place], [place] [address] Tel: [phone] Fax:  [phone] Test Date:  07/20/2016  Patient: Steve ShadCharles Koch DOB: 1960-06-08 Physician: Nita Sickleonika Laporshia Hogen, DO  Sex: Male   Ref Phys: Nita Sickleonika Khaleah Duer, DO  ID#: [id number]   Technician:    Patient Complaints: This is a 56 year-old gentleman referred for evaluation of progressive painless weakness and dysarthria concerning for ALS.  NCV & EMG Findings: Extensive electrodiagnostic testing of the right lower extremity and additional studies of the bulbar and midthoracic spinal level shows:  1. Right superficial peroneal sensory response was hampered by lower extremity edema and shows reduced amplitude, which is most likely technical in nature. Right sural sensory responses within normal limits. 2. Right peroneal motor response at the extensor digitorum brevis is absent. Right tibial and peroneal motor response (tibialis anterior) shows markedly reduced amplitude (1.2, 1.8 mV). 3. Active on chronic motor axon loss changes are seen affecting the muscles of the right lower extremity affecting the L2-S1 myotomes.  Fasciculation potentials or active in the right rectus femoris muscle. 4. Active denervation is present in the midthoracic paraspinal muscles at T7 and T11 levels. 5. Active motor axon loss changes are seen in the mentalis and hyoglossus muscles.   Impression: Taken together, there is evidence of a widespread disorder of anterior horn cells affecting the bulbar, thoracic, and lumbosacral regions and supportive of the diagnosis of motor neuron disease, including amyotrophic lateral sclerosis (ALS). .   ___________________________ Nita Sickleonika Samra Pesch, DO    Nerve Conduction Studies Anti Sensory Summary Table   Site NR Peak (ms) Norm Peak (ms) P-T Amp (V) Norm P-T Amp  Right Sup Peroneal Anti Sensory (Ant Lat Mall)  34.3C    Lower extremity edema  12 cm    3.0 <4.6 3.6 >4  Right Sural Anti  Sensory (Lat Mall)  34.3C  Calf    3.6 <4.6 5.1 >4   Motor Summary Table   Site NR Onset (ms) Norm Onset (ms) O-P Amp (mV) Norm O-P Amp Site1 Site2 Delta-0 (ms) Dist (cm) Vel (m/s) Norm Vel (m/s)  Right Peroneal Motor (Ext Dig Brev)  34.3C  Ankle NR  <6.0  >2.5 B Fib Ankle  0.0  >40  B Fib NR     Poplt B Fib  0.0  >40  Poplt NR            Right Peroneal TA Motor (Tib Ant)  34.3C  Fib Head    3.4 <4.5 1.8 >3 Poplit Fib Head 1.4 8.0 57 >40  Poplit    4.8  1.7         Right Tibial Motor (Abd Hall Brev)  34.3C  Ankle    4.7 <6.0 1.2 >4 Knee Ankle  0.0  >40  Knee NR             EMG   Side Muscle Ins Act Fibs Psw Fasc Number Recrt Dur Dur. Amp Amp. Poly Poly. Comment  Right Hyoglossus Nml 1+ Nml Nml 1- Rapid Some 1+ Some 1+ Nml Nml N/A  Right BicepsFemS Nml 1+ Nml Nml 2- Rapid Some 1+ Some 1+ Nml Nml N/A  Right RectFemoris Nml 1+ Nml 2+ 3- Rapid Most 1+ Most 1+ Most 1+ N/A  Right GlutMedius Nml 1+ Nml 2+ 2- Rapid Most 1+ Most 1+ Most 1+ N/A  Right Gastroc Nml 1+ Nml Nml 3- Rapid Most 1+ Most 1+ Many 1+ N/A  Right T7 Parasp Nml 1+ Nml  Nml NE - - - - - - - N/A  Right T11 Parasp Nml 1+ Nml Nml NE - - - - - - - N/A  Right Mentalis Nml 1+ Nml Nml Nml Nml Nml Nml Nml Nml Nml Nml N/A  Right AntTibialis Nml 1+ Nml Nml [facility] Rapid All 2+ All 2+ All 1+ N/A      Waveforms:

## 2016-07-20 NOTE — Progress Notes (Signed)
Follow-up Note 07/20/16  Patient was seen in the clinic today for electrodiagnostic testing to assess in the diagnosis of probable ALS, which showed active on chronic motor axon loss changes affecting the bulbar, thoracic, and lumbosacral regions, consistent with ALS. Please see attached EMG report for details.  He was here today with his wife, Alvis LemmingsDawn, and daughter, Philippa ChesterBrittney.  The results of the EMG testing showing ALS was discussed with patient and his family.  I had previously mentioned this diagnosis at his last clinic visit given his pronounced clinical symptoms, exam findings, and workup to date. They had many questions about the disease course and what to expect going forward. I addressed their concerns and given the rapid deterioration over the past few months, I anticipate that his disease will continue to deteriorate at a much faster pace and survival is probably months.  He denies any shortness of breath, however does endorse orthopnea and difficulty with generating cough.  I would like to seek the recommendations of pulmonology for his neuromuscular weakness for noninvasive respiratory support.  At today's visit, I asked the family to start thinking about goals of care and whether they would like to be aggressive or prefer to focus on quality of life. Family says they have started this discussion patient does not wish to prolong the disease. I have discussed the various options for management (medications, clinical trials at academic centers, supportive care, etc.) and they would like to think through their options and we will readdress this at their next visit in 1 month.  In the meantime, the family needs assistance at home as wife is the primary caregiver for all ALDs and performing all of transfers and lifting herself.  I will request palliative services as well as home PT, OT, speech therapy, and social worker.  I spent 30 minutes in face-to-face time with patient spent in counseling,  examination of the diagnosis, management, and coordination of care, independent of time spent during his electrodiagnostic testing.  Ilham Roughton K. Allena KatzPatel, DO

## 2016-07-21 ENCOUNTER — Other Ambulatory Visit: Payer: Self-pay | Admitting: *Deleted

## 2016-07-21 DIAGNOSIS — J99 Respiratory disorders in diseases classified elsewhere: Secondary | ICD-10-CM

## 2016-07-21 DIAGNOSIS — R0601 Orthopnea: Secondary | ICD-10-CM

## 2016-07-21 DIAGNOSIS — J988 Other specified respiratory disorders: Secondary | ICD-10-CM

## 2016-07-21 LAB — PARATHYROID HORMONE, INTACT (NO CA): PTH: 22 pg/mL (ref 14–64)

## 2016-07-24 ENCOUNTER — Telehealth: Payer: Self-pay | Admitting: Neurology

## 2016-07-24 ENCOUNTER — Telehealth: Payer: Self-pay | Admitting: *Deleted

## 2016-07-24 LAB — IMMUNOFIXATION ELECTROPHORESIS
IGM, SERUM: 93 mg/dL (ref 48–271)
IgA: 403 mg/dL (ref 81–463)
IgG (Immunoglobin G), Serum: 1037 mg/dL (ref 694–1618)

## 2016-07-24 LAB — PROTEIN ELECTROPHORESIS, SERUM
ALBUMIN ELP: 4.1 g/dL (ref 3.8–4.8)
Alpha-1-Globulin: 0.3 g/dL (ref 0.2–0.3)
Alpha-2-Globulin: 0.7 g/dL (ref 0.5–0.9)
BETA 2: 0.5 g/dL (ref 0.2–0.5)
BETA GLOBULIN: 0.5 g/dL (ref 0.4–0.6)
GAMMA GLOBULIN: 1 g/dL (ref 0.8–1.7)
TOTAL PROTEIN, SERUM ELECTROPHOR: 7.1 g/dL (ref 6.1–8.1)

## 2016-07-24 NOTE — Telephone Encounter (Signed)
PT's wife called and would like a call back

## 2016-07-24 NOTE — Telephone Encounter (Signed)
Patient's wife called.  She is asking for Dr. Riley KillSwartz to call back personally. Follow up appt is not until Aug 1st.  They have some questions and are hoping for some answers in regards to patient's care/  Apparently they spoke with Dr. Nita Sickleonika Patel and were told about pt's diagnosis of ALS, therapies were cancelled.  It seems that maybe a lot of information being absorbed quickly. Please call patient or instruct us on how to proceed with patient

## 2016-07-24 NOTE — Telephone Encounter (Signed)
Patient would like a second opinion [facility] with Dr. Blain Paisartwright.  Ok to send?

## 2016-07-24 NOTE — Telephone Encounter (Signed)
Yes, please send referral to [facility] for 2nd opinion at the ALS Clinic.

## 2016-07-25 ENCOUNTER — Encounter: Payer: Self-pay | Admitting: *Deleted

## 2016-07-25 ENCOUNTER — Ambulatory Visit: Payer: BLUE CROSS/BLUE SHIELD | Admitting: Physical Therapy

## 2016-07-25 LAB — COPPER, SERUM: Copper: 102 ug/dL (ref 70–175)

## 2016-07-25 NOTE — Telephone Encounter (Signed)
Left message for Steve Koch at [facility] clinic to call me back.

## 2016-07-26 ENCOUNTER — Telehealth: Payer: Self-pay | Admitting: *Deleted

## 2016-07-26 NOTE — Telephone Encounter (Signed)
Dr. Riley KillSwartz spoke with patient's wife over the phone

## 2016-07-26 NOTE — Telephone Encounter (Signed)
-----   Message from Glendale Chardonika K Patel, DO sent at 07/25/2016  8:04 AM EDT ----- Please notify patient labs are within normal limits. One of the antibody tests has been sent out and takes a little longer to result, we will let him know when it's back.  Thank you.

## 2016-07-26 NOTE — Telephone Encounter (Signed)
Wife called back. Steve Koch is wanting to know about his results. She said he knows they are normal but what does that mean? Please call. Thanks

## 2016-07-26 NOTE — Telephone Encounter (Signed)
Advised pt's wife to follow up with neuro at [facility] as advised. She may postpone follow up with me if this appt coincides with the date of his appt here as the ALS follow up is a priority now. Asked her to call me back with any questions.

## 2016-07-26 NOTE — Telephone Encounter (Signed)
Patient's wife given results.  

## 2016-07-27 ENCOUNTER — Telehealth: Payer: Self-pay | Admitting: *Deleted

## 2016-07-27 LAB — GM1 ANTIBODY IGG, IGM: GM 1 IgM: <1:100 {titer}

## 2016-07-27 NOTE — Telephone Encounter (Signed)
Left message informing patient that labs are normal.  

## 2016-07-27 NOTE — Telephone Encounter (Signed)
-----   Message from Van ClinesKaren M Aquino, MD sent at 07/27/2016  2:13 PM EDT ----- Pls let him know antibody test came back normal, thanks

## 2016-07-27 NOTE — Telephone Encounter (Signed)
Left message with Alvis LemmingsDawn that the labs are all within normal limits meaning nothing is abnormal with his blood.  Also informed her that Alcario Droughteresa Crews will be in touch to set up the [facility] appointment.

## 2016-07-31 ENCOUNTER — Telehealth: Payer: Self-pay | Admitting: Neurology

## 2016-07-31 NOTE — Telephone Encounter (Signed)
[facility] and she said that she has not heard from [facility] yet.  I called Aggie Cosierheresa and she scheduled them for July 26 at 3:30.  She will call Dawn and let her know.

## 2016-07-31 NOTE — Telephone Encounter (Signed)
Patient's wife Alvis LemmingsDawn called regarding a referral to [facility] for a 2nd opinion. Please call. Thanks

## 2016-07-31 NOTE — Telephone Encounter (Signed)
Called Dawn and left message for her to call me back.

## 2016-08-01 ENCOUNTER — Encounter: Payer: Self-pay | Admitting: Occupational Therapy

## 2016-08-04 ENCOUNTER — Encounter: Payer: Self-pay | Admitting: Pulmonary Disease

## 2016-08-04 ENCOUNTER — Ambulatory Visit (INDEPENDENT_AMBULATORY_CARE_PROVIDER_SITE_OTHER): Payer: BLUE CROSS/BLUE SHIELD | Admitting: Pulmonary Disease

## 2016-08-04 VITALS — BP 128/78 | HR 75 | Ht 73.0 in | Wt 250.0 lb

## 2016-08-04 DIAGNOSIS — G4730 Sleep apnea, unspecified: Secondary | ICD-10-CM | POA: Diagnosis not present

## 2016-08-04 DIAGNOSIS — G1221 Amyotrophic lateral sclerosis: Secondary | ICD-10-CM

## 2016-08-04 NOTE — Progress Notes (Signed)
   Subjective:    Patient ID: Andres ShadCharles Bhatt, male    DOB: [birth year], 56 y.o.   MRN: [id number]  HPI    Review of Systems  Constitutional: Negative for fever and unexpected weight change.  HENT: Positive for congestion. Negative for dental problem, ear pain, nosebleeds, postnasal drip, rhinorrhea, sinus pressure, sneezing, sore throat and trouble swallowing.   Eyes: Negative for redness and itching.  Respiratory: Positive for cough. Negative for chest tightness, shortness of breath and wheezing.   Cardiovascular: Negative for palpitations and leg swelling.  Gastrointestinal: Negative for nausea and vomiting.  Genitourinary: Negative for dysuria.  Musculoskeletal: Positive for joint swelling.  Skin: Negative for rash.  Neurological: Negative for headaches.  Hematological: Does not bruise/bleed easily.  Psychiatric/Behavioral: Negative for dysphoric mood. The patient is not nervous/anxious.        Objective:   Physical Exam        Assessment & Plan:

## 2016-08-04 NOTE — Progress Notes (Signed)
Past surgical history He  has a past surgical history that includes Back surgery (1995); Hand surgery (Left); Knee arthroscopy (Right); Hernia repair (Right); Colonoscopy; Anterior cervical decomp/discectomy fusion (N/A, 03/09/2016); and Neck surgery (03/09/2016).  Family history His family history includes Cancer in his father; Diabetes in his brother and father; Healthy in his daughter and son; Hypertension in his mother; Pneumonia in his mother.  Social history He  reports that he quit smoking about 9 years ago. His smoking use included Cigarettes. He has a 8.00 pack-year smoking history. He has never used smokeless tobacco. He reports that he drinks alcohol. He reports that he does not use drugs.  Allergies  Allergen Reactions  . Lyrica [Pregabalin] Swelling   Review of Systems  Constitutional: Negative for fever and unexpected weight change.  HENT: Positive for congestion. Negative for dental problem, ear pain, nosebleeds, postnasal drip, rhinorrhea, sinus pressure, sneezing, sore throat and trouble swallowing.   Eyes: Negative for redness and itching.  Respiratory: Positive for cough. Negative for chest tightness, shortness of breath and wheezing.   Cardiovascular: Negative for palpitations and leg swelling.  Gastrointestinal: Negative for nausea and vomiting.  Genitourinary: Negative for dysuria.  Musculoskeletal: Positive for joint swelling.  Skin: Negative for rash.  Neurological: Negative for headaches.  Hematological: Does not bruise/bleed easily.  Psychiatric/Behavioral: Negative for dysphoric mood. The patient is not nervous/anxious.    Current Outpatient Prescriptions on File Prior to Visit  Medication Sig  . amLODipine (NORVASC) 2.5 MG tablet Take 1 tablet (2.5 mg total) by mouth daily.  Marland Kitchen aspirin EC 81 MG tablet Take 81 mg by mouth daily.  . baclofen (LIORESAL) 10 MG tablet Take 1 tablet (10 mg total) by mouth 4 (four) times daily.  . fluticasone (FLONASE) 50 MCG/ACT  nasal spray Place 1 spray into both nostrils daily.  . pantoprazole (PROTONIX) 40 MG tablet Take 1 tablet (40 mg total) by mouth daily.  . traZODone (DESYREL) 50 MG tablet Take 1 tablet (50 mg total) by mouth at bedtime as needed for sleep.  . vitamin B-12 500 MCG tablet Take 1 tablet (500 mcg total) by mouth daily.   No current facility-administered medications on file prior to visit.     Chief Complaint  Patient presents with  . PULMONARY CONSULT    Pt is believed to have beginning stages of ALS - gradually increasing. Pt has coughing x 2 weeks and congestion that he is unable to get up.     Neurology tests MRI brain 07/13/16 >> abnormal T2 and FLAIR affecting precentral gyri in both corticospinal tracts symmetrically EMG 07/20/16 > widespread disorder of anterior horn cells  Past medical history He  has a past medical history of Anxiety; Carpal tunnel syndrome; GERD (gastroesophageal reflux disease); Hypertension; and PONV (postoperative nausea and vomiting).  Vital signs BP 128/78 (BP Location: Left Arm, Cuff Size: Normal)   Pulse 75   Ht 6\' 1"  (1.854 m)   Wt 250 lb (113.4 kg)   SpO2 96%   BMI 32.98 kg/m   History of present illness Steve Koch is a 56 y.o. male with progressive muscle weakness.  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  He didn't have any issue with his muscle strength as of September 2017.  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  He was able to continue working.  Several weeks after this he started noticing trouble with his hand strength.  He was eventually referred to Dr.  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  He had ACDF in February 2018.  He reports that he had initial improvement in his strength and was progressing with physical therapy.  In March 2018 he started noticing trouble with weakness and started falling.  His weakness has diffuse, symmetric involvement.  He is having more trouble with his  speech and gets into coughing spells when he talks too much.  He has not noticed any difficulty with his swallowing.  He denies fever, chest pain, wheeze, or sputum production.  He was eventually seen by Dr. Allena KatzPatel.  He had MRI brain and EMG and these findings were consistent with ALS.   Physical exam  General - sitting in wheelchair ENT - weak voice, he is able to cough, no oral exudate, no LAN Cardiac - s1s2 regular, no murmur, pulses symmetric Chest - no wheeze/rales, adequate respiratory excursion Back - No focal tenderness Abd - Soft, non-tender, no organomegaly, + bowel sounds Ext - No edema Neuro - diffuse weakness, he is able to move left arm Skin - No rashes Psych - Normal mood, and behavior   CMP Latest Ref Rng & Units 07/12/2016 07/05/2016 06/28/2016  Glucose 65 - 99 mg/dL 94 161(W102(H) 960(A105(H)  BUN 6 - 20 mg/dL 13 15 16   Creatinine 0.61 - 1.24 mg/dL 5.400.86 9.810.92 1.910.88  Sodium 135 - 145 mmol/L 141 141 137  Potassium 3.5 - 5.1 mmol/L 4.4 4.7 4.6  Chloride 101 - 111 mmol/L 106 105 103  CO2 22 - 32 mmol/L 27 29 25   Calcium 8.9 - 10.3 mg/dL 9.5 9.3 4.7(W8.8(L)  Total Protein 6.5 - 8.1 g/dL - - -  Total Bilirubin 0.3 - 1.2 mg/dL - - -  Alkaline Phos 38 - 126 U/L - - -  AST 15 - 41 U/L - - -  ALT 17 - 63 U/L - - -     CBC Latest Ref Rng & Units 07/12/2016 07/05/2016 06/28/2016  WBC 4.0 - 10.5 K/uL 9.6 9.8 9.0  Hemoglobin 13.0 - 17.0 g/dL 29.515.7 62.115.0 30.814.5  Hematocrit 39.0 - 52.0 % 44.1 43.3 41.2  Platelets 150 - 400 K/uL 238 282 266    Discussion 56 yo male with progressive muscle weakness over the past several months.  He might have experienced two different processes simultaneously >> cervical myelopathy and development of amyotrophic lateral sclerosis.  I think his current issues are mostly related to his ALS.  We had a detailed discussion about the natural progression of ALS and how this can impact his respiratory muscles.  We also discussed eventual need for assisted ventilation.  He  would be accepting of supplemental oxygen and/or PAP therapy if needed.  At this time he would be reluctant to consider tracheostomy and long term ventilator support.  Plan Will arrange for in lab sleep study to further assess for sleep disordered breathing Defer pulmonary function testing >> I don't think he would be able to adequately perform test maneuvers and test results wouldn't impact treatment options at this point Consider chest imaging studies if he has more respiratory symptoms He will [facility] a second [facility]   Patient Instructions  Will arrange for in lab sleep study  Follow up in 3 months    Coralyn HellingVineet Francisca Langenderfer, MD [place] Pager:  [phone] 08/04/2016, 2:43 PM

## 2016-08-04 NOTE — Patient Instructions (Signed)
Will arrange for in lab sleep study  Follow up in 3 months 

## 2016-08-08 ENCOUNTER — Telehealth: Payer: Self-pay | Admitting: Pulmonary Disease

## 2016-08-08 DIAGNOSIS — G4730 Sleep apnea, unspecified: Secondary | ICD-10-CM

## 2016-08-08 DIAGNOSIS — G1221 Amyotrophic lateral sclerosis: Secondary | ICD-10-CM

## 2016-08-08 NOTE — Telephone Encounter (Signed)
Dr. Craige CottaSood, are you ok with us changing the order to a split night study?   Please advise. Thanks.

## 2016-08-09 NOTE — Telephone Encounter (Signed)
That is fine 

## 2016-08-09 NOTE — Telephone Encounter (Signed)
Order for split night has been ordered. Terri with [facility] has been made aware. Nothing further needed.

## 2016-08-13 ENCOUNTER — Other Ambulatory Visit: Payer: Self-pay | Admitting: Physical Medicine and Rehabilitation

## 2016-08-16 ENCOUNTER — Encounter: Payer: BLUE CROSS/BLUE SHIELD | Admitting: Physical Medicine & Rehabilitation

## 2016-10-03 ENCOUNTER — Other Ambulatory Visit (HOSPITAL_BASED_OUTPATIENT_CLINIC_OR_DEPARTMENT_OTHER): Payer: Self-pay

## 2016-10-03 DIAGNOSIS — G4730 Sleep apnea, unspecified: Secondary | ICD-10-CM

## 2016-10-03 DIAGNOSIS — G1221 Amyotrophic lateral sclerosis: Secondary | ICD-10-CM

## 2016-11-03 ENCOUNTER — Ambulatory Visit: Payer: Self-pay | Admitting: Pulmonary Disease

## 2017-01-07 ENCOUNTER — Other Ambulatory Visit: Payer: Self-pay

## 2017-01-07 ENCOUNTER — Encounter (HOSPITAL_COMMUNITY): Payer: Self-pay | Admitting: *Deleted

## 2017-01-07 ENCOUNTER — Inpatient Hospital Stay (HOSPITAL_COMMUNITY)
Admission: EM | Admit: 2017-01-07 | Discharge: 2017-01-13 | DRG: 871 | Disposition: A | Discharge status: Hospice | Payer: Medicare Other | Attending: Internal Medicine | Admitting: Internal Medicine

## 2017-01-07 DIAGNOSIS — F411 Generalized anxiety disorder: Secondary | ICD-10-CM | POA: Diagnosis present

## 2017-01-07 DIAGNOSIS — B370 Candidal stomatitis: Secondary | ICD-10-CM | POA: Diagnosis present

## 2017-01-07 DIAGNOSIS — Z515 Encounter for palliative care: Secondary | ICD-10-CM | POA: Diagnosis not present

## 2017-01-07 DIAGNOSIS — G8250 Quadriplegia, unspecified: Secondary | ICD-10-CM | POA: Diagnosis present

## 2017-01-07 DIAGNOSIS — E860 Dehydration: Secondary | ICD-10-CM | POA: Diagnosis present

## 2017-01-07 DIAGNOSIS — I10 Essential (primary) hypertension: Secondary | ICD-10-CM | POA: Diagnosis present

## 2017-01-07 DIAGNOSIS — R0603 Acute respiratory distress: Secondary | ICD-10-CM | POA: Diagnosis not present

## 2017-01-07 DIAGNOSIS — R310 Gross hematuria: Secondary | ICD-10-CM

## 2017-01-07 DIAGNOSIS — A4151 Sepsis due to Escherichia coli [E. coli]: Principal | ICD-10-CM | POA: Diagnosis present

## 2017-01-07 DIAGNOSIS — K317 Polyp of stomach and duodenum: Secondary | ICD-10-CM | POA: Diagnosis present

## 2017-01-07 DIAGNOSIS — G1221 Amyotrophic lateral sclerosis: Secondary | ICD-10-CM | POA: Diagnosis present

## 2017-01-07 DIAGNOSIS — Z7951 Long term (current) use of inhaled steroids: Secondary | ICD-10-CM

## 2017-01-07 DIAGNOSIS — Z6827 Body mass index (BMI) 27.0-27.9, adult: Secondary | ICD-10-CM

## 2017-01-07 DIAGNOSIS — J690 Pneumonitis due to inhalation of food and vomit: Secondary | ICD-10-CM

## 2017-01-07 DIAGNOSIS — R000 Tachycardia, unspecified: Secondary | ICD-10-CM | POA: Diagnosis present

## 2017-01-07 DIAGNOSIS — Z79899 Other long term (current) drug therapy: Secondary | ICD-10-CM

## 2017-01-07 DIAGNOSIS — R1312 Dysphagia, oropharyngeal phase: Secondary | ICD-10-CM

## 2017-01-07 DIAGNOSIS — Z7189 Other specified counseling: Secondary | ICD-10-CM

## 2017-01-07 DIAGNOSIS — Z8249 Family history of ischemic heart disease and other diseases of the circulatory system: Secondary | ICD-10-CM

## 2017-01-07 DIAGNOSIS — N390 Urinary tract infection, site not specified: Secondary | ICD-10-CM

## 2017-01-07 DIAGNOSIS — A419 Sepsis, unspecified organism: Secondary | ICD-10-CM

## 2017-01-07 DIAGNOSIS — K5900 Constipation, unspecified: Secondary | ICD-10-CM | POA: Diagnosis present

## 2017-01-07 DIAGNOSIS — I471 Supraventricular tachycardia: Secondary | ICD-10-CM | POA: Diagnosis present

## 2017-01-07 DIAGNOSIS — Z7401 Bed confinement status: Secondary | ICD-10-CM

## 2017-01-07 DIAGNOSIS — Z7982 Long term (current) use of aspirin: Secondary | ICD-10-CM

## 2017-01-07 DIAGNOSIS — Z66 Do not resuscitate: Secondary | ICD-10-CM | POA: Diagnosis present

## 2017-01-07 DIAGNOSIS — R0689 Other abnormalities of breathing: Secondary | ICD-10-CM

## 2017-01-07 DIAGNOSIS — R634 Abnormal weight loss: Secondary | ICD-10-CM

## 2017-01-07 DIAGNOSIS — Z87891 Personal history of nicotine dependence: Secondary | ICD-10-CM

## 2017-01-07 DIAGNOSIS — R6520 Severe sepsis without septic shock: Secondary | ICD-10-CM | POA: Diagnosis present

## 2017-01-07 DIAGNOSIS — E538 Deficiency of other specified B group vitamins: Secondary | ICD-10-CM | POA: Diagnosis present

## 2017-01-07 DIAGNOSIS — K219 Gastro-esophageal reflux disease without esophagitis: Secondary | ICD-10-CM | POA: Diagnosis present

## 2017-01-07 DIAGNOSIS — E440 Moderate protein-calorie malnutrition: Secondary | ICD-10-CM | POA: Diagnosis present

## 2017-01-07 DIAGNOSIS — T17908A Unspecified foreign body in respiratory tract, part unspecified causing other injury, initial encounter: Secondary | ICD-10-CM

## 2017-01-07 DIAGNOSIS — R319 Hematuria, unspecified: Secondary | ICD-10-CM

## 2017-01-07 HISTORY — DX: Deficiency of other specified B group vitamins: E53.8

## 2017-01-07 HISTORY — DX: Amyotrophic lateral sclerosis: G12.21

## 2017-01-07 MED ORDER — SODIUM CHLORIDE 0.9 % IV BOLUS (SEPSIS)
500.0000 mL | Freq: Once | INTRAVENOUS | Status: AC
  Administered 2017-01-08: 500 mL via INTRAVENOUS

## 2017-01-07 MED ORDER — CEFTRIAXONE SODIUM 2 G IJ SOLR
2.0000 g | Freq: Once | INTRAMUSCULAR | Status: AC
  Administered 2017-01-08: 2 g via INTRAVENOUS
  Filled 2017-01-07: qty 2

## 2017-01-07 MED ORDER — SODIUM CHLORIDE 0.9 % IV BOLUS (SEPSIS)
1000.0000 mL | Freq: Once | INTRAVENOUS | Status: AC
  Administered 2017-01-08: 1000 mL via INTRAVENOUS

## 2017-01-07 MED ORDER — SODIUM CHLORIDE 0.9 % IV BOLUS (SEPSIS)
1000.0000 mL | Freq: Once | INTRAVENOUS | Status: AC
  Administered 2017-01-07: 1000 mL via INTRAVENOUS

## 2017-01-07 NOTE — ED Triage Notes (Signed)
Pt's family member states pt began running a fever today with blood in urine and had one episode of n/v last night; wife states pt has been having episodes of urinary frequency with oliguria today; pt denies any pain at this time

## 2017-01-08 ENCOUNTER — Other Ambulatory Visit: Payer: Self-pay

## 2017-01-08 ENCOUNTER — Inpatient Hospital Stay (HOSPITAL_COMMUNITY): Payer: Medicare Other

## 2017-01-08 ENCOUNTER — Encounter (HOSPITAL_COMMUNITY): Payer: Self-pay | Admitting: Internal Medicine

## 2017-01-08 DIAGNOSIS — Z79899 Other long term (current) drug therapy: Secondary | ICD-10-CM | POA: Diagnosis not present

## 2017-01-08 DIAGNOSIS — R000 Tachycardia, unspecified: Secondary | ICD-10-CM | POA: Diagnosis not present

## 2017-01-08 DIAGNOSIS — G8250 Quadriplegia, unspecified: Secondary | ICD-10-CM | POA: Diagnosis present

## 2017-01-08 DIAGNOSIS — I471 Supraventricular tachycardia: Secondary | ICD-10-CM | POA: Diagnosis present

## 2017-01-08 DIAGNOSIS — N390 Urinary tract infection, site not specified: Secondary | ICD-10-CM

## 2017-01-08 DIAGNOSIS — Z87891 Personal history of nicotine dependence: Secondary | ICD-10-CM | POA: Diagnosis not present

## 2017-01-08 DIAGNOSIS — B370 Candidal stomatitis: Secondary | ICD-10-CM | POA: Diagnosis present

## 2017-01-08 DIAGNOSIS — Z7982 Long term (current) use of aspirin: Secondary | ICD-10-CM | POA: Diagnosis not present

## 2017-01-08 DIAGNOSIS — K219 Gastro-esophageal reflux disease without esophagitis: Secondary | ICD-10-CM | POA: Diagnosis present

## 2017-01-08 DIAGNOSIS — E440 Moderate protein-calorie malnutrition: Secondary | ICD-10-CM | POA: Diagnosis present

## 2017-01-08 DIAGNOSIS — R310 Gross hematuria: Secondary | ICD-10-CM | POA: Diagnosis present

## 2017-01-08 DIAGNOSIS — Z8249 Family history of ischemic heart disease and other diseases of the circulatory system: Secondary | ICD-10-CM | POA: Diagnosis not present

## 2017-01-08 DIAGNOSIS — R9431 Abnormal electrocardiogram [ECG] [EKG]: Secondary | ICD-10-CM

## 2017-01-08 DIAGNOSIS — G1221 Amyotrophic lateral sclerosis: Secondary | ICD-10-CM | POA: Diagnosis present

## 2017-01-08 DIAGNOSIS — Z7951 Long term (current) use of inhaled steroids: Secondary | ICD-10-CM | POA: Diagnosis not present

## 2017-01-08 DIAGNOSIS — R6520 Severe sepsis without septic shock: Secondary | ICD-10-CM | POA: Diagnosis present

## 2017-01-08 DIAGNOSIS — Z66 Do not resuscitate: Secondary | ICD-10-CM | POA: Diagnosis present

## 2017-01-08 DIAGNOSIS — R1312 Dysphagia, oropharyngeal phase: Secondary | ICD-10-CM | POA: Diagnosis not present

## 2017-01-08 DIAGNOSIS — R634 Abnormal weight loss: Secondary | ICD-10-CM | POA: Diagnosis not present

## 2017-01-08 DIAGNOSIS — T17908S Unspecified foreign body in respiratory tract, part unspecified causing other injury, sequela: Secondary | ICD-10-CM | POA: Diagnosis not present

## 2017-01-08 DIAGNOSIS — K5900 Constipation, unspecified: Secondary | ICD-10-CM | POA: Diagnosis present

## 2017-01-08 DIAGNOSIS — A4151 Sepsis due to Escherichia coli [E. coli]: Secondary | ICD-10-CM | POA: Diagnosis present

## 2017-01-08 DIAGNOSIS — Z6827 Body mass index (BMI) 27.0-27.9, adult: Secondary | ICD-10-CM | POA: Diagnosis not present

## 2017-01-08 DIAGNOSIS — K317 Polyp of stomach and duodenum: Secondary | ICD-10-CM | POA: Diagnosis present

## 2017-01-08 DIAGNOSIS — E538 Deficiency of other specified B group vitamins: Secondary | ICD-10-CM | POA: Diagnosis present

## 2017-01-08 DIAGNOSIS — R1310 Dysphagia, unspecified: Secondary | ICD-10-CM | POA: Diagnosis not present

## 2017-01-08 DIAGNOSIS — F411 Generalized anxiety disorder: Secondary | ICD-10-CM | POA: Diagnosis present

## 2017-01-08 DIAGNOSIS — A419 Sepsis, unspecified organism: Secondary | ICD-10-CM | POA: Diagnosis present

## 2017-01-08 DIAGNOSIS — E860 Dehydration: Secondary | ICD-10-CM | POA: Diagnosis present

## 2017-01-08 DIAGNOSIS — J690 Pneumonitis due to inhalation of food and vomit: Secondary | ICD-10-CM | POA: Diagnosis present

## 2017-01-08 DIAGNOSIS — R319 Hematuria, unspecified: Secondary | ICD-10-CM | POA: Diagnosis not present

## 2017-01-08 DIAGNOSIS — R0689 Other abnormalities of breathing: Secondary | ICD-10-CM | POA: Diagnosis not present

## 2017-01-08 DIAGNOSIS — I10 Essential (primary) hypertension: Secondary | ICD-10-CM | POA: Diagnosis present

## 2017-01-08 DIAGNOSIS — Z515 Encounter for palliative care: Secondary | ICD-10-CM | POA: Diagnosis not present

## 2017-01-08 DIAGNOSIS — Z7189 Other specified counseling: Secondary | ICD-10-CM | POA: Diagnosis not present

## 2017-01-08 LAB — ECHOCARDIOGRAM COMPLETE
AVLVOTPG: 9 mmHg
CHL CUP MV DEC (S): 317
E decel time: 317 msec
FS: 41 % (ref 28–44)
HEIGHTINCHES: 73 in
IVS/LV PW RATIO, ED: 1.3
LA ID, A-P, ES: 38 mm
LA diam end sys: 38 mm
LA vol index: 18.5 mL/m2
LA vol: 40.8 mL
LADIAMINDEX: 1.72 cm/m2
LAVOLA4C: 38.7 mL
LV dias vol index: 42 mL/m2
LV dias vol: 93 mL (ref 62–150)
LVOT SV: 66 mL
LVOT VTI: 19.2 cm
LVOT area: 3.46 cm2
LVOT diameter: 21 mm
LVOT peak vel: 149 cm/s
LVSYSVOL: 33 mL
LVSYSVOLIN: 15 mL/m2
MV pk E vel: 70.6 m/s
MVPKAVEL: 86 m/s
PW: 10.2 mm — AB (ref 0.6–1.1)
RV LATERAL S' VELOCITY: 16.1 cm/s
RV TAPSE: 19.5 mm
Simpson's disk: 64
Stroke v: 60 ml
WEIGHTICAEL: 3301.61 [oz_av]

## 2017-01-08 LAB — CBC WITH DIFFERENTIAL/PLATELET
BASOS PCT: 0 %
BASOS PCT: 0 %
Basophils Absolute: 0 10*3/uL (ref 0.0–0.1)
Basophils Absolute: 0 10*3/uL (ref 0.0–0.1)
EOS ABS: 0 10*3/uL (ref 0.0–0.7)
Eosinophils Absolute: 0 10*3/uL (ref 0.0–0.7)
Eosinophils Relative: 0 %
Eosinophils Relative: 0 %
HCT: 41.7 % (ref 39.0–52.0)
HEMATOCRIT: 40.1 % (ref 39.0–52.0)
HEMOGLOBIN: 13.9 g/dL (ref 13.0–17.0)
HEMOGLOBIN: 14.7 g/dL (ref 13.0–17.0)
LYMPHS ABS: 1.2 10*3/uL (ref 0.7–4.0)
Lymphocytes Relative: 6 %
Lymphocytes Relative: 7 %
Lymphs Abs: 1.6 10*3/uL (ref 0.7–4.0)
MCH: 29.7 pg (ref 26.0–34.0)
MCH: 30.2 pg (ref 26.0–34.0)
MCHC: 34.7 g/dL (ref 30.0–36.0)
MCHC: 35.3 g/dL (ref 30.0–36.0)
MCV: 85.6 fL (ref 78.0–100.0)
MCV: 85.7 fL (ref 78.0–100.0)
MONOS PCT: 5 %
MONOS PCT: 5 %
Monocytes Absolute: 0.9 10*3/uL (ref 0.1–1.0)
Monocytes Absolute: 1.3 10*3/uL — ABNORMAL HIGH (ref 0.1–1.0)
NEUTROS ABS: 18.7 10*3/uL — AB (ref 1.7–7.7)
NEUTROS PCT: 88 %
NEUTROS PCT: 89 %
Neutro Abs: 20.9 10*3/uL — ABNORMAL HIGH (ref 1.7–7.7)
Platelets: 185 10*3/uL (ref 150–400)
Platelets: 221 10*3/uL (ref 150–400)
RBC: 4.68 MIL/uL (ref 4.22–5.81)
RBC: 4.87 MIL/uL (ref 4.22–5.81)
RDW: 14.5 % (ref 11.5–15.5)
RDW: 14.5 % (ref 11.5–15.5)
WBC: 20.8 10*3/uL — ABNORMAL HIGH (ref 4.0–10.5)
WBC: 23.8 10*3/uL — ABNORMAL HIGH (ref 4.0–10.5)

## 2017-01-08 LAB — COMPREHENSIVE METABOLIC PANEL
ALT: 22 U/L (ref 17–63)
AST: 17 U/L (ref 15–41)
Albumin: 3.8 g/dL (ref 3.5–5.0)
Alkaline Phosphatase: 77 U/L (ref 38–126)
Anion gap: 11 (ref 5–15)
BILIRUBIN TOTAL: 1 mg/dL (ref 0.3–1.2)
BUN: 9 mg/dL (ref 6–20)
CALCIUM: 9 mg/dL (ref 8.9–10.3)
CO2: 25 mmol/L (ref 22–32)
CREATININE: 0.48 mg/dL — AB (ref 0.61–1.24)
Chloride: 105 mmol/L (ref 101–111)
Glucose, Bld: 125 mg/dL — ABNORMAL HIGH (ref 65–99)
Potassium: 4.4 mmol/L (ref 3.5–5.1)
Sodium: 141 mmol/L (ref 135–145)
TOTAL PROTEIN: 7.4 g/dL (ref 6.5–8.1)

## 2017-01-08 LAB — BASIC METABOLIC PANEL
Anion gap: 11 (ref 5–15)
BUN: 7 mg/dL (ref 6–20)
CHLORIDE: 104 mmol/L (ref 101–111)
CO2: 23 mmol/L (ref 22–32)
CREATININE: 0.48 mg/dL — AB (ref 0.61–1.24)
Calcium: 8.4 mg/dL — ABNORMAL LOW (ref 8.9–10.3)
GFR calc non Af Amer: >60 mL/min (ref >60)
Glucose, Bld: 113 mg/dL — ABNORMAL HIGH (ref 65–99)
POTASSIUM: 3.6 mmol/L (ref 3.5–5.1)
Sodium: 138 mmol/L (ref 135–145)

## 2017-01-08 LAB — URINALYSIS, ROUTINE W REFLEX MICROSCOPIC
BILIRUBIN URINE: NEGATIVE
GLUCOSE, UA: NEGATIVE mg/dL
KETONES UR: 80 mg/dL — AB
Nitrite: POSITIVE — AB
PROTEIN: 100 mg/dL — AB
Specific Gravity, Urine: 1.024 (ref 1.005–1.030)
pH: 6 (ref 5.0–8.0)

## 2017-01-08 LAB — PHOSPHORUS
PHOSPHORUS: 2.6 mg/dL (ref 2.5–4.6)
PHOSPHORUS: 2.5 mg/dL (ref 2.5–4.6)

## 2017-01-08 LAB — MAGNESIUM
MAGNESIUM: 1.9 mg/dL (ref 1.7–2.4)
MAGNESIUM: 1.7 mg/dL (ref 1.7–2.4)

## 2017-01-08 LAB — LACTIC ACID, PLASMA: Lactic Acid, Venous: 1.1 mmol/L (ref 0.5–1.9)

## 2017-01-08 MED ORDER — LORAZEPAM 2 MG/ML IJ SOLN
0.5000 mg | Freq: Once | INTRAMUSCULAR | Status: AC
  Administered 2017-01-08: 0.5 mg via INTRAVENOUS
  Filled 2017-01-08: qty 1

## 2017-01-08 MED ORDER — DEXTROSE 5 % IV SOLN
2.0000 g | INTRAVENOUS | Status: DC
  Administered 2017-01-09 – 2017-01-12 (×5): 2 g via INTRAVENOUS
  Filled 2017-01-08 (×7): qty 2

## 2017-01-08 MED ORDER — BACLOFEN 10 MG PO TABS
10.0000 mg | ORAL_TABLET | Freq: Two times a day (BID) | ORAL | Status: DC
  Filled 2017-01-08 (×2): qty 1

## 2017-01-08 MED ORDER — ZOLPIDEM TARTRATE 5 MG PO TABS
10.0000 mg | ORAL_TABLET | Freq: Every evening | ORAL | Status: DC | PRN
Start: 2017-01-08 — End: 2017-01-13
  Administered 2017-01-08 – 2017-01-12 (×2): 10 mg via ORAL
  Filled 2017-01-08 (×2): qty 2

## 2017-01-08 MED ORDER — KETOROLAC TROMETHAMINE 30 MG/ML IJ SOLN
30.0000 mg | Freq: Once | INTRAMUSCULAR | Status: AC
  Administered 2017-01-08: 30 mg via INTRAVENOUS
  Filled 2017-01-08: qty 1

## 2017-01-08 MED ORDER — PANTOPRAZOLE SODIUM 40 MG PO TBEC: 40.0000 mg | DELAYED_RELEASE_TABLET | Freq: Every day | ORAL | Status: DC

## 2017-01-08 MED ORDER — SODIUM CHLORIDE 0.9 % IV SOLN
INTRAVENOUS | Status: DC
  Administered 2017-01-08 – 2017-01-09 (×5): via INTRAVENOUS
  Administered 2017-01-09: 1 mL via INTRAVENOUS
  Administered 2017-01-10 – 2017-01-11 (×5): via INTRAVENOUS

## 2017-01-08 MED ORDER — ORAL CARE MOUTH RINSE
15.0000 mL | Freq: Two times a day (BID) | OROMUCOSAL | Status: DC
  Administered 2017-01-08 – 2017-01-13 (×10): 15 mL via OROMUCOSAL

## 2017-01-08 MED ORDER — MAGNESIUM SULFATE 2 GM/50ML IV SOLN
2.0000 g | Freq: Once | INTRAVENOUS | Status: AC
  Administered 2017-01-08: 2 g via INTRAVENOUS
  Filled 2017-01-08: qty 50

## 2017-01-08 MED ORDER — ENOXAPARIN SODIUM 40 MG/0.4ML ~~LOC~~ SOLN
40.0000 mg | SUBCUTANEOUS | Status: AC
  Administered 2017-01-08 – 2017-01-09 (×2): 40 mg via SUBCUTANEOUS
  Filled 2017-01-08 (×2): qty 0.4

## 2017-01-08 MED ORDER — METRONIDAZOLE IN NACL 5-0.79 MG/ML-% IV SOLN
500.0000 mg | Freq: Three times a day (TID) | INTRAVENOUS | Status: DC
  Administered 2017-01-08 – 2017-01-13 (×15): 500 mg via INTRAVENOUS
  Filled 2017-01-08 (×15): qty 100

## 2017-01-08 MED ORDER — ASPIRIN EC 81 MG PO TBEC: 81.0000 mg | DELAYED_RELEASE_TABLET | Freq: Every day | ORAL | Status: DC

## 2017-01-08 MED ORDER — ACETAMINOPHEN 650 MG RE SUPP
650.0000 mg | Freq: Once | RECTAL | Status: AC
  Administered 2017-01-08: 650 mg via RECTAL
  Filled 2017-01-08: qty 1

## 2017-01-08 MED ORDER — POTASSIUM CHLORIDE 10 MEQ/100ML IV SOLN
10.0000 meq | Freq: Once | INTRAVENOUS | Status: AC
  Administered 2017-01-08: 10 meq via INTRAVENOUS
  Filled 2017-01-08: qty 100

## 2017-01-08 MED ORDER — ACETAMINOPHEN 650 MG RE SUPP
650.0000 mg | RECTAL | Status: DC | PRN
  Administered 2017-01-08: 650 mg via RECTAL
  Filled 2017-01-08: qty 1

## 2017-01-08 MED ORDER — PANTOPRAZOLE SODIUM 40 MG IV SOLR
40.0000 mg | INTRAVENOUS | Status: DC
  Administered 2017-01-08 – 2017-01-13 (×6): 40 mg via INTRAVENOUS
  Filled 2017-01-08 (×6): qty 40

## 2017-01-08 NOTE — Progress Notes (Signed)
1537 Patient evaluated by SLP, noted tolerating pureed/soft foods well but still exhibits symptoms of dysphagia. Lung sounds clear/dim with rhonchi noted. CXR recommended, MD notified.

## 2017-01-08 NOTE — ED Notes (Addendum)
Pt voided Bladder Scan: 72 mL

## 2017-01-08 NOTE — ED Notes (Signed)
Ultrasound at bedside, Nurse Ron trying to get another IV access at this time.

## 2017-01-08 NOTE — Progress Notes (Signed)
01/07/2017 11:16 PM  01/08/2017 7:07 AM  Steve Koch was seen and examined.  The H&P by the admitting provider, orders, imaging was reviewed.  Please see new orders.  Will continue to follow.   Maryln Manuel. Tehilla Coffel, MD [facility]

## 2017-01-08 NOTE — Progress Notes (Signed)
Patient seen by Dr.Rourk (GI), plan to proceed with PEG tube placement per wife/patient request, patient's wife signed consent and consent placed in patient's chart.

## 2017-01-08 NOTE — Consult Note (Signed)
Referring Provider: Cleora FleetJohnson, Clanford L, MD Primary Care Physician:  [facility], [facility] Primary Gastroenterologist:  Roetta SessionsMichael Rourk, MD   Reason for Consultation:  PEG placement  HPI: Steve ShadCharles Koch is a 56 y.o. male with ALS, anxiety, GERD who was brought to the emergency department by family members due to fever, hematuria, nausea with vomiting, urinary symptoms.  Patient became ill a couple of days ago but resisted coming to the ED.  Finally after multiple request by family members he agreed.  In the ED his creatinine was 0.48, BUN 9.  LFTs normal.  White blood cell count 23,800, normal hemoglobin and platelets.  Lactic acid 1.1.  Urinalysis with large hemoglobin, ketones 80, nitrates positive, large leukocytes.  Urine culture pending.  Blood cultures pending.  Today's white blood cell count is down to 20,000.  Patient diagnosed with ALS earlier this year in the last 6 months has had rapid decline.  He has lost 40 pounds.  According to patient's wife it takes him an extreme amount of time to eat.  He becomes exhausted.  They have been contemplating feeding tube after discussion with the neurologist last month.  They do not go back to see neurologist, Dr. Alphonzo Dublinaress until February.  Patient's wife has been in contact with palliative care.  It is been determined that patient can have PEG placement and still [facility].  Patient and wife both are interested in PEG placement during this admission if possible.  Has been seen by speech therapy here.  Discussed personally with Ms. Hale BogusPorter.  Patient risk of aspiration.  Patient had evaluation by speech therapy through the ALS clinic, following impressions as noted below.  Recommended for instrumental swallowing study to rule out aspiration this was not to be done locally.  "IMPRESSIONS: Patient presents with speech and swallowing function which have worsened since his last visit. He coughed on thin  liquids today and does not have a very strong cough response. He is no longer able to sip out of a straw and it is difficult for his wife to tell how much he is getting when she holds the cup for him. He has not decided on a feeding tube option."  Patient has had some issues with constipation as an outpatient.  Have utilize Senokot but does not have good stool return, passes mucus only.  No melena or rectal bleeding.  No vomiting except for with recent illness.   Prior to Admission medications   Medication Sig Start Date End Date Taking? Authorizing Provider  aspirin EC 81 MG tablet Take 81 mg by mouth daily.   Yes [provider]  baclofen (LIORESAL) 10 MG tablet Take 1 tablet (10 mg total) by mouth 4 (four) times daily. Patient taking differently: Take 10 mg by mouth 2 (two) times daily. At bedtime 07/14/16  Yes Love, Evlyn Kanneramela S, PA-C  omeprazole (PRILOSEC) 40 MG capsule Take 40 mg by mouth daily.   Yes [provider]  PARoxetine (PAXIL) 20 MG tablet Take 20 mg by mouth daily.   Yes [provider]  vitamin B-12 500 MCG tablet Take 1 tablet (500 mcg total) by mouth daily. 07/15/16  Yes Love, Evlyn KannerPamela S, PA-C  zolpidem (AMBIEN) 10 MG tablet Take 10 mg by mouth at bedtime as needed for sleep.   Yes [provider]  amLODipine (NORVASC) 2.5 MG tablet Take 1 tablet (2.5 mg total) by mouth daily. 07/14/16   Love, Evlyn KannerPamela S, PA-C  fluticasone (  FLONASE) 50 MCG/ACT nasal spray Place 1 spray into both nostrils daily. 07/15/16   Love, Evlyn Kanner, PA-C  traZODone (DESYREL) 50 MG tablet Take 1 tablet (50 mg total) by mouth at bedtime as needed for sleep. 07/14/16   Jacquelynn Cree, PA-C    Current Facility-Administered Medications  Medication Dose Route Frequency Provider Last Rate Last Dose  . 0.9 %  sodium chloride infusion   Intravenous Continuous Johnson, Clanford L, MD 170 mL/hr at 01/08/17 1114    . [START ON 01/09/2017] cefTRIAXone (ROCEPHIN) 2 g in dextrose 5 % 50 mL IVPB   2 g Intravenous Q24H Bobette Mo, MD      . enoxaparin (LOVENOX) injection 40 mg  40 mg Subcutaneous Q24H Bobette Mo, MD   40 mg at 01/08/17 0902  . MEDLINE mouth rinse  15 mL Mouth Rinse BID Bobette Mo, MD   15 mL at 01/08/17 1112  . pantoprazole (PROTONIX) injection 40 mg  40 mg Intravenous Q24H Bobette Mo, MD   40 mg at 01/08/17 0403    Allergies as of 01/07/2017 - Review Complete 01/07/2017  Allergen Reaction Noted  . Lyrica [pregabalin] Swelling 06/20/2016    Past Medical History:  Diagnosis Date  . ALS (amyotrophic lateral sclerosis) (HCC)   . Anxiety    situational with injury  . Carpal tunnel syndrome    right hand  . GERD (gastroesophageal reflux disease)   . Hypertension   . PONV (postoperative nausea and vomiting)     Past Surgical History:  Procedure Laterality Date  . ANTERIOR CERVICAL DECOMP/DISCECTOMY FUSION N/A 03/09/2016   Procedure: Anterior Cervical Discectomy Fusion Cervial 5-7;  Surgeon: Venita Lick, MD;  [facility];  Service: Orthopedics;  Laterality: N/A;  Requests for 3.5 hrs  . BACK SURGERY  1995  . COLONOSCOPY    . HAND SURGERY Left   . HERNIA REPAIR Right    inguinal  . KNEE ARTHROSCOPY Right   . NECK SURGERY  03/09/2016   C5-C7    Family History  Problem Relation Age of Onset  . Pneumonia Mother   . Hypertension Mother   . Diabetes Father   . Cancer Father   . Diabetes Brother   . Healthy Daughter   . Healthy Son     Social History   Socioeconomic History  . Marital status: Married    Spouse name: Not on file  . Number of children: Not on file  . Years of education: Not on file  . Highest education level: Not on file  Social Needs  . Financial resource strain: Not on file  . Food insecurity - worry: Not on file  . Food insecurity - inability: Not on file  . Transportation needs - medical: Not on file  . Transportation needs - non-medical: Not on file  Occupational History  . Occupation:  disabled  Tobacco Use  . Smoking status: Former Smoker    Packs/day: 1.00    Years: 8.00    Pack years: 8.00    Types: Cigarettes    Last attempt to quit: 01/17/2007    Years since quitting: 9.9  . Smokeless tobacco: Never Used  . Tobacco comment: 1/2 - 1 PPD  Substance and Sexual Activity  . Alcohol use: No    Frequency: Never  . Drug use: No  . Sexual activity: Not on file  Other Topics Concern  . Not on file  Social History Narrative   Lives in [place], [place] with  his wife, 4 cats and 3 dogs.    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   Highest level of education:  High school     ROS:  General: See HPI.  Recent fever, generalized weakness due to ALS.  40 pound weight loss.   Eyes: Negative for vision changes.  ENT: Swallowing difficulties CV: Negative for chest pain, angina, palpitations, dyspnea on exertion, peripheral edema.  Respiratory: Negative for dyspnea at rest, dyspnea on exertion, cough, sputum, wheezing.  GI: See history of present illness. GU:  Negative for dysuria, hematuria, urinary incontinence, urinary frequency, nocturnal urination.  MS: Negative for joint pain, low back pain.  Derm: Negative for rash or itching.  Neuro: Negative for weakness, abnormal sensation, seizure, frequent headaches, memory loss, confusion.  Psych: Negative for anxiety, depression, suicidal ideation, hallucinations.  Endo: See HPI Heme: Negative for bruising or bleeding. Allergy: Negative for rash or hives.       Physical Examination: Vital signs in last 24 hours: Temp:  [98.2 F (36.8 C)-102 F (38.9 C)] 99.4 F (37.4 C) (12/24 1121) Pulse Rate:  [116-130] 118 (12/24 1200) Resp:  [23-40] 23 (12/24 1200) BP: (103-157)/(66-100) 122/81 (12/24 1100) SpO2:  [93 %-100 %] 96 % (12/24 1200) Weight:  [206 lb 5.6 oz (93.6 kg)] 206 lb 5.6 oz (93.6 kg) (12/24 0351) Last BM Date: 01/07/17  General: Well-nourished, well-developed.  Consumed thickened liquid and sherbet prior  to me entering the room.  Patient with weak cough reflex when suctioned multiple times while present.  As of minutes of this prior to patient no longer coughing.  O2 sats remained normal.  Communication via eye gaze tobi Head: Normocephalic, atraumatic.   Eyes: Conjunctiva pink, no icterus. Mouth: Oropharyngeal mucosa moist and pink , no lesions erythema or exudate. Neck: Supple without thyromegaly, masses, or lymphadenopathy.  Lungs: Clear to auscultation bilaterally.  Heart: Regular rate and rhythm, no murmurs rubs or gallops.  Abdomen: Bowel sounds are normal, nontender, nondistended, no hepatosplenomegaly or masses, no abdominal bruits or    hernia , no rebound or guarding.   Rectal: Not performed Extremities: Trace lower extremity edema, clubbing, deformity.  Neuro: Alert and oriented x 4 , grossly normal neurologically.  Skin: Warm and dry, no rash or jaundice.   Psych: Alert and cooperative, normal mood and affect.        Intake/Output from previous day: 12/23 0701 - 12/24 0700 In: 3550 [IV Piggyback:3550] Out: -  Intake/Output this shift: No intake/output data recorded.  Lab Results: CBC Recent Labs    01/08/17 0020 01/08/17 0646  WBC 23.8* 20.8*  HGB 14.7 13.9  HCT 41.7 40.1  MCV 85.6 85.7  PLT 221 185   BMET Recent Labs    01/08/17 0020 01/08/17 0646  NA 141 138  K 4.4 3.6  CL 105 104  CO2 25 23  GLUCOSE 125* 113*  BUN 9 7  CREATININE 0.48* 0.48*  CALCIUM 9.0 8.4*   LFT Recent Labs    01/08/17 0020  BILITOT 1.0  ALKPHOS 77  AST 17  ALT 22  PROT 7.4  ALBUMIN 3.8    Lipase No results for input(s): LIPASE in the last 72 hours.  PT/INR No results for input(s): LABPROT, INR in the last 72 hours.    Imaging Studies: No results found.Pierre.Alas[4 week]   Impression: 56 year old unfortunate gentleman with ALS with fairly rapid progression of the past 6 months.  40 pound weight loss.  Patient has been seen by speech this  admission.  Patient at risk of  aspiration and unable to adequately protect his airway. They recommend alternative means for nutrition for long-term use.  Consideration of comfort feeds of D1/pure and thin versus nectar thickened liquids fed via spouse only.  PEG placement would be beneficial in the setting of ALS for optimal nutrition, decrease risk of food aspiration, avenue from medication delivery.  Discussed at length with patient and spouse, PEG placement will not eliminate risk of aspiration even from saliva or through the means of regurgitation.  Peg placement will not prolong life or change outcome.  They both voiced understanding and patient through use of vobi acknowledged that he would like to pursue PEG placement this admission.    Plan: PEG tentatively planned for Wed, Dec 26th.  Last dose of lovenox on 12/25.   We would like to thank you for the opportunity to participate in the care of Steve Koch.  Leanna Battles. Dixon Boos [facility] [phone] 12/24/20183:06 PM     LOS: 0 days    addendum: at wife's request, I tried to call Rosey Bath at Dr. Alphonzo Dublin' office to update status and plan for PEG. I left information on Teresa's voicemail.   Leanna Battles. Dixon Boos [facility] [phone] 12/24/20183:15 PM

## 2017-01-08 NOTE — Progress Notes (Signed)
*  PRELIMINARY RESULTS* Echocardiogram 2D Echocardiogram has been performed.  Stacey DrainWhite, Malory Spurr J 01/08/2017, 10:31 AM

## 2017-01-08 NOTE — ED Provider Notes (Signed)
[name] Provider Note   CSN: [id number] Arrival date & time: 01/07/17  2309  Time seen 23:30 PM   History   Chief Complaint Chief Complaint  Patient presents with  . Hematuria   Level 5 caveat for patient being nonverbal  HPI Steve Koch is a 56 y.o. male.  HPI patient has ALS and is unable to use his communication device.  Wife states after lunch today he started having blood in his urine and she noted he was having frequency and having smaller amounts of urine.  She states sometimes she saw some blood clots in his urine.  She also noted he started having a fever later in the day and his temperature was 101.5.  She states he complained of being cold last night.  Also last night he had nausea and vomited once. He indicates he is not having abdominal pain or nausea.  Wife states he is never had this before.  He was last admitted to the hospital in June.  [facility]  Past Medical History:  Diagnosis Date  . ALS (amyotrophic lateral sclerosis) (HCC)   . Anxiety    situational with injury  . Carpal tunnel syndrome    right hand  . GERD (gastroesophageal reflux disease)   . Hypertension   . PONV (postoperative nausea and vomiting)     Patient Active Problem List   Diagnosis Date Noted  . Sepsis secondary to UTI (HCC) 01/08/2017  . GERD (gastroesophageal reflux disease) 01/08/2017  . ALS (amyotrophic lateral sclerosis) (HCC) 01/08/2017  . Sinus tachycardia 01/08/2017  . Abnormality of gait and mobility   . Dysarthria   . Benign essential HTN   . Spastic quadriparesis (HCC)   . Vitamin B 12 deficiency   . Generalized anxiety disorder   . Hypoalbuminemia due to protein-calorie malnutrition (HCC)   . Atrophy of muscle of both hands   . Fall 06/21/2016  . Cervical myelopathy (HCC) 06/21/2016  . Essential hypertension 06/21/2016  . Sensory disturbance 06/21/2016  . Gait instability  06/21/2016  . Hyperreflexia   . Poor tolerance for ambulation   . Myelopathy (HCC) 03/09/2016    Past Surgical History:  Procedure Laterality Date  . ANTERIOR CERVICAL DECOMP/DISCECTOMY FUSION N/A 03/09/2016   Procedure: Anterior Cervical Discectomy Fusion Cervial 5-7;  Surgeon: Venita Lickahari Brooks, MD;  [facility];  Service: Orthopedics;  Laterality: N/A;  Requests for 3.5 hrs  . BACK SURGERY  1995  . COLONOSCOPY    . HAND SURGERY Left   . HERNIA REPAIR Right    inguinal  . KNEE ARTHROSCOPY Right   . NECK SURGERY  03/09/2016   C5-C7       Home Medications    Prior to Admission medications   Medication Sig Start Date End Date Taking? Authorizing Provider  omeprazole (PRILOSEC) 40 MG capsule Take 40 mg by mouth daily.   Yes [provider]  PARoxetine (PAXIL) 20 MG tablet Take 20 mg by mouth daily.   Yes [provider]  zolpidem (AMBIEN) 10 MG tablet Take 10 mg by mouth at bedtime as needed for sleep.   Yes [provider]  amLODipine (NORVASC) 2.5 MG tablet Take 1 tablet (2.5 mg total) by mouth daily. 07/14/16   Love, Evlyn KannerPamela S, PA-C  aspirin EC 81 MG tablet Take 81 mg by mouth daily.    [provider]  baclofen (LIORESAL) 10 MG tablet Take 1 tablet (10 mg total)  by mouth 4 (four) times daily. Patient taking differently: Take 10 mg by mouth 2 (two) times daily.  07/14/16   Love, Evlyn KannerPamela S, PA-C  fluticasone (FLONASE) 50 MCG/ACT nasal spray Place 1 spray into both nostrils daily. 07/15/16   Love, Evlyn KannerPamela S, PA-C  pantoprazole (PROTONIX) 40 MG tablet Take 1 tablet (40 mg total) by mouth daily. 07/15/16   Love, Evlyn KannerPamela S, PA-C  traZODone (DESYREL) 50 MG tablet Take 1 tablet (50 mg total) by mouth at bedtime as needed for sleep. 07/14/16   Love, Evlyn KannerPamela S, PA-C  vitamin B-12 500 MCG tablet Take 1 tablet (500 mcg total) by mouth daily. 07/15/16   Jacquelynn CreeLove, Pamela S, PA-C    Family History Family History  Problem Relation Age of Onset  . Pneumonia Mother   .  Hypertension Mother   . Diabetes Father   . Cancer Father   . Diabetes Brother   . Healthy Daughter   . Healthy Son     Social History Social History   Tobacco Use  . Smoking status: Former Smoker    Packs/day: 1.00    Years: 8.00    Pack years: 8.00    Types: Cigarettes    Last attempt to quit: 01/17/2007    Years since quitting: 9.9  . Smokeless tobacco: Never Used  . Tobacco comment: 1/2 - 1 PPD  Substance Use Topics  . Alcohol use: No    Frequency: Never  . Drug use: No  lives at home Lives with spouse   Allergies   Lyrica [pregabalin]   Review of Systems Review of Systems  Unable to perform ROS: Patient nonverbal     Physical Exam Updated Vital Signs BP (!) 151/98   Pulse (!) 119   Temp (!) 101.7 F (38.7 C) (Rectal)   Resp (!) 31   SpO2 96%   Vital signs normal except tachycardia   Physical Exam  Constitutional: He appears well-developed and well-nourished.  Non-toxic appearance. He does not appear ill. No distress.  HENT:  Head: Normocephalic and atraumatic.  Right Ear: External ear normal.  Left Ear: External ear normal.  Nose: Nose normal. No mucosal edema or rhinorrhea.  Mouth/Throat: Mucous membranes are normal. No dental abscesses or uvula swelling.  Tongue dry, has gray coating of posterior tongue  Eyes: Conjunctivae and EOM are normal. Pupils are equal, round, and reactive to light.  Neck: Normal range of motion and full passive range of motion without pain. Neck supple.  Cardiovascular: Regular rhythm and normal heart sounds. Tachycardia present. Exam reveals no gallop and no friction rub.  No murmur heard. Pulmonary/Chest: Effort normal and breath sounds normal. No respiratory distress. He has no wheezes. He has no rhonchi. He has no rales. He exhibits no tenderness and no crepitus.  Abdominal: Soft. Normal appearance and bowel sounds are normal. He exhibits no distension. There is no tenderness. There is no rebound and no guarding.    Musculoskeletal: Normal range of motion. He exhibits no edema or tenderness.  Moves all extremities well.   Neurological: He is alert. He has normal strength.  Skin: Skin is warm, dry and intact. No rash noted. No erythema. No pallor.  Psychiatric: His speech is normal. His mood appears not anxious.  Unable to assess  Nursing note and vitals reviewed.    ED Treatments / Results  Labs (all labs ordered are listed, but only abnormal results are displayed) Results for orders placed or performed during the hospital encounter of 01/07/17  Blood  Culture (routine x 2)  Result Value Ref Range   Specimen Description RIGHT ANTECUBITAL    Special Requests      BOTTLES DRAWN AEROBIC AND ANAEROBIC Blood Culture adequate volume   Culture NO GROWTH < 12 HOURS    Report Status PENDING   Blood Culture (routine x 2)  Result Value Ref Range   Specimen Description BLOOD RIGHT HAND    Special Requests      BOTTLES DRAWN AEROBIC ONLY Blood Culture results may not be optimal due to an inadequate volume of blood received in culture bottles   Culture NO GROWTH < 12 HOURS    Report Status PENDING   Comprehensive metabolic panel  Result Value Ref Range   Sodium 141 135 - 145 mmol/L   Potassium 4.4 3.5 - 5.1 mmol/L   Chloride 105 101 - 111 mmol/L   CO2 25 22 - 32 mmol/L   Glucose, Bld 125 (H) 65 - 99 mg/dL   BUN 9 6 - 20 mg/dL   Creatinine, Ser 1.61 (L) 0.61 - 1.24 mg/dL   Calcium 9.0 8.9 - 09.6 mg/dL   Total Protein 7.4 6.5 - 8.1 g/dL   Albumin 3.8 3.5 - 5.0 g/dL   AST 17 15 - 41 U/L   ALT 22 17 - 63 U/L   Alkaline Phosphatase 77 38 - 126 U/L   Total Bilirubin 1.0 0.3 - 1.2 mg/dL   GFR calc non Af Amer >60 >60 mL/min   GFR calc Af Amer >60 >60 mL/min   Anion gap 11 5 - 15  CBC WITH DIFFERENTIAL  Result Value Ref Range   WBC 23.8 (H) 4.0 - 10.5 K/uL   RBC 4.87 4.22 - 5.81 MIL/uL   Hemoglobin 14.7 13.0 - 17.0 g/dL   HCT 04.5 40.9 - 81.1 %   MCV 85.6 78.0 - 100.0 fL   MCH 30.2 26.0 - 34.0  pg   MCHC 35.3 30.0 - 36.0 g/dL   RDW 91.4 78.2 - 95.6 %   Platelets 221 150 - 400 K/uL   Neutrophils Relative % 88 %   Neutro Abs 20.9 (H) 1.7 - 7.7 K/uL   Lymphocytes Relative 7 %   Lymphs Abs 1.6 0.7 - 4.0 K/uL   Monocytes Relative 5 %   Monocytes Absolute 1.3 (H) 0.1 - 1.0 K/uL   Eosinophils Relative 0 %   Eosinophils Absolute 0.0 0.0 - 0.7 K/uL   Basophils Relative 0 %   Basophils Absolute 0.0 0.0 - 0.1 K/uL  Urinalysis, Routine w reflex microscopic  Result Value Ref Range   Color, Urine YELLOW YELLOW   APPearance HAZY (A) CLEAR   Specific Gravity, Urine 1.024 1.005 - 1.030   pH 6.0 5.0 - 8.0   Glucose, UA NEGATIVE NEGATIVE mg/dL   Hgb urine dipstick LARGE (A) NEGATIVE   Bilirubin Urine NEGATIVE NEGATIVE   Ketones, ur 80 (A) NEGATIVE mg/dL   Protein, ur 213 (A) NEGATIVE mg/dL   Nitrite POSITIVE (A) NEGATIVE   Leukocytes, UA LARGE (A) NEGATIVE   RBC / HPF 6-30 0 - 5 RBC/hpf   WBC, UA TOO NUMEROUS TO COUNT 0 - 5 WBC/hpf   Bacteria, UA RARE (A) NONE SEEN   Squamous Epithelial / LPF 0-5 (A) NONE SEEN   Mucus PRESENT   Lactic acid, plasma  Result Value Ref Range   Lactic Acid, Venous 1.1 0.5 - 1.9 mmol/L  Magnesium  Result Value Ref Range   Magnesium 1.9 1.7 - 2.4 mg/dL  Phosphorus  Result Value Ref Range   Phosphorus 2.6 2.5 - 4.6 mg/dL  CBC WITH DIFFERENTIAL  Result Value Ref Range   WBC 20.8 (H) 4.0 - 10.5 K/uL   RBC 4.68 4.22 - 5.81 MIL/uL   Hemoglobin 13.9 13.0 - 17.0 g/dL   HCT 40.9 81.1 - 91.4 %   MCV 85.7 78.0 - 100.0 fL   MCH 29.7 26.0 - 34.0 pg   MCHC 34.7 30.0 - 36.0 g/dL   RDW 78.2 95.6 - 21.3 %   Platelets 185 150 - 400 K/uL   Neutrophils Relative % 89 %   Neutro Abs 18.7 (H) 1.7 - 7.7 K/uL   Lymphocytes Relative 6 %   Lymphs Abs 1.2 0.7 - 4.0 K/uL   Monocytes Relative 5 %   Monocytes Absolute 0.9 0.1 - 1.0 K/uL   Eosinophils Relative 0 %   Eosinophils Absolute 0.0 0.0 - 0.7 K/uL   Basophils Relative 0 %   Basophils Absolute 0.0 0.0 - 0.1 K/uL   Basic metabolic panel  Result Value Ref Range   Sodium 138 135 - 145 mmol/L   Potassium 3.6 3.5 - 5.1 mmol/L   Chloride 104 101 - 111 mmol/L   CO2 23 22 - 32 mmol/L   Glucose, Bld 113 (H) 65 - 99 mg/dL   BUN 7 6 - 20 mg/dL   Creatinine, Ser 0.86 (L) 0.61 - 1.24 mg/dL   Calcium 8.4 (L) 8.9 - 10.3 mg/dL   GFR calc non Af Amer >60 >60 mL/min   GFR calc Af Amer >60 >60 mL/min   Anion gap 11 5 - 15  Magnesium  Result Value Ref Range   Magnesium 1.7 1.7 - 2.4 mg/dL  Phosphorus  Result Value Ref Range   Phosphorus 2.5 2.5 - 4.6 mg/dL   Laboratory interpretation all normal except leukocytosis, UTI    EKG  EKG Interpretation  Date/Time:  Sunday January 07 2017 23:20:44 EST Ventricular Rate:  127 PR Interval:    QRS Duration: 98 QT Interval:  273 QTC Calculation: 397 R Axis:   65 Text Interpretation:  Sinus tachycardia Inferior infarct, age indeterminate Lateral leads are also involved Since last tracing rate faster 09 Mar 2016 Non-specific ST-t changes secondary to heart rate Confirmed by Devoria Albe ([id number]) on 01/07/2017 11:35:25 PM       Radiology No results found.  Procedures .Critical Care Performed by: Devoria Albe, MD Authorized by: Devoria Albe, MD   Critical care provider statement:    Critical care time (minutes):  31   Critical care was necessary to treat or prevent imminent or life-threatening deterioration of the following conditions:  Circulatory failure   Critical care was time spent personally by me on the following activities:  Discussions with consultants, examination of patient, obtaining history from patient or surrogate, ordering and review of laboratory studies, ordering and performing treatments and interventions and re-evaluation of patient's condition   (including critical care time)  Medications Ordered in ED Medications  sodium chloride 0.9 % bolus 1,000 mL (0 mLs Intravenous Stopped 01/08/17 0042)    And  sodium chloride 0.9 % bolus 1,000 mL  (0 mLs Intravenous Stopped 01/08/17 0133)    And  sodium chloride 0.9 % bolus 1,000 mL (0 mLs Intravenous Stopped 01/08/17 0042)    And  sodium chloride 0.9 % bolus 500 mL (0 mLs Intravenous Stopped 01/08/17 0133)  cefTRIAXone (ROCEPHIN) 2 g in dextrose 5 % 50 mL IVPB (0 g Intravenous Stopped 01/08/17 0128)  acetaminophen (TYLENOL)  suppository 650 mg (650 mg Rectal Given 01/08/17 0139)     Initial Impression / Assessment and Plan / ED Course  I have reviewed the triage vital signs and the nursing notes.  Pertinent labs & imaging results that were available during my care of the patient were reviewed by me and considered in my medical decision making (see chart for details).     Patient was noted to have tachycardia and tachypnea with fever, he was started on sepsis protocol without calling code sepsis, his blood pressure is normal.  The most likely etiology of his fever sounds like it will be a urinary tract infection.  He has not been admitted to the hospital in the last 90 days, he was started on community-acquired urinary tract infection antibiotics, Rocephin.  Also wife states he has not had a UTI in the past.   Bladder scan was 72 cc after voiding  Rectal temp shows he has a fever, he was just given rectal suppository, Tylenol around 2 AM.  He still has some tachycardia after his IV fluids.  Hopefully this will improve after treatment of his fever.  02:13 AM Dr Robb Matar, hospitalist, will admit  Final Clinical Impressions(s) / ED Diagnoses   Final diagnoses:  Gross hematuria  Urinary tract infection with hematuria, site unspecified  Sepsis, due to unspecified organism [facility])    Plan admission  Devoria Albe, MD, Concha Pyo, MD 01/08/17 [phone]

## 2017-01-08 NOTE — Progress Notes (Signed)
1117 Patient and patient's wife requesting feeding tube placement. MD notified.

## 2017-01-08 NOTE — Progress Notes (Signed)
1034 Patient continues to run ST and heart rate now sustaining in 130's. No PRN medication noted at this time. MD notified.

## 2017-01-08 NOTE — Care Management Note (Signed)
Case Management Note  Patient Details  Name: Steve Koch MRN: [id number] Date of Birth: 1960-05-05  Subjective/Objective:     CM consulted for Home health. Adm with sepsis secondary to UTI. From home with wife, wife is caregiver. Patient with recent diagnosis of ALS. Wife reports she takes patient with her to work. She is the only caregiver. She has already contacted [facility] for care. Patient is planned for a peg tube, RN asking to see if appropriate to consult GI will here in hospital.               Action/Plan: Hospice has already been contacted and will be admitting patient. CM following.   Expected Discharge Date:      unk            Expected Discharge Plan:  Home w Hospice Care  In-House Referral:     Discharge planning Services  CM Consult  Post Acute Care Choice:    Choice offered to:  Patient, Spouse  DME Arranged:    DME Agency:     HH Arranged:    HH Agency:     Status of Service:  In process, will continue to follow  If discussed at Long Length of Stay Meetings, dates discussed:    Additional Comments:  Steve Koch, Steve OilerSharley Diane, RN 01/08/2017, 11:27 AM

## 2017-01-08 NOTE — Progress Notes (Signed)
**Note De-identified Matylda Fehring Obfuscation** EKG complete RN notified of results and placed in patient chart 

## 2017-01-08 NOTE — Evaluation (Signed)
Clinical/Bedside Swallow Evaluation Patient Details  Name: Steve ShadCharles Kuchera MRN: [id number] Date of Birth: 01-30-1960  Today's Date: 01/08/2017 Time: SLP Start Time (ACUTE ONLY): 1300 SLP Stop Time (ACUTE ONLY): 1400 SLP Time Calculation (min) (ACUTE ONLY): 60 min  Past Medical History:  Past Medical History:  Diagnosis Date  . ALS (amyotrophic lateral sclerosis) (HCC)   . Anxiety    situational with injury  . Carpal tunnel syndrome    right hand  . GERD (gastroesophageal reflux disease)   . Hypertension   . PONV (postoperative nausea and vomiting)    Past Surgical History:  Past Surgical History:  Procedure Laterality Date  . ANTERIOR CERVICAL DECOMP/DISCECTOMY FUSION N/A 03/09/2016   Procedure: Anterior Cervical Discectomy Fusion Cervial 5-7;  Surgeon: Venita Lickahari Brooks, MD;  [facility];  Service: Orthopedics;  Laterality: N/A;  Requests for 3.5 hrs  . BACK SURGERY  1995  . COLONOSCOPY    . HAND SURGERY Left   . HERNIA REPAIR Right    inguinal  . KNEE ARTHROSCOPY Right   . NECK SURGERY  03/09/2016   C5-C7   HPI:  Steve Koch a 56 y.o.malewith ALS, anxiety, GERD who was brought to the emergency department by family members due to fever, hematuria, nausea with vomiting, urinary symptoms. Patient became ill a couple of days ago but resisted coming to the ED. Finally after multiple request by family members he agreed. Pt was diagnosed with ALS in July of 2018 and is followed by Dr. [name] at the ALS clinic. He was last seen by SLP [name] in November and an objective swallow assessment was recommended, but never completed. BSE ordered due to dysphagia and weight loss in setting of ALS.    Assessment / Plan / Recommendation Clinical Impression  Pt presents with moderate/severe oral phase dysphagia with suspected pharyngeal phase dysphagia characterized by weak oral sling (CN VII and XII involvement) with decreased labial seal and lingual movement resulting  in decreased labial closure and decreased secretion managment, prolonged oral transit, and suspected delay in swallow initiation with delayed throat clearing and coughing noted. SLP discussed option of completing objective assessment (MBSS) here, however after discussing with Pt/spouse and administering po trials, I do not feel that MBSS would be most appropriate at this time. Pt would benefit most from FEES, however we do not have this available here and wife reports that [facility]. MBSS can be attempted, however it is only a moment in time and Pt unlikely to be able to take enough for an accurate assessment at this time. He is mostly taking small syringe sips of thin liquid and some from occluded straw into buccal pocket by wife. SLP discussed trial of nectar-thickened liquids due to severity of oral phase deficits and Pt/spouse willing. Pt presents with positive signs/symptoms of decreased airway protection with all po intake (and even without po and uses oral suction at home). Pt and spouse stated that they would like to pursue PEG placement and GI has been consulted. SLP in full agreement with RD recommendations (PEG tube is indicated for ALS pt who have symptomatic dysphagia, prolonged eating time, negative calorie balance and unintentional weight loss > 5-10%. Presumed benefits include optimal nutrition and fluid intake/delivery, decreased risk of food aspiration and avenue for medication delivery). Recommend alternative means of nutrition supplementation and consideration of comfort feeds of D1/puree and thin vs NTL and only feeding by Pt's spouse or SLP at this time. SLP will defer ordering comfort  feeds at this time pending MD involvement. GI here to see Pt at this time as well. There will be no SLP coverage tomorrow, but will follow on Wednesday. SLP Visit Diagnosis: Dysphagia, oropharyngeal phase (R13.12)    Aspiration Risk  Severe aspiration risk;Risk for inadequate  nutrition/hydration    Diet Recommendation Alternative means - long-term(comfort feeds of D1/puree and NTL)   Liquid Administration via: Other (Comment)(tsp vs. .5cc presentations NTL) Medication Administration: Via alternative means Supervision: (Spouse to feed) Compensations: Slow rate;Small sips/bites(feed very slowly) Postural Changes: Seated upright at 90 degrees;Remain upright for at least 30 minutes after po intake    Other  Recommendations Recommended Consults: Consider GI evaluation Oral Care Recommendations: Staff/trained caregiver to provide oral care;Oral care QID Other Recommendations: Clarify dietary restrictions;Have oral suction available   Follow up Recommendations 24 hour supervision/assistance      Frequency and Duration min 2x/week  1 week       Prognosis Prognosis for Safe Diet Advancement: Guarded Barriers to Reach Goals: Severity of deficits      Swallow Study   General Date of Onset: 01/07/17 HPI: Steve Koch a 56 y.o.malewith ALS, anxiety, GERD who was brought to the emergency department by family members due to fever, hematuria, nausea with vomiting, urinary symptoms. Patient became ill a couple of days ago but resisted coming to the ED. Finally after multiple request by family members he agreed. Pt was diagnosed with ALS in July of 2018 and is followed by Dr. [name] at the ALS clinic. He was last seen by SLP [name] in November and an objective swallow assessment was recommended, but never completed. BSE ordered due to dysphagia and weight loss in setting of ALS.  Type of Study: Bedside Swallow Evaluation Previous Swallow Assessment: [facility] puree/thin with rec for objective Diet Prior to this Study: NPO Temperature Spikes Noted: Yes Respiratory Status: Room air History of Recent Intubation: No Behavior/Cognition: Alert;Cooperative;Pleasant mood(uses Dynavox for eye gaze communication) Oral Cavity Assessment:  Within Functional Limits(mild coating on tongue) Oral Care Completed by SLP: Yes Oral Cavity - Dentition: Adequate natural dentition Vision: Impaired for self-feeding Self-Feeding Abilities: Total assist Patient Positioning: Upright in bed Baseline Vocal Quality: (/a/ only) Volitional Cough: Weak Volitional Swallow: Able to elicit    Oral/Motor/Sensory Function Overall Oral Motor/Sensory Function: Moderate impairment Facial ROM: Reduced right;Reduced left;Suspected CN VII (facial) dysfunction Facial Symmetry: Within Functional Limits Facial Strength: Reduced right;Reduced left;Suspected CN VII (facial) dysfunction Lingual ROM: Suspected CN XII (hypoglossal) dysfunction(unable to protrude) Lingual Symmetry: Suspected CN XII (hypoglossal) dysfunction Lingual Strength: Reduced;Suspected CN XII (hypoglossal) dysfunction Velum: Within Functional Limits Mandible: Impaired   Ice Chips Ice chips: Not tested   Thin Liquid Thin Liquid: Impaired Presentation: Straw;Spoon Oral Phase Impairments: Reduced labial seal;Reduced lingual movement/coordination Pharyngeal  Phase Impairments: Suspected delayed Swallow;Multiple swallows;Cough - Immediate;Cough - Delayed    Nectar Thick Nectar Thick Liquid: Impaired Presentation: Spoon(syringe) Oral Phase Impairments: Reduced labial seal;Reduced lingual movement/coordination Pharyngeal Phase Impairments: Suspected delayed Swallow;Multiple swallows;Cough - Delayed   Honey Thick Honey Thick Liquid: Not tested   Puree Puree: Not tested   Solid   Thank you,  Havery MorosDabney Toshika Parrow, CCC-SLP [phone]    Solid: Not tested        Rayson Rando 01/08/2017,2:59 PM

## 2017-01-08 NOTE — H&P (Signed)
4       History and Physical    Archer Moist WGN:[id number] DOB: 1960-02-28 DOA: 01/07/2017  PCP: [facility]   Patient coming from: Home.  I have personally briefly reviewed patient's old medical records in [facility]  Chief Complaint: Fever, nausea, vomiting and blood in the urine.  HPI: Steve Koch is a 56 y.o. male with medical history significant of albuterol for lateral sclerosis, anxiety, right hand carpal tunnel syndrome, GERD, hypertension, postoperative nausea and vomiting who is being brought to the emergency department by family members due to fever today associated with hematuria, nausea and one episode of emesis the previous night, urinary frequency and decreased urinary output today.   Per patient's wife, on Saturday, 01/06/2017 in the evening around 1845 he felt warm and nauseous.  He subsequently had an episode of emesis and felt better after a few minutes.  He was constipated and his wife manually disimpacted him, which provided some relief.  On Sunday, 01/07/2017 after lunchtime, when he urinated his urine was red and had a real strong odor.  She suggested for him to come to the hospital but he did not want to come.  The patient developed increased urinary frequency as well.  Once again, his wife appealed to him to come to the emergency department, but he declined.  Around 1845 he became nauseous again, was able to use the bathroom, but then his wife noticed that he was febrile with a temperature above 100.80F.  She asked him to come to the emergency department and he refused that again.  At that time, his wife called his daughter, who will appeal to him and finally he was brought to the emergency department.   ED Course: Initial vital signs temperature 98.68F (36.8 C, pulse 130, respirations 30, blood pressure 136/87 mmHg and O2 sat 96% on room air.  Urinalysis shows large hemoglobinuria, ketonuria of 80 mg/dL, proteinuria of 784 mg/dL,  positive nitrates, large leukocyte esterase, microscopic exam shows 6-30 RBC, TNTC WBC per HPF, rare bacteria and 0-5 squamous epithelial cells per LPF.  His CBC shows a white count of 23.8 with 88% neutrophils, hemoglobin 14.7 g/dL and platelets 696.  His CMP shows a glucose of 125 and creatinine of 0.48 mg/dL.  All other CMP values are normal.  His lactic acid was 1.1 mmol/L.  Blood cultures x2 were drawn.  ER treatment: The patient received acetaminophen 650 mg rectally, 4000 mL of normal saline bolus and 2 g of ceftriaxone IVPB.  Review of Systems: Unable to obtain since the patient is unable to speak.   Past Medical History:  Diagnosis Date  . ALS (amyotrophic lateral sclerosis) (HCC)   . Anxiety    situational with injury  . Carpal tunnel syndrome    right hand  . GERD (gastroesophageal reflux disease)   . Hypertension   . PONV (postoperative nausea and vomiting)     Past Surgical History:  Procedure Laterality Date  . ANTERIOR CERVICAL DECOMP/DISCECTOMY FUSION N/A 03/09/2016   Procedure: Anterior Cervical Discectomy Fusion Cervial 5-7;  Surgeon: Venita Lick, MD;  Location: [facility] OR;  Service: Orthopedics;  Laterality: N/A;  Requests for 3.5 hrs  . BACK SURGERY  1995  . COLONOSCOPY    . HAND SURGERY Left   . HERNIA REPAIR Right    inguinal  . KNEE ARTHROSCOPY Right   . NECK SURGERY  03/09/2016   C5-C7     reports that he quit smoking about 9 years ago.  His smoking use included cigarettes. He has a 8.00 pack-year smoking history. he has never used smokeless tobacco. He reports that he drinks alcohol. He reports that he does not use drugs.  Allergies  Allergen Reactions  . Lyrica [Pregabalin] Swelling    Family History  Problem Relation Age of Onset  . Pneumonia Mother   . Hypertension Mother   . Diabetes Father   . Cancer Father   . Diabetes Brother   . Healthy Daughter   . Healthy Son     Prior to Admission medications   Medication Sig Start Date End Date  Taking? Authorizing Provider  omeprazole (PRILOSEC) 40 MG capsule Take 40 mg by mouth daily.   Yes [provider]  PARoxetine (PAXIL) 20 MG tablet Take 20 mg by mouth daily.   Yes [provider]  zolpidem (AMBIEN) 10 MG tablet Take 10 mg by mouth at bedtime as needed for sleep.   Yes [provider]  amLODipine (NORVASC) 2.5 MG tablet Take 1 tablet (2.5 mg total) by mouth daily. 07/14/16   Love, Evlyn KannerPamela S, PA-C  aspirin EC 81 MG tablet Take 81 mg by mouth daily.    [provider]  baclofen (LIORESAL) 10 MG tablet Take 1 tablet (10 mg total) by mouth 4 (four) times daily. Patient taking differently: Take 10 mg by mouth 2 (two) times daily.  07/14/16   Love, Evlyn KannerPamela S, PA-C  fluticasone (FLONASE) 50 MCG/ACT nasal spray Place 1 spray into both nostrils daily. 07/15/16   Love, Evlyn KannerPamela S, PA-C  pantoprazole (PROTONIX) 40 MG tablet Take 1 tablet (40 mg total) by mouth daily. 07/15/16   Love, Evlyn KannerPamela S, PA-C  traZODone (DESYREL) 50 MG tablet Take 1 tablet (50 mg total) by mouth at bedtime as needed for sleep. 07/14/16   Love, Evlyn KannerPamela S, PA-C  vitamin B-12 500 MCG tablet Take 1 tablet (500 mcg total) by mouth daily. 07/15/16   Jacquelynn CreeLove, Pamela S, PA-C    Physical Exam: Vitals:   01/08/17 0100 01/08/17 0107 01/08/17 0113 01/08/17 0200  BP: (!) 157/100 (!) 151/98  (!) 151/100  Pulse: (!) 120 (!) 119  (!) 123  Resp: (!) 30 (!) 31    Temp:   (!) 101.7 F (38.7 C)   TempSrc:   Rectal   SpO2: 97% 96%  98%    Constitutional: Febrile, nonverbal. Eyes: PERRL, lids and conjunctivae normal ENMT: Mucous membranes are moist. Posterior pharynx clear of any exudate or lesions. Neck: Normal, supple, no masses, no thyromegaly. Respiratory: Decreased breath sounds on bases, otherwise clear to auscultation bilaterally, no wheezing, no crackles. Normal respiratory effort. No accessory muscle use.  Cardiovascular: Tachycardic at 125 bpm, no murmurs / rubs / gallops.  Trace lower extremities  edema. 2+ pedal pulses. No carotid bruits.  Abdomen: Soft, no tenderness, no masses palpated. No hepatosplenomegaly. Bowel sounds positive.  Musculoskeletal: no clubbing / cyanosis. Good ROM, no contractures. Normal muscle tone.  Skin: no rashes, lesions, ulcers on limited skin exam. 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 Psychiatric: Nonverbal.  Unable to evaluate.   Labs on Admission: I have personally reviewed following labs and imaging studies  CBC: Recent Labs  Lab 01/08/17 0020  WBC 23.8*  NEUTROABS 20.9*  HGB 14.7  HCT 41.7  MCV 85.6  PLT 221   Basic Metabolic Panel: Recent Labs  Lab 01/08/17 0020  NA 141  K 4.4  CL 105  CO2 25  GLUCOSE 125*  BUN 9  CREATININE 0.48*  CALCIUM 9.0   GFR: CrCl cannot be calculated (Unknown ideal weight.). Liver Function Tests: Recent Labs  Lab 01/08/17 0020  AST 17  ALT 22  ALKPHOS 77  BILITOT 1.0  PROT 7.4  ALBUMIN 3.8   No results for input(s): LIPASE, AMYLASE in the last 168 hours. No results for input(s): AMMONIA in the last 168 hours. Coagulation Profile: No results for input(s): INR, PROTIME in the last 168 hours. Cardiac Enzymes: No results for input(s): CKTOTAL, CKMB, CKMBINDEX, TROPONINI in the last 168 hours. BNP (last 3 results) No results for input(s): PROBNP in the last 8760 hours. HbA1C: No results for input(s): HGBA1C in the last 72 hours. CBG: No results for input(s): GLUCAP in the last 168 hours. Lipid Profile: No results for input(s): CHOL, HDL, LDLCALC, TRIG, CHOLHDL, LDLDIRECT in the last 72 hours. Thyroid Function Tests: No results for input(s): TSH, T4TOTAL, FREET4, T3FREE, THYROIDAB in the last 72 hours. Anemia Panel: No results for input(s): VITAMINB12, FOLATE, FERRITIN, TIBC, IRON, RETICCTPCT in the last 72 hours. Urine analysis:    Component Value Date/Time   COLORURINE YELLOW 01/08/2017 0041   APPEARANCEUR HAZY (A) 01/08/2017 0041    LABSPEC 1.024 01/08/2017 0041   PHURINE 6.0 01/08/2017 0041   GLUCOSEU NEGATIVE 01/08/2017 0041   HGBUR LARGE (A) 01/08/2017 0041   BILIRUBINUR NEGATIVE 01/08/2017 0041   KETONESUR 80 (A) 01/08/2017 0041   PROTEINUR 100 (A) 01/08/2017 0041   NITRITE POSITIVE (A) 01/08/2017 0041   LEUKOCYTESUR LARGE (A) 01/08/2017 0041    Radiological Exams on Admission: No results found.  EKG: Independently reviewed.  Vent. rate 127 BPM PR interval * ms QRS duration 98 ms QT/QTc 273/397 ms P-R-T axes 63 65 -80 Sinus tachycardia Inferior infarct, age indeterminate Lateral leads are also involved Since last tracing rate faster 09 Mar 2016 Non-specific ST-t changes secondary to heart rate  Assessment/Plan Principal Problem:   Sepsis secondary to UTI (HCC) Admit to stepdown/inpatient. Keep n.p.o. Continue supplemental oxygen. Continue IV fluids. Monitor intake and output. Continue ceftriaxone 2 g IVPB every 24 hours. Follow-up blood cultures and sensitivity. Follow-up urine culture and sensitivity.  Active Problems:   Sinus tachycardia Secondary to sepsis and psychological stress. Continue IV fluids. Optimize electrolytes. Magnesium and potassium supplemented. Check echocardiogram.    ALS (amyotrophic lateral sclerosis) (HCC) Unable to swallow at this time. Keep NPO. Will use low-dose lorazepam instead of baclofen for spasticity.Marland Kitchen. His wife, Steve Koch, is the only caregiver at home. We will consult case management to evaluate home health needs.    Essential hypertension Monitor blood pressure. Start as needed antihypertensive if BP continues to trend up.    Vitamin B 12 deficiency Resume B12 oral supplementation when able to swallow.    Generalized anxiety disorder Resume paroxetine once able to safely swallow.    GERD (gastroesophageal reflux disease) Protonix 40 mg IVP every 24 hours.   DVT prophylaxis: Lovenox SQ. Code Status: DNR/DNI. Family Communication:  Information obtained from his wife Steve Koch Disposition Plan: Admit for IV antibiotic therapy for 2-3 days. Consults called:   Case management evaluation. Admission status: Inpatient/stepdown.   Bobette Moavid Manuel Dekendrick Uzelac MD [facility] Pager [phone].  If 7PM-7AM, please contact night-coverage [email or web address] Password [email or web address]  01/08/2017, 2:32 AM

## 2017-01-08 NOTE — Progress Notes (Signed)
Initial Nutrition Assessment  DOCUMENTATION CODES:   Non-severe (moderate) malnutrition in context of chronic illness  INTERVENTION:  ST referral pending- assessment of current swallow function  GI referral to consider if pt appropriate for PEG placement  RD will continue to follow    NUTRITION DIAGNOSIS:   Moderate Malnutrition related to dysphagia, chronic illness(ALS ) as evidenced by percent weight loss (17% in 5 months) and decreased ability to meet energy needs orally in a hypermetabolic state   GOAL:   Patient will meet greater than or equal to 90% of their needs  MONITOR:   Diet advancement, Skin, Weight trends, Labs, PO intake REASON FOR ASSESSMENT:   Consult, Malnutrition Screening Tool Assessment of nutrition requirement/status  ASSESSMENT:  The pt is a 56 yo male with hx of ALS (amyotrophic lateral sclerosis), GERD and HTN. His wife is at bedside. The patient also has a device here that he uses for communication.   His weight has dropped severely 17% (~44 lbs) in the past 5 months. His wife confirms with the patient that he would like to talk with MD regarding potential PEG tube placement.   He is having difficulty eating now and has been unable to consume sufficient calories orally to maintain his usual weight. At home he has been tolerating a pureed diet with thin liquids but wife says it takes him a very long time to eat and it wears him out. Although muscle loss cannot be prevented with ALS, muscle loss can increase if nutritionally compromised. Calorie needs can also change throughout the disease progression.   As it takes more effort to complete daily activities, the body requires more calories. A decreased appetite, feeling full sooner, difficulty chewing and swallowing and trouble self-feeding due to muscle weakness can all contribute to a calorie deficit that leads to weight loss.  Clinical guidelines: PEG tube is indicated for ALS pt who have symptomatic  dysphagia, prolonged eating time, negative calorie balance and unintentional weight loss > 5-10%. Presumed benefits include optimal nutrition and fluid intake/delivery, decreased risk of food aspiration and avenue for medication delivery.      Recent Labs  Lab 01/08/17 0020 01/08/17 0646  NA 141 138  K 4.4 3.6  CL 105 104  CO2 25 23  BUN 9 7  CREATININE 0.48* 0.48*  CALCIUM 9.0 8.4*  MG 1.9 1.7  PHOS 2.6 2.5  GLUCOSE 125* 113*    NUTRITION - FOCUSED PHYSICAL EXAM: Muscle atrophy of muscles under voluntary control  expected given pt progression of ALS and muscle loss will be accelerated in the setting of inadequate nutrition intake  Diet Order:  Diet NPO time specified  EDUCATION NEEDS:   No education needs have been identified at this time  Skin:  Skin Assessment: Reviewed RN Assessment  Last BM:  12/23   Height:   Ht Readings from Last 1 Encounters:  01/08/17 6\' 1"  (1.854 m)    Weight:   Wt Readings from Last 1 Encounters:  01/08/17 206 lb 5.6 oz (93.6 kg)    Ideal Body Weight:  84 kg  BMI:  Body mass index is 27.22 kg/m.  Estimated Nutritional Needs:   Kcal:  [id number]  Protein:  113-131   Fluid:  2.3-2.5 liters daily   Royann ShiversLynn Landrie Beale MS,RD,CSG,LDN Office: #[phone] Pager: [phone]

## 2017-01-09 DIAGNOSIS — N390 Urinary tract infection, site not specified: Secondary | ICD-10-CM

## 2017-01-09 DIAGNOSIS — A419 Sepsis, unspecified organism: Secondary | ICD-10-CM

## 2017-01-09 DIAGNOSIS — I10 Essential (primary) hypertension: Secondary | ICD-10-CM

## 2017-01-09 LAB — CBC WITH DIFFERENTIAL/PLATELET
BASOS ABS: 0 10*3/uL (ref 0.0–0.1)
BASOS PCT: 0 %
Eosinophils Absolute: 0.1 10*3/uL (ref 0.0–0.7)
Eosinophils Relative: 0 %
HEMATOCRIT: 39 % (ref 39.0–52.0)
HEMOGLOBIN: 13.6 g/dL (ref 13.0–17.0)
LYMPHS PCT: 6 %
Lymphs Abs: 1.6 10*3/uL (ref 0.7–4.0)
MCH: 29.7 pg (ref 26.0–34.0)
MCHC: 34.9 g/dL (ref 30.0–36.0)
MCV: 85.2 fL (ref 78.0–100.0)
Monocytes Absolute: 1.7 10*3/uL — ABNORMAL HIGH (ref 0.1–1.0)
Monocytes Relative: 7 %
NEUTROS ABS: 21.8 10*3/uL — AB (ref 1.7–7.7)
NEUTROS PCT: 87 %
Platelets: 179 10*3/uL (ref 150–400)
RBC: 4.58 MIL/uL (ref 4.22–5.81)
RDW: 14.5 % (ref 11.5–15.5)
WBC: 25.2 10*3/uL — ABNORMAL HIGH (ref 4.0–10.5)

## 2017-01-09 LAB — BASIC METABOLIC PANEL
ANION GAP: 12 (ref 5–15)
BUN: 7 mg/dL (ref 6–20)
CALCIUM: 8.3 mg/dL — AB (ref 8.9–10.3)
CHLORIDE: 104 mmol/L (ref 101–111)
CO2: 19 mmol/L — AB (ref 22–32)
Creatinine, Ser: 0.49 mg/dL — ABNORMAL LOW (ref 0.61–1.24)
GFR calc non Af Amer: >60 mL/min (ref >60)
GLUCOSE: 89 mg/dL (ref 65–99)
POTASSIUM: 3.7 mmol/L (ref 3.5–5.1)
Sodium: 135 mmol/L (ref 135–145)

## 2017-01-09 LAB — MRSA PCR SCREENING: MRSA by PCR: NEGATIVE

## 2017-01-09 MED ORDER — SALINE SPRAY 0.65 % NA SOLN
1.0000 | NASAL | Status: DC | PRN
  Filled 2017-01-09: qty 44

## 2017-01-09 MED ORDER — KETOROLAC TROMETHAMINE 30 MG/ML IJ SOLN
30.0000 mg | Freq: Four times a day (QID) | INTRAMUSCULAR | Status: AC | PRN
  Administered 2017-01-09 – 2017-01-11 (×4): 30 mg via INTRAVENOUS
  Filled 2017-01-09 (×4): qty 1

## 2017-01-09 MED ORDER — METOPROLOL TARTRATE 5 MG/5ML IV SOLN
5.0000 mg | Freq: Once | INTRAVENOUS | Status: AC
  Administered 2017-01-09: 5 mg via INTRAVENOUS

## 2017-01-09 MED ORDER — METOPROLOL TARTRATE 5 MG/5ML IV SOLN
INTRAVENOUS | Status: AC
  Filled 2017-01-09: qty 5

## 2017-01-09 MED ORDER — LORAZEPAM 2 MG/ML IJ SOLN
0.5000 mg | INTRAMUSCULAR | Status: DC | PRN
  Administered 2017-01-09 – 2017-01-11 (×5): 0.5 mg via INTRAVENOUS
  Filled 2017-01-09 (×5): qty 1

## 2017-01-09 MED ORDER — METOPROLOL TARTRATE 5 MG/5ML IV SOLN
5.0000 mg | INTRAVENOUS | Status: DC | PRN
  Administered 2017-01-11: 5 mg via INTRAVENOUS
  Filled 2017-01-09: qty 5

## 2017-01-09 MED ORDER — METOPROLOL TARTRATE 5 MG/5ML IV SOLN: 5.0000 mg | INTRAVENOUS | Status: DC | PRN

## 2017-01-09 NOTE — Progress Notes (Signed)
Patient without complaints. Has remained stable overnight. Accompanied by spouse.  Vital signs in last 24 hours: Temp:  [98.5 F (36.9 C)-102.9 F (39.4 C)] 98.5 F (36.9 C) (12/25 1200) Pulse Rate:  [91-202] 95 (12/25 1200) Resp:  [18-34] 18 (12/25 1200) BP: (109-164)/(69-106) 109/74 (12/25 1000) SpO2:  [92 %-100 %] 98 % (12/25 1200) Last BM Date: 01/09/17 General:  Awake. Attentive. Communicates he has no complaints.  Abdomen: Nondistended. Positive bowel sounds., without guarding, and without rebound.  No mass or organomegaly. Extremities:  Without clubbing or edema.    Intake/Output from previous day: 12/24 0701 - 12/25 0700 In: 4166.2 [I.V.:3916.2; IV Piggyback:250] Out: 650 [Urine:650] Intake/Output this shift: Total I/O In: 897 [I.V.:797; IV Piggyback:100] Out: -   Lab Results: Recent Labs    01/08/17 0020 01/08/17 0646 01/09/17 0654  WBC 23.8* 20.8* 25.2*  HGB 14.7 13.9 13.6  HCT 41.7 40.1 39.0  PLT 221 185 179   BMET Recent Labs    01/08/17 0020 01/08/17 0646 01/09/17 0654  NA 141 138 135  K 4.4 3.6 3.7  CL 105 104 104  CO2 25 23 19*  GLUCOSE 125* 113* 89  BUN 9 7 7   CREATININE 0.48* 0.48* 0.49*  CALCIUM 9.0 8.4* 8.3*   LFT Recent Labs    01/08/17 0020  PROT 7.4  ALBUMIN 3.8  AST 17  ALT 22  ALKPHOS 77  BILITOT 1.0    Impression:  Pleasant 56 year old gentleman with rapidly progressing ALS, severe oropharyngeal dysfunction admitted with urosepsis. PEG tube desired. He remains intermittently tachycardic; he has a significant leukocytosis and has had a dip in his bicarbonate.  Recommendations:   PEG tube placement tentatively planned for tomorrow. However, we may need to put it off given ongoing infection. We'll reassess tomorrow morning. Discussed progress with patient and spouse.

## 2017-01-09 NOTE — Progress Notes (Addendum)
Cardiology offices closed. Left message with Loraine LericheMark, Operator 1 at [facility] about new cardiology consult for recurrent SVT and preop clearance for PEG placement. Told to call tomorrow for a cardiology consult.

## 2017-01-09 NOTE — Progress Notes (Signed)
PROGRESS NOTE   Steve ShadCharles Koch  JWJ:[id number]:[id number]  DOB: 02/18/1960  DOA: 01/07/2017 [facility]  Brief Admission Hx: Steve Koch is a 56 y.o. male with medical history significant of recently diagnosed and rapidly progressive ALS, anxiety, right hand carpal tunnel syndrome, GERD, hypertension, postoperative nausea and vomiting who was brought to the emergency department by family members due to fever today associated with hematuria, nausea and one episode of emesis the previous night, urinary frequency and decreased urinary output.  He was diagnosed with severe sepsis, UTI, dehydration and aspiration pneumonia.    MDM/Assessment & Plan:   1. Severe sepsis secondary to UTI - continue IV ceftriaxone pending urine culture and sensitivities.  Continue supportive care with IVF hydration.  2. Aspiration pneumonia - added anaerobic coverage with metronidazole, Keep patient NPO as he is high aspiration risk.  The family decided to pursue PEG placement for nutrition but this will not lower his aspiration risk and that was explained to family and patient and they verbalized understanding.  3. ALS - has been rapidly progressive.  Keep NPO for now.  Lorazepam ordered as needed for spasticity.  I feel that palliative medicine should see him as I'm concerned given his rapidly progressive disease.  4. Sinus tachycardia - suspect secondary to sepsis.  Treating supportively.  5. SVT - Pt had a couple of recurrent episodes overnight. It responded to carotid massage and IV lopressor.  I will ask for cardiology to see him to be sure it will be safe for him to undergo PEG placement in next 1-2 days.   6. Essential Hypertension - Has not required blood pressure medication, will follow.  He is NPO and will need to be given IV if needed.  7. GERD - protonix IV ordered for GI protection.  8. GAD - paroxetine on hold while NPO.  Lorazepam ordered as needed.  9. Leukocytosis - WBC slightly  up, but suspect patient aspirated yesterday.  Will follow CBC/diff.    DVT prophylaxis: lovenox Code Status: DNR Family Communication: wife at bedside Disposition Plan: TBD  Subjective: Pt says he has to "poo", otherwise no complaints.   Objective: Vitals:   01/09/17 0600 01/09/17 0603 01/09/17 0604 01/09/17 0605  BP: 112/77 125/88    Pulse: (!) 202 (!) 102 98 91  Resp: (!) 26 (!) 23 (!) 25 (!) 24  Temp:      TempSrc:      SpO2: 100% 100% 99% 99%  Weight:      Height:        Intake/Output Summary (Last 24 hours) at 01/09/2017 [id number] Last data filed at 01/09/2017 0400 Gross per 24 hour  Intake 3996.17 ml  Output 650 ml  Net 3346.17 ml   Filed Weights   01/08/17 0351  Weight: 93.6 kg (206 lb 5.6 oz)   REVIEW OF SYSTEMS  As per history otherwise all reviewed and reported negative  Exam:  General exam: Pt awake, alert, no distress, lying in bed. Flat affect.  Respiratory system: shallow. mild diminished BS LLL. No increased work of breathing. Cardiovascular system: S1 & S2 heard, tachycardic. R. No JVD, murmurs, gallops, clicks or pedal edema. Gastrointestinal system: Abdomen is nondistended, soft and nontender. Normal bowel sounds heard. Central nervous system: spastic quadriparesis, nonverbal.  Extremities: no CCE.  Data Reviewed: Basic Metabolic Panel: Recent Labs  Lab 01/08/17 0020 01/08/17 0646  NA 141 138  K 4.4 3.6  CL 105 104  CO2 25 23  GLUCOSE 125* 113*  BUN 9 7  CREATININE 0.48* 0.48*  CALCIUM 9.0 8.4*  MG 1.9 1.7  PHOS 2.6 2.5   Liver Function Tests: Recent Labs  Lab 01/08/17 0020  AST 17  ALT 22  ALKPHOS 77  BILITOT 1.0  PROT 7.4  ALBUMIN 3.8   No results for input(s): LIPASE, AMYLASE in the last 168 hours. No results for input(s): AMMONIA in the last 168 hours. CBC: Recent Labs  Lab 01/08/17 0020 01/08/17 0646 01/09/17 0654  WBC 23.8* 20.8* 25.2*  NEUTROABS 20.9* 18.7* 21.8*  HGB 14.7 13.9 13.6  HCT 41.7 40.1 39.0  MCV  85.6 85.7 85.2  PLT 221 185 179   Cardiac Enzymes: No results for input(s): CKTOTAL, CKMB, CKMBINDEX, TROPONINI in the last 168 hours. CBG (last 3)  No results for input(s): GLUCAP in the last 72 hours. Recent Results (from the past 240 hour(s))  Blood Culture (routine x 2)     Status: None (Preliminary result)   Collection Time: 01/08/17 12:20 AM  Result Value Ref Range Status   Specimen Description RIGHT ANTECUBITAL  Final   Special Requests   Final    BOTTLES DRAWN AEROBIC AND ANAEROBIC Blood Culture adequate volume   Culture NO GROWTH 1 DAY  Final   Report Status PENDING  Incomplete  Urine culture     Status: None (Preliminary result)   Collection Time: 01/08/17 12:41 AM  Result Value Ref Range Status   Specimen Description URINE, CLEAN CATCH  Final   Special Requests NONE  Final   Culture PENDING  Incomplete   Report Status PENDING  Incomplete  Blood Culture (routine x 2)     Status: None (Preliminary result)   Collection Time: 01/08/17 12:42 AM  Result Value Ref Range Status   Specimen Description BLOOD RIGHT HAND  Final   Special Requests   Final    BOTTLES DRAWN AEROBIC ONLY Blood Culture results may not be optimal due to an inadequate volume of blood received in culture bottles   Culture NO GROWTH 1 DAY  Final   Report Status PENDING  Incomplete  MRSA PCR Screening     Status: None   Collection Time: 01/08/17  3:07 AM  Result Value Ref Range Status   MRSA by PCR NEGATIVE NEGATIVE Final    Comment:        The GeneXpert MRSA Assay (FDA approved for NASAL specimens only), is one component of a comprehensive MRSA colonization surveillance program. It is not intended to diagnose MRSA infection nor to guide or monitor treatment for MRSA infections.      Studies: Dg Chest Port 1 View  Result Date: 01/08/2017 CLINICAL DATA:  ALS.  Symptoms of dysphagia, aspiration. EXAM: PORTABLE CHEST 1 VIEW COMPARISON:  None. FINDINGS: The heart size and mediastinal contours are  within normal limits. Lung volumes are low. Bibasilar opacities, left greater than right, are likely consistent with atelectasis. Early aspiration pneumonia cannot be excluded. The right hemidiaphragm is elevated. No acute rod, pleural fluid or pneumothorax identified. The visualized skeletal structures are unremarkable. IMPRESSION: Low lung volumes with bibasilar opacities likely representing atelectasis. Early aspiration pneumonia cannot be excluded, especially on the left. Electronically Signed   By: Irish Lack M.D.   On: 01/08/2017 17:52   Scheduled Meds: . enoxaparin (LOVENOX) injection  40 mg Subcutaneous Q24H  . mouth rinse  15 mL Mouth Rinse BID  . pantoprazole (PROTONIX) IV  40 mg Intravenous Q24H   Continuous Infusions: . sodium chloride 170 mL/hr at 01/09/17  [id number]  . cefTRIAXone (ROCEPHIN)  IV Stopped (01/09/17 0154)  . metronidazole Stopped (01/09/17 0400)    Principal Problem:   Sepsis secondary to UTI [facility](HCC) Active Problems:   Essential hypertension   Vitamin B 12 deficiency   Generalized anxiety disorder   GERD (gastroesophageal reflux disease)   ALS (amyotrophic lateral sclerosis) (HCC)   Sinus tachycardia   Oropharyngeal dysphagia   Loss of weight   Malnutrition of moderate degree  Critical Care Time spent: 34 mins  Standley Dakinslanford Johnson, MD, FAAFP [facility] Pager [phone]  If 7PM-7AM, please contact night-coverage [email or web address] Password [email or web address] 01/09/2017, 7:28 AM    LOS: 1 day

## 2017-01-10 ENCOUNTER — Encounter (HOSPITAL_COMMUNITY): Payer: Self-pay | Admitting: Physician Assistant

## 2017-01-10 ENCOUNTER — Inpatient Hospital Stay (HOSPITAL_COMMUNITY): Payer: Medicare Other

## 2017-01-10 DIAGNOSIS — E538 Deficiency of other specified B group vitamins: Secondary | ICD-10-CM

## 2017-01-10 DIAGNOSIS — I471 Supraventricular tachycardia: Secondary | ICD-10-CM

## 2017-01-10 DIAGNOSIS — Z7189 Other specified counseling: Secondary | ICD-10-CM

## 2017-01-10 DIAGNOSIS — Z515 Encounter for palliative care: Secondary | ICD-10-CM

## 2017-01-10 DIAGNOSIS — R000 Tachycardia, unspecified: Secondary | ICD-10-CM

## 2017-01-10 DIAGNOSIS — T17908A Unspecified foreign body in respiratory tract, part unspecified causing other injury, initial encounter: Secondary | ICD-10-CM

## 2017-01-10 LAB — BLOOD GAS, ARTERIAL
Acid-base deficit: 8.4 mmol/L — ABNORMAL HIGH (ref 0.0–2.0)
Bicarbonate: 18 mmol/L — ABNORMAL LOW (ref 20.0–28.0)
Drawn by: 277331
O2 CONTENT: 2 L/min
O2 SAT: 99.4 %
PATIENT TEMPERATURE: 37
pCO2 arterial: 31.3 mmHg — ABNORMAL LOW (ref 32.0–48.0)
pH, Arterial: 7.337 — ABNORMAL LOW (ref 7.350–7.450)
pO2, Arterial: 166 mmHg — ABNORMAL HIGH (ref 83.0–108.0)

## 2017-01-10 LAB — BASIC METABOLIC PANEL
Anion gap: 13 (ref 5–15)
BUN: 6 mg/dL (ref 6–20)
CHLORIDE: 108 mmol/L (ref 101–111)
CO2: 17 mmol/L — AB (ref 22–32)
CREATININE: 0.4 mg/dL — AB (ref 0.61–1.24)
Calcium: 8.3 mg/dL — ABNORMAL LOW (ref 8.9–10.3)
GFR calc non Af Amer: >60 mL/min (ref >60)
GLUCOSE: 80 mg/dL (ref 65–99)
Potassium: 3.5 mmol/L (ref 3.5–5.1)
Sodium: 138 mmol/L (ref 135–145)

## 2017-01-10 LAB — URINE CULTURE: Culture: >=100000 — AB

## 2017-01-10 LAB — CBC WITH DIFFERENTIAL/PLATELET
BASOS PCT: 0 %
Basophils Absolute: 0 10*3/uL (ref 0.0–0.1)
Eosinophils Absolute: 0.3 10*3/uL (ref 0.0–0.7)
Eosinophils Relative: 2 %
HEMATOCRIT: 37.6 % — AB (ref 39.0–52.0)
HEMOGLOBIN: 13.4 g/dL (ref 13.0–17.0)
LYMPHS ABS: 2.3 10*3/uL (ref 0.7–4.0)
LYMPHS PCT: 12 %
MCH: 30.1 pg (ref 26.0–34.0)
MCHC: 35.6 g/dL (ref 30.0–36.0)
MCV: 84.5 fL (ref 78.0–100.0)
MONOS PCT: 7 %
Monocytes Absolute: 1.2 10*3/uL (ref 0.1–1.0)
NEUTROS ABS: 14.6 10*3/uL (ref 1.7–7.7)
NEUTROS PCT: 79 %
Platelets: 219 10*3/uL (ref 150–400)
RBC: 4.45 MIL/uL (ref 4.22–5.81)
RDW: 14.5 % (ref 11.5–15.5)
WBC: 18.1 10*3/uL — ABNORMAL HIGH (ref 4.0–10.5)

## 2017-01-10 LAB — LACTIC ACID, PLASMA: Lactic Acid, Venous: 0.6 mmol/L (ref 0.5–1.9)

## 2017-01-10 MED ORDER — METOPROLOL TARTRATE 5 MG/5ML IV SOLN
2.5000 mg | Freq: Two times a day (BID) | INTRAVENOUS | Status: DC
  Administered 2017-01-10 – 2017-01-13 (×7): 2.5 mg via INTRAVENOUS
  Filled 2017-01-10 (×7): qty 5

## 2017-01-10 NOTE — Progress Notes (Signed)
Restless overnight. Received Ativan. Cardiology consultation pending.   Vital signs in last 24 hours: Temp:  [97.5 F (36.4 C)-99.6 F (37.6 C)] 97.5 F (36.4 C) (12/26 0750) Pulse Rate:  [74-117] 99 (12/26 0700) Resp:  [17-42] 19 (12/26 0700) BP: (110-166)/(73-98) 110/80 (12/26 0700) SpO2:  [92 %-100 %] 97 % (12/26 0700) Last BM Date: 01/09/17 General:   Awake/attentive. Accompanied by spouse. Abdomen:  Flat. Positive bowel sounds soft and nontender. Extremities:  Without clubbing or edema.    Intake/Output from previous day: 12/25 0701 - 12/26 0700 In: 3239.5 [I.V.:2889.5; IV Piggyback:350] Out: 1548.7 [Urine:1548.7] Intake/Output this shift: No intake/output data recorded.  Lab Results: Recent Labs    01/08/17 0646 01/09/17 0654 01/10/17 0559  WBC 20.8* 25.2* 18.1*  HGB 13.9 13.6 13.4  HCT 40.1 39.0 37.6*  PLT 185 179 219   BMET Recent Labs    01/08/17 0646 01/09/17 0654 01/10/17 0559  NA 138 135 138  K 3.6 3.7 3.5  CL 104 104 108  CO2 23 19* 17*  GLUCOSE 113* 89 80  BUN 7 7 6   CREATININE 0.48* 0.49* 0.40*  CALCIUM 8.4* 8.3* 8.3*   LFT Recent Labs    01/08/17 0020  PROT 7.4  ALBUMIN 3.8  AST 17  ALT 22  ALKPHOS 77  BILITOT 1.0   PT/INR No results for input(s): LABPROT, INR in the last 72 hours. Hepatitis Panel No results for input(s): HEPBSAG, HCVAB, HEPAIGM, HEPBIGM in the last 72 hours. C-Diff No results for input(s): CDIFFTOX in the last 72 hours.  Studies/Results: Dg Chest Port 1 View  Result Date: 01/10/2017 CLINICAL DATA:  Aspiration pneumonia. EXAM: PORTABLE CHEST 1 VIEW COMPARISON:  01/08/2017 FINDINGS: There is persistent bibasilar atelectasis at least in part due to a shallow inspiration. No significant change. Heart size and vascularity are normal. No significant bone abnormality. IMPRESSION: No change in the appearance of the chest. Bibasilar atelectasis, at least in part due to a shallow inspiration. No lung consolidation.  Electronically Signed   By: Francene BoyersJames  Maxwell M.D.   On: 01/10/2017 07:55   Dg Chest Port 1 View  Result Date: 01/08/2017 CLINICAL DATA:  ALS.  Symptoms of dysphagia, aspiration. EXAM: PORTABLE CHEST 1 VIEW COMPARISON:  None. FINDINGS: The heart size and mediastinal contours are within normal limits. Lung volumes are low. Bibasilar opacities, left greater than right, are likely consistent with atelectasis. Early aspiration pneumonia cannot be excluded. The right hemidiaphragm is elevated. No acute rod, pleural fluid or pneumothorax identified. The visualized skeletal structures are unremarkable. IMPRESSION: Low lung volumes with bibasilar opacities likely representing atelectasis. Early aspiration pneumonia cannot be excluded, especially on the left. Electronically Signed   By: Irish LackGlenn  Yamagata M.D.   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 Intermittent tachycardia likely a response to SIRS. I do not feel that he is improved to the point we can proceed with the EGD/ PEG placement at this time.  Recommendations:    We'll reassess tomorrow morning. Lovenox held. SCD's for now.  Discuss with patient and spouse.

## 2017-01-10 NOTE — Consult Note (Signed)
Consultation Note Date: 01/10/2017   Patient Name: Steve Koch  DOB: September 28, 1960  MRN: [id number]  Age / Sex: 56 y.o., male  PCP: [facility], [facility] Referring Physician: Cleora FleetJohnson, Clanford L, MD  Reason for Consultation: Establishing goals of care and Psychosocial/spiritual support  HPI/Patient Profile: 56 y.o. male  with past medical history of ALS with fast progression diagnosed July 2018, falls related to ALS, high blood pressure, GERD, carpal tunnel syndrome, cervical disc repair February 2018 admitted on 01/07/2017 with sepsis secondary to UTI.   Clinical Assessment and Goals of Care: Steve Koch is lying quietly in bed.  He is nonverbal, and unable to speak to me.  His wife, Alvis LemmingsDawn, is at bedside.  We talked about Steve Koch's current health concerns including increased white blood cells, plan for PEG tube when stable.  Steve Koch states that [facility] were to [facility], [facility] on Monday, but they had to come to the hospital.  Do not states that Steve Koch did not want to come to the hospital, but she believes this is because "he just wanted to get through Christmas".  She talks about the fast progression of [name] ALS.  She states that he had a neck surgery in February, fell 5 times, and was diagnosed with ALS sometime in July.  She states that he had a weight of 250 pounds in June.  [facility]  Steve states that she is working at [employer], and Mr. Gorder goes to work with her in his [employer]lectric wheelchair daily.  She tearfully tells a story of a handicapped accessible Zenaida Niecevan being given to them.  We talked about FMLA, but she shares that she needs to work to provide income for the home, and afford health insurance.  We talked about managing home life.  Until recently,  Steve Koch was able to get in the shower weekly, sponge baths other times.  Due to progression of ALS, this may be dangerous.  Steve tells of adaptations she has made to provide care for her husband.  I encouraged him to ask for help from family, and we talked about strategy to help with this.  We also talked in detail, and repeatedly, about leaning on the hospice provider.  We talked about what is next, including being watchful for skin breakdown, shifting positions, urinary management including condom catheter/Foley catheter, stasis pneumonia, aspiration pneumonia.  We talked about what is normal and expected, even with the best of care.  They share that Steve Koch's preferred place of death is at his home.  Steve shares that she cared for her mother in her home until her mother passed.  We talked about prognosis with permission, likely months.  We agreed to a follow-up meeting tomorrow.  Healthcare power of attorney NEXT OF KIN -wife of 18 years, Steve Koch.  Steve has 2 adult daughters.  Steve MostCharles has (at least) one brother.   SUMMARY  OF RECOMMENDATIONS   At this point, continue to treat the treatable but no CPR, no intubation. Patient and wife are requesting PEG tube for nutrition and hydration. [facility], provider located in [place].  Code Status/Advance Care Planning:  DNR  Symptom Management:   Per hospitalist, no additional needs at this time.  Palliative Prophylaxis:   Aspiration and Turn Reposition  Additional Recommendations (Limitations, Scope, Preferences):  Continue to treat the treatable but no CPR, no intubation, no trach  Psycho-social/Spiritual:   Desire for further Chaplaincy support:no  Additional Recommendations: Caregiving  Support/Resources and Education on Hospice  Prognosis:   < 6 months, would not be surprising based on fast progression of ALS leading to basically bedbound/wheelchair bound status, UTI with sepsis,  risk for aspiration.  Discharge Planning: Patient and family is requesting to return home with the benefits of [facility], [facility] provider.      Primary Diagnoses: Present on Admission: . Sepsis secondary to UTI (HCC) . Essential hypertension . GERD (gastroesophageal reflux disease) . Generalized anxiety disorder . Vitamin B 12 deficiency . ALS (amyotrophic lateral sclerosis) (HCC) . Sinus tachycardia   I have reviewed the medical record, interviewed the patient and family, and examined the patient. The following aspects are pertinent.  Past Medical History:  Diagnosis Date  . ALS (amyotrophic lateral sclerosis) (HCC)   . Anxiety    situational with injury  . B12 deficiency   . Carpal tunnel syndrome    right hand  . GERD (gastroesophageal reflux disease)   . Hypertension   . PONV (postoperative nausea and vomiting)    Social History   Socioeconomic History  . Marital status: Married    Spouse name: None  . Number of children: None  . Years of education: None  . Highest education level: None  Social Needs  . Financial resource strain: None  . Food insecurity - worry: None  . Food insecurity - inability: None  . Transportation needs - medical: None  . Transportation needs - non-medical: None  Occupational History  . Occupation: disabled  Tobacco Use  . Smoking status: Former Smoker    Packs/day: 1.00    Years: 8.00    Pack years: 8.00    Types: Cigarettes    Last attempt to quit: 01/17/2007    Years since quitting: 9.9  . Smokeless tobacco: Never Used  . Tobacco comment: 1/2 - 1 PPD  Substance and Sexual Activity  . Alcohol use: No    Frequency: Never  . Drug use: No  . Sexual activity: None  Other Topics Concern  . None  Social History Narrative   Lives in [place], [place] with his wife, 4 cats and 3 dogs.    Previously worked as a Civil Service fast streamer for [employer].   Highest level of education:  High school   Family History  Problem Relation Age of  Onset  . Pneumonia Mother   . Hypertension Mother   . Diabetes Father   . Cancer Father   . Heart Problems Father        Had pacemaker in his 65s  . Diabetes Brother   . Healthy Daughter   . Healthy Son    Scheduled Meds: . mouth rinse  15 mL Mouth Rinse BID  . metoprolol tartrate  2.5 mg Intravenous Q12H  . pantoprazole (PROTONIX) IV  40 mg Intravenous Q24H   Continuous Infusions: . sodium chloride 125 mL/hr at 01/10/17 0754  . cefTRIAXone (ROCEPHIN)  IV  Stopped (01/10/17 0125)  . metronidazole Stopped (01/10/17 1122)   PRN Meds:.acetaminophen, ketorolac, LORazepam, metoprolol tartrate, sodium chloride, zolpidem Medications Prior to Admission:  Prior to Admission medications   Medication Sig Start Date End Date Taking? Authorizing Provider  aspirin EC 81 MG tablet Take 81 mg by mouth daily.   Yes [provider]  baclofen (LIORESAL) 10 MG tablet Take 1 tablet (10 mg total) by mouth 4 (four) times daily. Patient taking differently: Take 10 mg by mouth 2 (two) times daily. At bedtime 07/14/16  Yes Love, Evlyn Kanneramela S, PA-C  omeprazole (PRILOSEC) 40 MG capsule Take 40 mg by mouth daily.   Yes [provider]  PARoxetine (PAXIL) 20 MG tablet Take 20 mg by mouth daily.   Yes [provider]  vitamin B-12 500 MCG tablet Take 1 tablet (500 mcg total) by mouth daily. 07/15/16  Yes Love, Evlyn KannerPamela S, PA-C  zolpidem (AMBIEN) 10 MG tablet Take 10 mg by mouth at bedtime as needed for sleep.   Yes [provider]  amLODipine (NORVASC) 2.5 MG tablet Take 1 tablet (2.5 mg total) by mouth daily. 07/14/16   Love, Evlyn KannerPamela S, PA-C  fluticasone (FLONASE) 50 MCG/ACT nasal spray Place 1 spray into both nostrils daily. 07/15/16   Love, Evlyn KannerPamela S, PA-C  traZODone (DESYREL) 50 MG tablet Take 1 tablet (50 mg total) by mouth at bedtime as needed for sleep. 07/14/16   Jacquelynn CreeLove, Pamela S, PA-C   Allergies  Allergen Reactions  . Lyrica [Pregabalin] Swelling   Review of Systems  Unable to  perform ROS: Patient nonverbal    Physical Exam  Constitutional: No distress.  HENT:  Head: Normocephalic and atraumatic.  Cardiovascular: Normal rate and regular rhythm.  Pulmonary/Chest: Effort normal. No respiratory distress.  Shallow breathing, no respiratory distress  Abdominal: Soft. He exhibits no distension.  Musculoskeletal: He exhibits no edema.  Neurological: He is alert.  Nonverbal due to ALS  Skin: Skin is warm and dry.  Nursing note and vitals reviewed.   Vital Signs: BP 134/82   Pulse 82   Temp 97.9 F (36.6 C) (Axillary)   Resp (!) 23   Ht 6\' 1"  (1.854 m)   Wt 93.6 kg (206 lb 5.6 oz)   SpO2 100%   BMI 27.22 kg/m  Pain Assessment: No/denies pain   Pain Score: 0-No pain   SpO2: SpO2: 100 % O2 Device:SpO2: 100 % O2 Flow Rate: .O2 Flow Rate (L/min): 0 L/min  IO: Intake/output summary:   Intake/Output Summary (Last 24 hours) at 01/10/2017 1253 Last data filed at 01/10/2017 1200 Gross per 24 hour  Intake 3342.5 ml  Output 1548.7 ml  Net 1793.8 ml    LBM: Last BM Date: 01/09/17 Baseline Weight: Weight: 93.6 kg (206 lb 5.6 oz) Koch recent weight: Weight: 93.6 kg (206 lb 5.6 oz)     Palliative Assessment/Data:   Flowsheet Rows     Koch Recent Value  Intake Tab  Referral Department  Hospitalist  Unit at Time of Referral  ICU  Palliative Care Primary Diagnosis  Sepsis/Infectious Disease  Date Notified  01/10/17  Palliative Care Type  New Palliative care  Reason for referral  Clarify Goals of Care, Psychosocial or Spiritual support  Date of Admission  01/07/17  Date first seen by Palliative Care  01/10/17  # of days Palliative referral response time  0 Day(s)  # of days IP prior to Palliative referral  3  Clinical Assessment  Palliative Performance Scale Score  30%  Pain Max last 24 hours  Not able to report  Pain Min Last 24 hours  Not able to report  Dyspnea Max Last 24 Hours  Not able to report  Dyspnea Min Last 24 hours  Not able to  report  Psychosocial & Spiritual Assessment  Palliative Care Outcomes  Patient/Family meeting held?  Yes  Who was at the meeting?  Patient and wife at bedside.  Palliative Care Outcomes  Counseled regarding hospice, Provided advance care planning, Clarified goals of care, Provided psychosocial or spiritual support  Patient/Family wishes: Interventions discontinued/not started   Mechanical Ventilation      Time In: 1005 Time Out: 1135 Time Total: 90 minutes Greater than 50%  of this time was spent counseling and coordinating care related to the above assessment and plan.  Signed by: Katheran Awe, NP   Please contact Palliative Medicine Team phone at [phone] for questions and concerns.  For individual provider: See Loretha Stapler

## 2017-01-10 NOTE — Consult Note (Signed)
Cardiology Consultation:   Patient ID: Steve Koch; [id number]; 1960-07-22   Admit date: 01/07/2017 Date of Consult: 01/10/2017  Primary Care Provider: [facility] Primary Cardiologist: New to Dr. Tenny Craw  Chief Complaint: fever  Patient Profile:   Steve Koch is a 56 y.o. male with a hx of ALS, anxiety, GERD, HTN, former tobacco abuse, vitamin B12 deficiency who is being seen today for the evaluation of SVT at the request of Dr. Laural Benes.  History of Present Illness:   He was admitted with fever, dysuria, urinary discoloration, nausea, vomiting, constipation. He was found to have severe sepsis due to UTI, dehydration, and aspiration pneumonia. He is being treated with IV fluids and antibiotics. Primary team has also consulted GI to consider PEG tube placement for nutrition and also feels palliative medicine should see the patient given concern for rapid disease progression. The patient is nonverbal but able to communicate via facial expressions. His wife is particularly receptive to this. He has no prior cardiac history but his wife did recall in the past he had episodes of his stomach spasming then having racing heartbeat. He's not had formal eval for this in the past. Yesterday in the early AM hours he had an episode of rapid SVT (same morphology as native QRS) that lasted approximately 8 minutes around 6am. IM notes indicate this resolved with carotid massage and IV Lopressor (5mg  given at 6am). He was not acutely symptomatic with this. No chest pain or dyspnea reported. Pertinent labs otherwise include K 3.5-4.4, Cr 0.4, normal LFTs, Mg 1.9. 2D Echo 01/08/17 shows mild LVH, EF 60-65%.   Past Medical History:  Diagnosis Date  . ALS (amyotrophic lateral sclerosis) (HCC)   . Anxiety    situational with injury  . B12 deficiency   . Carpal tunnel syndrome    right hand  . GERD (gastroesophageal reflux disease)   . Hypertension   . PONV (postoperative nausea  and vomiting)     Past Surgical History:  Procedure Laterality Date  . ANTERIOR CERVICAL DECOMP/DISCECTOMY FUSION N/A 03/09/2016   Procedure: Anterior Cervical Discectomy Fusion Cervial 5-7;  Surgeon: Venita Lick, MD;  Location: [facility] OR;  Service: Orthopedics;  Laterality: N/A;  Requests for 3.5 hrs  . BACK SURGERY  1995  . COLONOSCOPY    . HAND SURGERY Left   . HERNIA REPAIR Right    inguinal  . KNEE ARTHROSCOPY Right   . NECK SURGERY  03/09/2016   C5-C7     Inpatient Medications: Scheduled Meds: . mouth rinse  15 mL Mouth Rinse BID  . pantoprazole (PROTONIX) IV  40 mg Intravenous Q24H   Continuous Infusions: . sodium chloride 125 mL/hr at 01/10/17 0754  . cefTRIAXone (ROCEPHIN)  IV Stopped (01/10/17 0125)  . metronidazole Stopped (01/10/17 0320)   PRN Meds: acetaminophen, ketorolac, LORazepam, metoprolol tartrate, sodium chloride, zolpidem  Home Meds: Prior to Admission medications   Medication Sig Start Date End Date Taking? Authorizing Provider  aspirin EC 81 MG tablet Take 81 mg by mouth daily.   Yes [provider]  baclofen (LIORESAL) 10 MG tablet Take 1 tablet (10 mg total) by mouth 4 (four) times daily. Patient taking differently: Take 10 mg by mouth 2 (two) times daily. At bedtime 07/14/16  Yes Love, Evlyn Kanner, PA-C  omeprazole (PRILOSEC) 40 MG capsule Take 40 mg by mouth daily.   Yes [provider]  PARoxetine (PAXIL) 20 MG tablet Take 20 mg by mouth daily.   Yes [provider]  vitamin B-12 500 MCG tablet Take 1 tablet (500 mcg total) by mouth daily. 07/15/16  Yes Love, Evlyn Kanner, PA-C  zolpidem (AMBIEN) 10 MG tablet Take 10 mg by mouth at bedtime as needed for sleep.   Yes [provider]  amLODipine (NORVASC) 2.5 MG tablet Take 1 tablet (2.5 mg total) by mouth daily. 07/14/16   Love, Evlyn Kanner, PA-C  fluticasone (FLONASE) 50 MCG/ACT nasal spray Place 1 spray into both nostrils daily. 07/15/16   Love, Evlyn Kanner, PA-C  traZODone  (DESYREL) 50 MG tablet Take 1 tablet (50 mg total) by mouth at bedtime as needed for sleep. 07/14/16   Jacquelynn Cree, PA-C    Allergies:    Allergies  Allergen Reactions  . Lyrica [Pregabalin] Swelling    Social History:   Social History   Socioeconomic History  . Marital status: Married    Spouse name: Not on file  . Number of children: Not on file  . Years of education: Not on file  . Highest education level: Not on file  Social Needs  . Financial resource strain: Not on file  . Food insecurity - worry: Not on file  . Food insecurity - inability: Not on file  . Transportation needs - medical: Not on file  . Transportation needs - non-medical: Not on file  Occupational History  . Occupation: disabled  Tobacco Use  . Smoking status: Former Smoker    Packs/day: 1.00    Years: 8.00    Pack years: 8.00    Types: Cigarettes    Last attempt to quit: 01/17/2007    Years since quitting: 9.9  . Smokeless tobacco: Never Used  . Tobacco comment: 1/2 - 1 PPD  Substance and Sexual Activity  . Alcohol use: No    Frequency: Never  . Drug use: No  . Sexual activity: Not on file  Other Topics Concern  . Not on file  Social History Narrative   Lives in [place], [place] with his wife, 4 cats and 3 dogs.    Previously worked as a Civil Service fast streamer for [employer].   Highest level of education:  High school    Family History:   The patient's family history includes Cancer in his father; Diabetes in his brother and father; Healthy in his daughter and son; Heart Problems in his father; Hypertension in his mother; Pneumonia in his mother.  ROS:  Please see the history of present illness.  All other ROS reviewed and negative.     Physical Exam/Data:   Vitals:   01/10/17 0400 01/10/17 0600 01/10/17 0700 01/10/17 0750  BP: 121/78 120/73 110/80   Pulse: (!) 101 96 99   Resp: (!) 22 20 19    Temp: 98.4 F (36.9 C)   (!) 97.5 F (36.4 C)  TempSrc: Axillary   Axillary  SpO2: 95% 97% 97%   Weight:       Height:        Intake/Output Summary (Last 24 hours) at 01/10/2017 0849 Last data filed at 01/10/2017 0700 Gross per 24 hour  Intake 3069.5 ml  Output 1548.7 ml  Net 1520.8 ml   Filed Weights   01/08/17 0351  Weight: 206 lb 5.6 oz (93.6 kg)   Body mass index is 27.22 kg/m.  General: Well developed, well nourished WM, in no acute distress. Head: Normocephalic, atraumatic, sclera non-icteric, no xanthomas, nares are without discharge.  Neck: Negative for carotid bruits. JVD not elevated. Lungs: Clear bilaterally to auscultation without wheezes, rales, or  rhonchi. Breathing is unlabored. Heart: RRR with S1 S2. No murmurs, rubs, or gallops appreciated. Abdomen: Soft, non-tender, non-distended with normoactive bowel sounds. No hepatomegaly. No rebound/guarding. No obvious abdominal masses. Msk:  Strength and tone appear normal for age. Extremities: No clubbing or cyanosis. No edema.  Distal pedal pulses are 2+ and equal bilaterally. Neuro: Nonverbal but able to communicate via facial expressions, slight nods Psych:  Flat affect  EKG:  The EKG was personally reviewed and demonstrates sinus tach 127bpm, possible prior inferior infarct, nonspecific ST-T changes   Laboratory Data:  Chemistry Recent Labs  Lab 01/08/17 0646 01/09/17 0654 01/10/17 0559  NA 138 135 138  K 3.6 3.7 3.5  CL 104 104 108  CO2 23 19* 17*  GLUCOSE 113* 89 80  BUN 7 7 6   CREATININE 0.48* 0.49* 0.40*  CALCIUM 8.4* 8.3* 8.3*  GFRNONAA >60 >60 >60  GFRAA >60 >60 >60  ANIONGAP 11 12 13     Recent Labs  Lab 01/08/17 0020  PROT 7.4  ALBUMIN 3.8  AST 17  ALT 22  ALKPHOS 77  BILITOT 1.0   Hematology Recent Labs  Lab 01/08/17 0646 01/09/17 0654 01/10/17 0559  WBC 20.8* 25.2* 18.1*  RBC 4.68 4.58 4.45  HGB 13.9 13.6 13.4  HCT 40.1 39.0 37.6*  MCV 85.7 85.2 84.5  MCH 29.7 29.7 30.1  MCHC 34.7 34.9 35.6  RDW 14.5 14.5 14.5  PLT 185 179 219   Cardiac EnzymesNo results for input(s):  TROPONINI in the last 168 hours. No results for input(s): TROPIPOC in the last 168 hours.  BNPNo results for input(s): BNP, PROBNP in the last 168 hours.  DDimer No results for input(s): DDIMER in the last 168 hours.  Radiology/Studies:  Dg Chest Port 1 View  Result Date: 01/10/2017 CLINICAL DATA:  Aspiration pneumonia. EXAM: PORTABLE CHEST 1 VIEW COMPARISON:  01/08/2017 FINDINGS: There is persistent bibasilar atelectasis at least in part due to a shallow inspiration. No significant change. Heart size and vascularity are normal. No significant bone abnormality. IMPRESSION: No change in the appearance of the chest. Bibasilar atelectasis, at least in part due to a shallow inspiration. No lung consolidation. Electronically Signed   By: Francene BoyersJames  Maxwell M.D.   On: 01/10/2017 07:55   Dg Chest Port 1 View  Result Date: 01/08/2017 CLINICAL DATA:  ALS.  Symptoms of dysphagia, aspiration. EXAM: PORTABLE CHEST 1 VIEW COMPARISON:  None. FINDINGS: The heart size and mediastinal contours are within normal limits. Lung volumes are low. Bibasilar opacities, left greater than right, are likely consistent with atelectasis. Early aspiration pneumonia cannot be excluded. The right hemidiaphragm is elevated. No acute rod, pleural fluid or pneumothorax identified. The visualized skeletal structures are unremarkable. IMPRESSION: Low lung volumes with bibasilar opacities likely representing atelectasis. Early aspiration pneumonia cannot be excluded, especially on the left. Electronically Signed   By: Irish LackGlenn  Yamagata M.D.   On: 01/08/2017 17:52    Assessment and Plan:   1. Sepsis due to UTI, also aspiration PNA in setting of progressive ALS - per IM.  2. Paroxysmal SVT - one episode noted 12/25 at 5:52 AM. No formal history of such but wife does make mention of prior history of stomach spasms associated with rapid HR. This episode was preceded by coughing. Will add TSH to next labs in AM. Lytes have been variable. LVEF is  normal. Blood pressure low-normal this AM. I will review with Dr. Tenny Crawoss, including consideration of addition of scheduled IV metoprolol (such as 2.5mg  q6hr).  3. HTN - as above, BP labile but generally low-normal this AM.  For questions or updates, please contact [facility] Please consult [email or web address] for contact info under Cardiology/STEMI.    Signed, Laurann Montanaayna N Dunn, PA-C  01/10/2017 8:49 AM  Pt seen and examined  I Agree with findings as tnoed by D Dunn above  Pt is an unfortunate 56 yo with ALS  Presents with Sepsis/UTI/ posslbe pneumonia Yesterday had 8 min episode of SVT  (HR to 170)  Given carotid massage and IV lopressor  Broke  On exam, Neck:  JVP normal.  Lungs with decreased BS at R base  Cardiac RRR  No signif murmurs  Ext without signif edema   PSVT  Spell responded / converted yesterday.  I would follow  Can use low dose b blocker lopressor 2.5 q 12 hours IV now  Follow on telemetray  Pt is OK to proceed with GI/PEG placement.    WIll continue to follow.  Steve Koch

## 2017-01-10 NOTE — Progress Notes (Signed)
PROGRESS NOTE   Steve Koch  MVH:[id number]  DOB: 1960/12/16  DOA: 01/07/2017 [facility]  Brief Admission Hx: Steve Koch is a 56 y.o. male with medical history significant of recently diagnosed and rapidly progressive ALS, anxiety, right hand carpal tunnel syndrome, GERD, hypertension, postoperative nausea and vomiting who was brought to the emergency department by family members due to fever today associated with hematuria, nausea and one episode of emesis the previous night, urinary frequency and decreased urinary output.  He was diagnosed with severe sepsis, UTI, dehydration and aspiration pneumonia.    MDM/Assessment & Plan:   1. Severe sepsis secondary to E coli UTI - continue IV ceftriaxone pending urine culture and sensitivities.  Continue supportive care with IVF hydration.  2. Aspiration pneumonia - added anaerobic coverage with metronidazole, Keep patient NPO as he is high aspiration risk.  The family decided to pursue PEG placement for nutrition but this will not lower his aspiration risk and that was explained to family and patient and they verbalized understanding.   3. ALS - has been rapidly progressive.  Keep NPO for now.  Lorazepam ordered as needed for spasticity.  I feel that palliative medicine should see him as I'm concerned given his rapidly progressive disease.  4. Sinus tachycardia - suspect secondary to sepsis.  Treating supportively.  5. SVT - Pt had a couple of recurrent episodes overnight. It responded to carotid massage and IV lopressor.  I will ask for cardiology to see him to be sure it will be safe for him to undergo PEG placement in next 1-2 days.   6. Essential Hypertension - Has not required blood pressure medication, will follow.  He is NPO and will need to be given IV if needed.  7. GERD - protonix IV ordered for GI protection.  8. GAD - paroxetine on hold while NPO.  Lorazepam ordered as needed.  9. Leukocytosis -  following.    DVT prophylaxis: lovenox Code Status: DNR Family Communication: wife at bedside Disposition Plan: TBD  Subjective: Pt finally starting to rest well after being medicated per wife.    Objective: Vitals:   01/10/17 0209 01/10/17 0239 01/10/17 0300 01/10/17 0400  BP:   118/80 121/78  Pulse: (!) 116 (!) 111 (!) 112 (!) 101  Resp: (!) 27 (!) 22 (!) 22 (!) 22  Temp:    98.4 F (36.9 C)  TempSrc:    Axillary  SpO2: 95% 95% 92% 95%  Weight:      Height:        Intake/Output Summary (Last 24 hours) at 01/10/2017 [id number] Last data filed at 01/10/2017 0400 Gross per 24 hour  Intake 3409.5 ml  Output 98.7 ml  Net 3310.8 ml   Filed Weights   01/08/17 0351  Weight: 93.6 kg (206 lb 5.6 oz)   REVIEW OF SYSTEMS  As per history otherwise all reviewed and reported negative  Exam:  General exam: Pt awake, alert, no distress, lying in bed. Flat affect.  Respiratory system: shallow. mild diminished BS LLL. No increased work of breathing. Cardiovascular system: S1 & S2 heard, tachycardic. R. No JVD, murmurs, gallops, clicks or pedal edema. Gastrointestinal system: Abdomen is nondistended, soft and nontender. Normal bowel sounds heard. Central nervous system: spastic quadriparesis, nonverbal.  Extremities: no CCE.  Data Reviewed: Basic Metabolic Panel: Recent Labs  Lab 01/08/17 0020 01/08/17 0646 01/09/17 0654  NA 141 138 135  K 4.4 3.6 3.7  CL 105 104 104  CO2 25 23  19*  GLUCOSE 125* 113* 89  BUN 9 7 7   CREATININE 0.48* 0.48* 0.49*  CALCIUM 9.0 8.4* 8.3*  MG 1.9 1.7  --   PHOS 2.6 2.5  --    Liver Function Tests: Recent Labs  Lab 01/08/17 0020  AST 17  ALT 22  ALKPHOS 77  BILITOT 1.0  PROT 7.4  ALBUMIN 3.8   No results for input(s): LIPASE, AMYLASE in the last 168 hours. No results for input(s): AMMONIA in the last 168 hours. CBC: Recent Labs  Lab 01/08/17 0020 01/08/17 0646 01/09/17 0654  WBC 23.8* 20.8* 25.2*  NEUTROABS 20.9* 18.7* 21.8*    HGB 14.7 13.9 13.6  HCT 41.7 40.1 39.0  MCV 85.6 85.7 85.2  PLT 221 185 179   Cardiac Enzymes: No results for input(s): CKTOTAL, CKMB, CKMBINDEX, TROPONINI in the last 168 hours. CBG (last 3)  No results for input(s): GLUCAP in the last 72 hours. Recent Results (from the past 240 hour(s))  Blood Culture (routine x 2)     Status: None (Preliminary result)   Collection Time: 01/08/17 12:20 AM  Result Value Ref Range Status   Specimen Description RIGHT ANTECUBITAL  Final   Special Requests   Final    BOTTLES DRAWN AEROBIC AND ANAEROBIC Blood Culture adequate volume   Culture NO GROWTH 1 DAY  Final   Report Status PENDING  Incomplete  Urine culture     Status: Abnormal (Preliminary result)   Collection Time: 01/08/17 12:41 AM  Result Value Ref Range Status   Specimen Description URINE, CLEAN CATCH  Final   Special Requests NONE  Final   Culture (A)  Final    >=100,000 COLONIES/mL ESCHERICHIA COLI SUSCEPTIBILITIES TO FOLLOW Performed at [facility], [address] [place], [place] [address]    Report Status PENDING  Incomplete  Blood Culture (routine x 2)     Status: None (Preliminary result)   Collection Time: 01/08/17 12:42 AM  Result Value Ref Range Status   Specimen Description BLOOD RIGHT HAND  Final   Special Requests   Final    BOTTLES DRAWN AEROBIC ONLY Blood Culture results may not be optimal due to an inadequate volume of blood received in culture bottles   Culture NO GROWTH 1 DAY  Final   Report Status PENDING  Incomplete  MRSA PCR Screening     Status: None   Collection Time: 01/08/17  3:07 AM  Result Value Ref Range Status   MRSA by PCR NEGATIVE NEGATIVE Final    Comment:        The GeneXpert MRSA Assay (FDA approved for NASAL specimens only), is one component of a comprehensive MRSA colonization surveillance program. It is not intended to diagnose MRSA infection nor to guide or monitor treatment for MRSA infections.      Studies: Dg Chest Port  1 View  Result Date: 01/08/2017 CLINICAL DATA:  ALS.  Symptoms of dysphagia, aspiration. EXAM: PORTABLE CHEST 1 VIEW COMPARISON:  None. FINDINGS: The heart size and mediastinal contours are within normal limits. Lung volumes are low. Bibasilar opacities, left greater than right, are likely consistent with atelectasis. Early aspiration pneumonia cannot be excluded. The right hemidiaphragm is elevated. No acute rod, pleural fluid or pneumothorax identified. The visualized skeletal structures are unremarkable. IMPRESSION: Low lung volumes with bibasilar opacities likely representing atelectasis. Early aspiration pneumonia cannot be excluded, especially on the left. Electronically Signed   By: Irish LackGlenn  Yamagata M.D.   On: 01/08/2017 17:52   Scheduled  Meds: . mouth rinse  15 mL Mouth Rinse BID  . pantoprazole (PROTONIX) IV  40 mg Intravenous Q24H   Continuous Infusions: . sodium chloride 125 mL/hr at 01/09/17 2111  . cefTRIAXone (ROCEPHIN)  IV Stopped (01/10/17 0125)  . metronidazole Stopped (01/10/17 0320)    Principal Problem:   Sepsis secondary to UTI [facility]) Active Problems:   Essential hypertension   Vitamin B 12 deficiency   Generalized anxiety disorder   GERD (gastroesophageal reflux disease)   ALS (amyotrophic lateral sclerosis) (HCC)   Sinus tachycardia   Oropharyngeal dysphagia   Loss of weight   Malnutrition of moderate degree  Critical Care Time spent: 33 mins  Standley Dakins, MD, FAAFP [facility] Pager [phone]  If 7PM-7AM, please contact night-coverage [email or web address] Password [email or web address] 01/10/2017, 6:28 AM    LOS: 2 days

## 2017-01-10 NOTE — Progress Notes (Signed)
SLP Cancellation Note  Patient Details Name: Steve ShadCharles Smalling MRN: [id number] DOB: 11-26-1960   Cancelled treatment:       Reason Eval/Treat Not Completed: Other (comment); PEG procedure not completed this date due to medical instability. Recommend continue NPO at this time and consider temporary FT (small bore feeding tube if appropriate) until PEG can be placed. SLP will follow peripherally at this time.   Thank you,  Havery MorosDabney Dishawn Bhargava, CCC-SLP [phone]    Krisna Omar 01/10/2017, 3:34 PM

## 2017-01-10 NOTE — Progress Notes (Signed)
Spoke with Steve Koch at [facility] about new cardiology consult.

## 2017-01-11 ENCOUNTER — Encounter (HOSPITAL_COMMUNITY)
Admission: EM | Disposition: A | Discharge status: Hospice | Payer: Self-pay | Source: Home / Self Care | Attending: Family Medicine

## 2017-01-11 ENCOUNTER — Inpatient Hospital Stay (HOSPITAL_COMMUNITY): Payer: Medicare Other | Admitting: Anesthesiology

## 2017-01-11 ENCOUNTER — Inpatient Hospital Stay (HOSPITAL_COMMUNITY): Payer: Medicare Other

## 2017-01-11 DIAGNOSIS — T17908S Unspecified foreign body in respiratory tract, part unspecified causing other injury, sequela: Secondary | ICD-10-CM

## 2017-01-11 DIAGNOSIS — R1310 Dysphagia, unspecified: Secondary | ICD-10-CM

## 2017-01-11 HISTORY — PX: ESOPHAGOGASTRODUODENOSCOPY (EGD) WITH PROPOFOL: SHX5813

## 2017-01-11 HISTORY — PX: PEG PLACEMENT: SHX5437

## 2017-01-11 LAB — CBC WITH DIFFERENTIAL/PLATELET
BASOS ABS: 0 10*3/uL (ref 0.0–0.1)
BASOS PCT: 0 %
Eosinophils Absolute: 0.4 10*3/uL (ref 0.0–0.7)
Eosinophils Relative: 3 %
HEMATOCRIT: 39.2 % (ref 39.0–52.0)
HEMOGLOBIN: 13.7 g/dL (ref 13.0–17.0)
Lymphocytes Relative: 16 %
Lymphs Abs: 2.3 10*3/uL (ref 0.7–4.0)
MCH: 29.1 pg (ref 26.0–34.0)
MCHC: 34.9 g/dL (ref 30.0–36.0)
MCV: 83.2 fL (ref 78.0–100.0)
Monocytes Absolute: 1 10*3/uL (ref 0.1–1.0)
Monocytes Relative: 7 %
NEUTROS ABS: 10.7 10*3/uL — AB (ref 1.7–7.7)
NEUTROS PCT: 74 %
Platelets: 275 10*3/uL (ref 150–400)
RBC: 4.71 MIL/uL (ref 4.22–5.81)
RDW: 14.4 % (ref 11.5–15.5)
WBC: 14.5 10*3/uL — ABNORMAL HIGH (ref 4.0–10.5)

## 2017-01-11 LAB — TSH: TSH: 1.648 u[IU]/mL (ref 0.350–4.500)

## 2017-01-11 LAB — GLUCOSE, CAPILLARY: Glucose-Capillary: 87 mg/dL (ref 65–99)

## 2017-01-11 SURGERY — ESOPHAGOGASTRODUODENOSCOPY (EGD) WITH PROPOFOL
Anesthesia: Monitor Anesthesia Care

## 2017-01-11 SURGERY — INSERTION, PEG TUBE
Anesthesia: Monitor Anesthesia Care

## 2017-01-11 MED ORDER — EPHEDRINE SULFATE 50 MG/ML IJ SOLN
INTRAMUSCULAR | Status: AC
  Filled 2017-01-11: qty 1

## 2017-01-11 MED ORDER — FENTANYL CITRATE (PF) 100 MCG/2ML IJ SOLN
INTRAMUSCULAR | Status: AC
  Filled 2017-01-11: qty 2

## 2017-01-11 MED ORDER — JEVITY 1.2 CAL PO LIQD
1000.0000 mL | ORAL | Status: AC
  Administered 2017-01-11: 1000 mL
  Filled 2017-01-11: qty 1000

## 2017-01-11 MED ORDER — IPRATROPIUM-ALBUTEROL 0.5-2.5 (3) MG/3ML IN SOLN
3.0000 mL | RESPIRATORY_TRACT | Status: DC
  Administered 2017-01-11 – 2017-01-12 (×7): 3 mL via RESPIRATORY_TRACT
  Filled 2017-01-11 (×7): qty 3

## 2017-01-11 MED ORDER — SODIUM CHLORIDE 0.9 % IJ SOLN
INTRAMUSCULAR | Status: AC
  Filled 2017-01-11: qty 10

## 2017-01-11 MED ORDER — SUCCINYLCHOLINE CHLORIDE 20 MG/ML IJ SOLN
INTRAMUSCULAR | Status: AC
  Filled 2017-01-11: qty 1

## 2017-01-11 MED ORDER — DEXTROSE 5 % IV SOLN
INTRAVENOUS | Status: AC
  Administered 2017-01-11: 19:00:00 via INTRAVENOUS

## 2017-01-11 MED ORDER — FENTANYL CITRATE (PF) 100 MCG/2ML IJ SOLN
25.0000 ug | Freq: Once | INTRAMUSCULAR | Status: AC
  Administered 2017-01-11: 25 ug via INTRAVENOUS

## 2017-01-11 MED ORDER — LEVALBUTEROL HCL 0.63 MG/3ML IN NEBU
0.6300 mg | INHALATION_SOLUTION | Freq: Once | RESPIRATORY_TRACT | Status: AC
  Administered 2017-01-11: 0.63 mg via RESPIRATORY_TRACT
  Filled 2017-01-11: qty 3

## 2017-01-11 MED ORDER — FLUCONAZOLE IN SODIUM CHLORIDE 200-0.9 MG/100ML-% IV SOLN
200.0000 mg | Freq: Once | INTRAVENOUS | Status: AC
  Administered 2017-01-11: 200 mg via INTRAVENOUS
  Filled 2017-01-11: qty 100

## 2017-01-11 MED ORDER — LIDOCAINE 1 % OPTIME INJ - NO CHARGE
INTRAMUSCULAR | Status: DC | PRN
  Administered 2017-01-11: 4 mL via INTRADERMAL

## 2017-01-11 MED ORDER — SODIUM CHLORIDE 0.9 % IV SOLN: INTRAVENOUS | Status: DC

## 2017-01-11 MED ORDER — PHENOL 1.4 % MT LIQD
1.0000 | OROMUCOSAL | Status: DC | PRN
  Filled 2017-01-11 (×2): qty 177

## 2017-01-11 MED ORDER — CEFAZOLIN SODIUM-DEXTROSE 1-4 GM/50ML-% IV SOLN
1.0000 g | Freq: Once | INTRAVENOUS | Status: AC
  Administered 2017-01-11: 1 g via INTRAVENOUS

## 2017-01-11 MED ORDER — LACTATED RINGERS IV SOLN
INTRAVENOUS | Status: DC
  Administered 2017-01-11: 12:00:00 via INTRAVENOUS

## 2017-01-11 MED ORDER — MIDAZOLAM HCL 2 MG/2ML IJ SOLN
INTRAMUSCULAR | Status: AC
  Filled 2017-01-11: qty 2

## 2017-01-11 MED ORDER — PROPOFOL 500 MG/50ML IV EMUL
INTRAVENOUS | Status: DC | PRN
  Administered 2017-01-11: 75 ug/kg/min via INTRAVENOUS

## 2017-01-11 MED ORDER — MIDAZOLAM HCL 2 MG/2ML IJ SOLN
1.0000 mg | INTRAMUSCULAR | Status: DC
  Administered 2017-01-11: 2 mg via INTRAVENOUS

## 2017-01-11 MED ORDER — CEFAZOLIN SODIUM-DEXTROSE 1-4 GM/50ML-% IV SOLN: 1.0000 g | Freq: Once | INTRAVENOUS | Status: DC

## 2017-01-11 NOTE — Transfer of Care (Signed)
Immediate Anesthesia Transfer of Care Note  Patient: Elmarie Shiley  Procedure(s) Performed: ESOPHAGOGASTRODUODENOSCOPY (EGD) WITH PROPOFOL (N/A ) PERCUTANEOUS ENDOSCOPIC GASTROSTOMY (PEG) PLACEMENT (N/A )  Patient Location: PACU  Anesthesia Type:MAC  Level of Consciousness: awake  Airway & Oxygen Therapy: Patient Spontanous Breathing and Patient connected to face mask oxygen  Post-op Assessment: Report given to RN and Post -op Vital signs reviewed and stable  Post vital signs: Reviewed and stable  Last Vitals:  Vitals:   01/11/17 1230 01/11/17 1235  BP: (!) 163/110 (!) 156/104  Pulse:    Resp: (!) 23 20  Temp:    SpO2: 97% 98%    Last Pain:  Vitals:   01/11/17 1156  TempSrc: Axillary  PainSc: 0-No pain         Complications: No apparent anesthesia complications

## 2017-01-11 NOTE — Progress Notes (Signed)
No further episodes of SVT  Will be available as needed if arrhythmia returns  Can be treated acutely as needed  OK to procceed with PEG placemente Defer BP management to primary service

## 2017-01-11 NOTE — Progress Notes (Signed)
  Speech Language Pathology Treatment: Dysphagia  Patient Details Name: Steve Koch MRN: [id number] DOB: 03/08/1960 Today's Date: 01/11/2017 Time: [id number] SLP Time Calculation (min) (ACUTE ONLY): 18 min  Assessment / Plan / Recommendation Clinical Impression  Pt had PEG placed earlier today. SLP discussed role of SLP at this time with recommendation to continue NPO with oral care. SLP offered single ice chip x 2 by placing in right buccal cavity after Pt indicated he would like to try. Pt with severe oral deficits with reduced labial and lingual movement. Hyolaryngeal excursion palpated, however followed by coughing with need for oral suction. Recommend continue NPO with consideration of comfort feeds if Pt demonstrates improvement in secretion management. Pt/wife in agreement with plan.    HPI HPI: Steve RanCharles Pattesonis a 56 y.o.malewith ALS, anxiety, GERD who was brought to the emergency department by family members due to fever, hematuria, nausea with vomiting, urinary symptoms. Patient became ill a couple of days ago but resisted coming to the ED. Finally after multiple request by family members he agreed. Pt was diagnosed with ALS in July of 2018 and is followed by Dr. Fayrene FearingJames Koch at the ALS clinic. He was last seen by SLP Steve Koch(Steve Koch) in November and an objective swallow assessment was recommended, but never completed. BSE ordered due to dysphagia and weight loss in setting of ALS.       SLP Plan  Continue with current plan of care       Recommendations  Diet recommendations: NPO Medication Administration: Via alternative means                Plan: Continue with current plan of care       Thank you,  Havery MorosDabney Porter, CCC-SLP [phone]                 PORTER,DABNEY 01/11/2017, 4:50 PM

## 2017-01-11 NOTE — Progress Notes (Signed)
PROGRESS NOTE  Steve Koch  ZOX:[id number]  DOB: 03-18-1960  DOA: 01/07/2017 [facility]: [facility]  Brief Admission Hx: Steve Koch is a 56 y.o. male with medical history significant of recently diagnosed and rapidly progressive ALS, anxiety, right hand carpal tunnel syndrome, GERD, hypertension, postoperative nausea and vomiting who was brought to the emergency department by family members due to fever today associated with hematuria, nausea and one episode of emesis the previous night, urinary frequency and decreased urinary output.  He was diagnosed with severe sepsis, UTI, dehydration and aspiration pneumonia.    MDM/Assessment & Plan:   1. Severe sepsis secondary to E coli UTI - continue IV ceftriaxone but plan to de-escalate to augmentin when he gets PEG placed hopefully later today.  Continue supportive care with IVF hydration.  2. Aspiration pneumonia - added anaerobic coverage with metronidazole, Keep patient NPO as he is high aspiration risk.  The family decided to pursue PEG placement for nutrition.  GI consulted and will place when medically ready. If he doesn't get today will need to place feeding tube and start tube feeds.   3. Oral thrush - wife reports having to clean white patches off tongue and cheeks, will treat with IV fluconazole. 4. ALS - has been rapidly progressive.  Keep NPO for now.  Lorazepam ordered as needed for spasticity. Palliative medicine has seen him.  5. Sinus tachycardia - suspect secondary to sepsis.  Treating supportively. Much improved.  6. SVT - Pt had a couple of recurrent episodes overnight. It responded to carotid massage and IV lopressor. Cardiology has seen him and ordered low dose metoprolol in anticipation of PEG placement.   7. Essential Hypertension - Has not required blood pressure medication, will follow.  He is NPO and will need to be given IV if needed.  8. GERD - protonix IV ordered for GI protection.  9. GAD -  paroxetine on hold while NPO.  Lorazepam ordered as needed.  10. Leukocytosis - WBC trending down.    DVT prophylaxis: lovenox Code Status: DNR Family Communication: wife at bedside Disposition Plan: Home Hospice in 1-2 days  Subjective: Pt alert and communicating. He says that he is hungry and that He is having a difficult time living day to day with ALS.  He asks me to work on a treatment or solution or cure for ALS.    Objective: Vitals:   01/11/17 0200 01/11/17 0300 01/11/17 0400 01/11/17 0500  BP: 125/80 129/88 (!) 141/84 130/90  Pulse: (!) 108 (!) 106 (!) 111 (!) 101  Resp: (!) 36 (!) 24 (!) 23 (!) 23  Temp:   98.3 F (36.8 C)   TempSrc:   Axillary   SpO2: 96% 94% 98% 94%  Weight:      Height:        Intake/Output Summary (Last 24 hours) at 01/11/2017 0735 Last data filed at 01/11/2017 0500 Gross per 24 hour  Intake 2900 ml  Output 800 ml  Net 2100 ml   Filed Weights   01/08/17 0351  Weight: 93.6 kg (206 lb 5.6 oz)   REVIEW OF SYSTEMS  As per history otherwise all reviewed and reported negative  Exam:  General exam: Pt awake, alert, no distress, lying in bed. Flat affect.  Respiratory system: shallow. mild diminished BS LLL. No increased work of breathing. Cardiovascular system: S1 & S2 heard, tachycardic. R. No JVD, murmurs, gallops, clicks or pedal edema. Gastrointestinal system: Abdomen is nondistended, soft and nontender. Normal bowel sounds heard.  Central nervous system: spastic quadriparesis, nonverbal.  Extremities: no CCE.  Data Reviewed: Basic Metabolic Panel: Recent Labs  Lab 01/08/17 0020 01/08/17 0646 01/09/17 0654 01/10/17 0559  NA 141 138 135 138  K 4.4 3.6 3.7 3.5  CL 105 104 104 108  CO2 25 23 19* 17*  GLUCOSE 125* 113* 89 80  BUN 9 7 7 6   CREATININE 0.48* 0.48* 0.49* 0.40*  CALCIUM 9.0 8.4* 8.3* 8.3*  MG 1.9 1.7  --   --   PHOS 2.6 2.5  --   --    Liver Function Tests: Recent Labs  Lab 01/08/17 0020  AST 17  ALT 22    ALKPHOS 77  BILITOT 1.0  PROT 7.4  ALBUMIN 3.8   No results for input(s): LIPASE, AMYLASE in the last 168 hours. No results for input(s): AMMONIA in the last 168 hours. CBC: Recent Labs  Lab 01/08/17 0020 01/08/17 0646 01/09/17 0654 01/10/17 0559 01/11/17 0413  WBC 23.8* 20.8* 25.2* 18.1* 14.5*  NEUTROABS 20.9* 18.7* 21.8* 14.6 10.7*  HGB 14.7 13.9 13.6 13.4 13.7  HCT 41.7 40.1 39.0 37.6* 39.2  MCV 85.6 85.7 85.2 84.5 83.2  PLT 221 185 179 219 275   Cardiac Enzymes: No results for input(s): CKTOTAL, CKMB, CKMBINDEX, TROPONINI in the last 168 hours. CBG (last 3)  No results for input(s): GLUCAP in the last 72 hours. Recent Results (from the past 240 hour(s))  Blood Culture (routine x 2)     Status: None (Preliminary result)   Collection Time: 01/08/17 12:20 AM  Result Value Ref Range Status   Specimen Description RIGHT ANTECUBITAL  Final   Special Requests   Final    BOTTLES DRAWN AEROBIC AND ANAEROBIC Blood Culture adequate volume   Culture NO GROWTH 2 DAYS  Final   Report Status PENDING  Incomplete  Urine culture     Status: Abnormal   Collection Time: 01/08/17 12:41 AM  Result Value Ref Range Status   Specimen Description URINE, CLEAN CATCH  Final   Special Requests NONE  Final   Culture >=100,000 COLONIES/mL ESCHERICHIA COLI (A)  Final   Report Status 01/10/2017 FINAL  Final   Organism ID, Bacteria ESCHERICHIA COLI (A)  Final      Susceptibility   Escherichia coli - MIC*    AMPICILLIN <=2 SENSITIVE Sensitive     CEFAZOLIN <=4 SENSITIVE Sensitive     CEFTRIAXONE <=1 SENSITIVE Sensitive     CIPROFLOXACIN <=0.25 SENSITIVE Sensitive     GENTAMICIN <=1 SENSITIVE Sensitive     IMIPENEM <=0.25 SENSITIVE Sensitive     NITROFURANTOIN <=16 SENSITIVE Sensitive     TRIMETH/SULFA <=20 SENSITIVE Sensitive     AMPICILLIN/SULBACTAM <=2 SENSITIVE Sensitive     PIP/TAZO <=4 SENSITIVE Sensitive     Extended ESBL NEGATIVE Sensitive     * >=100,000 COLONIES/mL ESCHERICHIA COLI   Blood Culture (routine x 2)     Status: None (Preliminary result)   Collection Time: 01/08/17 12:42 AM  Result Value Ref Range Status   Specimen Description BLOOD RIGHT HAND  Final   Special Requests   Final    BOTTLES DRAWN AEROBIC ONLY Blood Culture results may not be optimal due to an inadequate volume of blood received in culture bottles   Culture NO GROWTH 2 DAYS  Final   Report Status PENDING  Incomplete  MRSA PCR Screening     Status: None   Collection Time: 01/08/17  3:07 AM  Result Value Ref Range Status  MRSA by PCR NEGATIVE NEGATIVE Final    Comment:        The GeneXpert MRSA Assay (FDA approved for NASAL specimens only), is one component of a comprehensive MRSA colonization surveillance program. It is not intended to diagnose MRSA infection nor to guide or monitor treatment for MRSA infections.      Studies: Dg Chest Port 1 View  Result Date: 01/10/2017 CLINICAL DATA:  Aspiration pneumonia. EXAM: PORTABLE CHEST 1 VIEW COMPARISON:  01/08/2017 FINDINGS: There is persistent bibasilar atelectasis at least in part due to a shallow inspiration. No significant change. Heart size and vascularity are normal. No significant bone abnormality. IMPRESSION: No change in the appearance of the chest. Bibasilar atelectasis, at least in part due to a shallow inspiration. No lung consolidation. Electronically Signed   By: Francene Boyers M.D.   On: 01/10/2017 07:55   Scheduled Meds: . mouth rinse  15 mL Mouth Rinse BID  . metoprolol tartrate  2.5 mg Intravenous Q12H  . pantoprazole (PROTONIX) IV  40 mg Intravenous Q24H   Continuous Infusions: . sodium chloride 125 mL/hr at 01/11/17 0057  . cefTRIAXone (ROCEPHIN)  IV Stopped (01/11/17 0130)  . fluconazole (DIFLUCAN) IV    . metronidazole Stopped (01/11/17 0350)    Principal Problem:   Sepsis secondary to UTI [facility]) Active Problems:   Essential hypertension   Vitamin B 12 deficiency   Generalized anxiety disorder   GERD  (gastroesophageal reflux disease)   ALS (amyotrophic lateral sclerosis) (HCC)   Sinus tachycardia   Oropharyngeal dysphagia   Loss of weight   Malnutrition of moderate degree   Aspiration into airway   Palliative care encounter   Goals of care, counseling/discussion   Encounter for hospice care discussion  Critical Care Time spent: 32 mins  Standley Dakins, MD, FAAFP [facility] Pager [phone]  If 7PM-7AM, please contact night-coverage [email or web address] Password [email or web address] 01/11/2017, 7:35 AM    LOS: 3 days

## 2017-01-11 NOTE — Progress Notes (Addendum)
01/11/2017 5:45pm  Pt developed acute respiratory distress shortly after arriving back to the floor from his pEG placement procedure.  The patient was not strong enough to pull air into his lungs.  He started to panic and sweat profusely.  His HR and blood pressure because extremely elevated.  I was concerned that there was some type of obstruction.  The RT and RN attempted deep suctioning, ordered STAT chest xray to rule out acute tension pneumothorax, started NRB and then placed on bipap with good improvement in respiratory status.  His blood pressure improved.  His HR came back down and patient relaxed.  Family was present in room.  I confirmed Code status with wife and patient will remain DNR/DNI.  Hopefully we can rapidly get him weaned off bipap.  The patient definitely did not want to be intubated.  Also, I asked the pharmacist to start the fluid challenge to his PEG tube.  If he tolerates this for 4 hours, then will start tube feeds overnight to help get him stronger.  If the strength in his diaphragm does not improve, I worry that we will need to address chronic/longterm respiratory support with family.  I ordered xopenex neb treatments and chest physiotherapy.  Family updated and questions answered at bedside.    Maryln Manuel. Fritz Cauthon MD

## 2017-01-11 NOTE — Progress Notes (Signed)
Pt on 2L [place] having difficulty breathing, diaphoretic, desaturated to 87% BP 224/121. Respiratory and Dr. Laural BenesJohnson paged. Pt placed on non-rebreather 15 L. Pt suctioned. Stat chest xray ordered. CXR showing mucus plug. Toniann FailWendy Via, RRT suctioned. Placed on BiPAP and Levabuterol given.  Current VS BP103/74 HR103 SpO2 99%  RR 22

## 2017-01-11 NOTE — Anesthesia Preprocedure Evaluation (Addendum)
Anesthesia Evaluation  Patient identified by MRN, date of birth, ID band Patient awake    Reviewed: Allergy & Precautions, NPO status , Patient's Chart, lab work & pertinent test results  History of Anesthesia Complications (+) PONV and history of anesthetic complications  Airway Mallampati: II       Dental no notable dental hx. (+) Caps, Dental Advisory Given, Teeth Intact,    Pulmonary former smoker,    Pulmonary exam normal breath sounds clear to auscultation       Cardiovascular hypertension, Pt. 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   Abdominal (+) + obese,   Peds  Hematology negative hematology ROS (+)   Anesthesia Other Findings   Reproductive/Obstetrics                            Anesthesia Physical Anesthesia Plan  ASA: IV  Anesthesia Plan: MAC   Post-op Pain Management:    Induction: Intravenous  PONV Risk Score and Plan:   Airway Management Planned: Simple Face Mask  Additional Equipment:   Intra-op Plan:   Post-operative Plan:   Informed Consent: I have reviewed the patients History and Physical, chart, labs and discussed the procedure including the risks, benefits and alternatives for the proposed anesthesia with the patient or authorized representative who has indicated his/her understanding and acceptance.     Plan Discussed with:   Anesthesia Plan Comments:         Anesthesia Quick Evaluation

## 2017-01-11 NOTE — Progress Notes (Signed)
Daily Progress Note   Patient Name: Steve Koch       Date: 01/11/2017 DOB: 25-Dec-1960  Age: 56 y.o. MRN#: [id number] Attending Physician: Cleora Fleet, MD Primary Care Physician: [facility] Admit Date: 01/07/2017  Reason for Consultation/Follow-up: Establishing goals of care and Psychosocial/spiritual support  Subjective: Mr. Steve Koch has just received his PEG tube.  His wife is at bedside, we talked about tube feeding, managing at home.  She asks about providing potato soup via PEG.  I share that he would not need additional nutritional support other than the tube feeding that should be provided.  We talked about pleasure feeding, the idea of the taste of food in the mouth, not feeding for nutritional value.  I share with Ms. Steve Koch that she has done an excellent job in caring for her husband, providing for his needs, especially with the fast progression of his ALS.  She shares stories of how she has adjusted the home for his changing needs, including adding extended grab bars near the toilet, and at the couch. I share that our case management will work with her about needs at home, faxing information [facility].  Length of Stay: 3  Current Medications: Scheduled Meds:  . mouth rinse  15 mL Mouth Rinse BID  . metoprolol tartrate  2.5 mg Intravenous Q12H  . pantoprazole (PROTONIX) IV  40 mg Intravenous Q24H    Continuous Infusions: . sodium chloride 125 mL/hr at 01/11/17 1430  . cefTRIAXone (ROCEPHIN)  IV Stopped (01/11/17 0130)  . metronidazole Stopped (01/11/17 1005)    PRN Meds: acetaminophen, ketorolac, LORazepam, metoprolol tartrate, phenol, sodium chloride, zolpidem  Physical Exam  Constitutional: No distress.  HENT:  Head:  Atraumatic.  Cardiovascular: Normal rate.  Pulmonary/Chest: Effort normal. No respiratory distress.  Abdominal: Soft. He exhibits no distension.  New PEG tube site  Musculoskeletal: He exhibits no edema.  Skin: Skin is warm and dry.  Nursing note and vitals reviewed.           Vital Signs: BP (!) 143/80   Pulse (!) 109   Temp 98.9 F (37.2 C)   Resp 18   Ht 6\' 1"  (1.854 m)   Wt 93.6 kg (206 lb 5.6 oz)   SpO2 97%   BMI 27.22 kg/m  SpO2: SpO2: 97 %  O2 Device: O2 Device: NRB O2 Flow Rate: O2 Flow Rate (L/min): 6 L/min  Intake/output summary:   Intake/Output Summary (Last 24 hours) at 01/11/2017 1507 Last data filed at 01/11/2017 1452 Gross per 24 hour  Intake 3262.5 ml  Output 1650 ml  Net 1612.5 ml   LBM: Last BM Date: 01/11/17 Baseline Weight: Weight: 93.6 kg (206 lb 5.6 oz) Most recent weight: Weight: 93.6 kg (206 lb 5.6 oz)       Palliative Assessment/Data:    Flowsheet Rows     Most Recent Value  Intake Tab  Referral Department  Hospitalist  Unit at Time of Referral  ICU  Palliative Care Primary Diagnosis  Sepsis/Infectious Disease  Date Notified  01/10/17  Palliative Care Type  New Palliative care  Reason for referral  Clarify Goals of Care, Psychosocial or Spiritual support  Date of Admission  01/07/17  Date first seen by Palliative Care  01/10/17  # of days Palliative referral response time  0 Day(s)  # of days IP prior to Palliative referral  3  Clinical Assessment  Palliative Performance Scale Score  30%  Pain Max last 24 hours  Not able to report  Pain Min Last 24 hours  Not able to report  Dyspnea Max Last 24 Hours  Not able to report  Dyspnea Min Last 24 hours  Not able to report  Psychosocial & Spiritual Assessment  Palliative Care Outcomes  Patient/Family meeting held?  Yes  Who was at the meeting?  Patient and wife at bedside.  Palliative Care Outcomes  Counseled regarding hospice, Provided advance care planning, Clarified goals of care,  Provided psychosocial or spiritual support  Patient/Family wishes: Interventions discontinued/not started   Mechanical Ventilation      Patient Active Problem List   Diagnosis Date Noted  . Aspiration into airway   . Palliative care encounter   . Goals of care, counseling/discussion   . Encounter for hospice care discussion   . Sepsis secondary to UTI (HCC) 01/08/2017  . GERD (gastroesophageal reflux disease) 01/08/2017  . ALS (amyotrophic lateral sclerosis) (HCC) 01/08/2017  . Sinus tachycardia 01/08/2017  . Malnutrition of moderate degree 01/08/2017  . Oropharyngeal dysphagia   . Loss of weight   . Abnormality of gait and mobility   . Dysarthria   . Benign essential HTN   . Spastic quadriparesis (HCC)   . Vitamin B 12 deficiency   . Generalized anxiety disorder   . Hypoalbuminemia due to protein-calorie malnutrition (HCC)   . Atrophy of muscle of both hands   . Fall 06/21/2016  . Cervical myelopathy (HCC) 06/21/2016  . Essential hypertension 06/21/2016  . Sensory disturbance 06/21/2016  . Gait instability 06/21/2016  . Hyperreflexia   . Poor tolerance for ambulation   . Myelopathy (HCC) 03/09/2016    Palliative Care Assessment & Plan   Patient Profile: 56 y.o. male  with past medical history of ALS with fast progression diagnosed July 2018, falls related to ALS, high blood pressure, GERD, carpal tunnel syndrome, cervical disc repair February 2018 admitted on 01/07/2017 with sepsis secondary to UTI.   Assessment: sepsis secondary to UTI: Improving with antibiotics and fluids. ALS; fast progressing, essentially bedbound at this point, PEG tube placed at this admission, DNR CODE STATUS confirmed.  Recommendations/Plan:  Home with [facility], [facility]  Goals of Care and Additional Recommendations:  Limitations on Scope of Treatment: Treat the treatable, no CPR, no intubation.  Code Status:    Code Status Orders  (  From admission, onward)          Start     Ordered   01/08/17 0340  Do not attempt resuscitation (DNR)  Continuous    Question Answer Comment  In the event of cardiac or respiratory ARREST Do not call a "code blue"   In the event of cardiac or respiratory ARREST Do not perform Intubation, CPR, defibrillation or ACLS   In the event of cardiac or respiratory ARREST Use medication by any route, position, wound care, and other measures to relive pain and suffering. May use oxygen, suction and manual treatment of airway obstruction as needed for comfort.      01/08/17 [id number]    Code Status History    Date Active Date Inactive Code Status Order ID Comments User Context   01/08/2017 03:06 01/08/2017 03:39 Full Code [id number]  Bobette MoOrtiz, David Manuel, MD Inpatient   06/23/2016 18:14 07/15/2016 13:06 Full Code [id number]  Jerene PitchLove, Pamela S, PA-C Inpatient   06/23/2016 18:14 06/23/2016 18:14 Full Code [id number]  Jacquelynn CreeLove, Pamela S, PA-C Inpatient   06/21/2016 03:15 06/23/2016 17:40 Full Code [id number]  Meredeth IdeLama, Gagan S, MD Inpatient   03/09/2016 19:04 03/10/2016 17:55 Full Code [id number]  Venita LickBrooks, Dahari, MD Inpatient    Advance Directive Documentation     Most Recent Value  Type of Advance Directive  Healthcare Power of Attorney  Pre-existing out of facility DNR order (yellow form or pink MOST form)  No data  "MOST" Form in Place?  No data       Prognosis:   < 6 months, or less, would not be surprising based on fast progression of ALS leading to basically bedbound/wheelchair bound status, UTI with sepsis, risk for aspiration.   Discharge Planning:  [facility], [facility].  Care plan was discussed with nursing staff, and Dr. Laural BenesJohnson.  Thank you for allowing the Palliative Medicine Team to assist in the care of this patient.   Time In:  1355 Time Out:  1415 Total Time  20 minutes Prolonged Time Billed  no       Greater than 50%  of this time was spent counseling and coordinating care  related to the above assessment and plan.  Katheran Aweasha A Charnetta Wulff, NP  Please contact Palliative Medicine Team phone at [phone] for questions and concerns.

## 2017-01-11 NOTE — Progress Notes (Signed)
Patient briefly seen to assess stability for PEG placement today. VSS. Lungs with diminished breath sounds but clear. Abdomen soft. Patient reminded me he gets sick with anesthesia and requests antiemetic perioperatively.   Lab Results  Component Value Date   WBC 14.5 (H) 01/11/2017   HGB 13.7 01/11/2017   HCT 39.2 01/11/2017   MCV 83.2 01/11/2017   PLT 275 01/11/2017    Leanna BattlesLeslie S. Dixon BoosLewis, PA-C [facility] [phone] 12/27/20188:38 AM

## 2017-01-11 NOTE — Anesthesia Postprocedure Evaluation (Signed)
Anesthesia Post Note  Patient: Steve Koch  Procedure(s) Performed: ESOPHAGOGASTRODUODENOSCOPY (EGD) WITH PROPOFOL (N/A ) PERCUTANEOUS ENDOSCOPIC GASTROSTOMY (PEG) PLACEMENT (N/A )  Patient location during evaluation: PACU Anesthesia Type: MAC Level of consciousness: awake Pain management: pain level controlled Vital Signs Assessment: post-procedure vital signs reviewed and stable Respiratory status: spontaneous breathing, nonlabored ventilation and respiratory function stable Cardiovascular status: blood pressure returned to baseline Postop Assessment: no apparent nausea or vomiting Anesthetic complications: no     Last Vitals:  Vitals:   01/11/17 1325 01/11/17 1330  BP: (!) 152/92 (!) 143/80  Pulse: (!) 114 (!) 109  Resp: 18 18  Temp:    SpO2: 97% 97%    Last Pain:  Vitals:   01/11/17 1330  TempSrc:   PainSc: 3                  Crystle Carelli J

## 2017-01-11 NOTE — Op Note (Addendum)
[facility] Patient Name: Steve Koch Procedure Date: 01/11/2017 12:12 PM MRN: [id number] Date of Birth: [birth year] Attending MD: Gennette Pacobert Michael Marvetta Vohs , MD CSN: [id number] Age: 56 Admit Type: Inpatient Procedure:                Upper GI endoscopy with PEG placement Indications:              Dysphagia Providers:                Gennette Pacobert Michael Duffy Dantonio, MD, Nena PolioLisa Moore, RN, Edythe ClarityKelly                            Cox, Technician, Burke Keelsrisann Tilley, Technician, Cliffton AstersPam                            Shreve, RN Referring MD:              Medicines:                Propofol per Anesthesia, Ancef 1 g IV. Complications:            No immediate complications. Estimated Blood Loss:     Estimated blood loss was minimal. Procedure:                Pre-Anesthesia Assessment:                           - Prior to the procedure, a History and Physical                            was performed, and patient medications and                            allergies were reviewed. The patient's tolerance of                            previous anesthesia was also reviewed. The risks                            and benefits of the procedure and the sedation                            options and risks were discussed with the patient.                            All questions were answered, and informed consent                            was obtained. Prior Anticoagulants: The patient has                            taken no previous anticoagulant or antiplatelet                            agents. ASA Grade Assessment: III - A patient with  severe systemic disease. After reviewing the risks                            and benefits, the patient was deemed in                            satisfactory condition to undergo the procedure.                           After obtaining informed consent, the endoscope was                            passed under direct vision. Throughout the   procedure, the patient's blood pressure, pulse, and                            oxygen saturations were monitored continuously. The                            [id number] ([id number]) scope was introduced through the                            mouth, and advanced to the second part of duodenum.                            The upper GI endoscopy was accomplished without                            difficulty. The patient tolerated the procedure                            well. Scope In: 12:44:18 PM Scope Out: 12:57:48 PM Total Procedure Duration: 0 hours 13 minutes 30 seconds  Findings:      The examined esophagus was normal.      The entire examined stomach was normal aside from multiple       benign-appearing 3-5 mm polyps - one slightly hemorrhagic in appearance.      The duodenal bulb and second portion of the duodenum were normal. A       suitable site along the anterior gastric wall was selected for PEG       placement using external palpation and visualizing excellent       transillumination of light. Externally, the site corresponding to the       medial left upper quadrant. Overlying skin was prepped in sterile       fashion. Local infiltrative anesthesia was produced with 4.5 mL of 1%       plain Xylocaine. Using a Microvasive trocar needle, the abdominal wall       was traversed, the gastric cavity was accessed. A guidewire was advanced       through the trocar and grasped with the snare through the scope and       pulled out of the patient's mouth. Over which, a 20 [place] plastic       internal bumper Microvasive PEG tube was advanced. It was secured at the       3 cm mark. A look back  in the stomach revealed the internal PEG bumper       to be in excellent position. Impression:               - Normal esophagus.                           - Normal stomach aside from Gastric polyps - not                            manipulated. Status post 47 French PEG tube                             placement                           - Normal duodenal bulb and second portion of the                            duodenum. .                           - No specimens collected. Moderate Sedation:      Moderate (conscious) sedation was personally administered by an       anesthesia professional. The following parameters were monitored: oxygen       saturation, heart rate, blood pressure, respiratory rate, EKG, adequacy       of pulmonary ventilation, and response to care. Total physician       intraservice time was 15 minutes. Recommendation:           - Return patient to ICU for ongoing care.                           - Water per PEG tube at 40 mL an hour x 4 hours. If                            doing well in 4 hours, may begin TF of choice. Keep                            head of bed elevated 30 degrees. Continue once                            daily PPI indefinitely. May resume Lovenox on 12/29                            if continued prophylaxis with anticoagulant needed.                           - Continue present medications. Procedure Code(s):        --- Professional ---                           [phone], Esophagogastroduodenoscopy, flexible,                            transoral; diagnostic, including  collection of                            specimen(s) by brushing or washing, when performed                            (separate procedure) Diagnosis Code(s):        --- Professional ---                           R13.10, Dysphagia, unspecified CPT copyright 2016 American Medical Association. All rights reserved. The codes documented in this report are preliminary and upon coder review may  be revised to meet current compliance requirements. Steve Friendsobert M. Sebastain Fishbaugh, MD [name], MD 01/11/2017 1:58:13 PM This report has been signed electronically. Number of Addenda: 0

## 2017-01-11 NOTE — Progress Notes (Signed)
Patients heart rate went from the 80s to 200. Gave patient 5mg  PRN Lopressor and heart rate came back down in the 60s 5 minutes after giving the lopressor. Patient was asymptomatic. Will monitor closely.

## 2017-01-11 NOTE — Progress Notes (Signed)
Pt continuously needing to be suctioned, lungs sounding rhoncus. Dr. Laural BenesJohnson paged.

## 2017-01-11 NOTE — Progress Notes (Signed)
Off unit for PEG Tube placement

## 2017-01-12 ENCOUNTER — Encounter (HOSPITAL_COMMUNITY): Payer: Self-pay | Admitting: Internal Medicine

## 2017-01-12 DIAGNOSIS — R319 Hematuria, unspecified: Secondary | ICD-10-CM

## 2017-01-12 DIAGNOSIS — K219 Gastro-esophageal reflux disease without esophagitis: Secondary | ICD-10-CM

## 2017-01-12 DIAGNOSIS — E440 Moderate protein-calorie malnutrition: Secondary | ICD-10-CM

## 2017-01-12 DIAGNOSIS — R634 Abnormal weight loss: Secondary | ICD-10-CM

## 2017-01-12 DIAGNOSIS — F411 Generalized anxiety disorder: Secondary | ICD-10-CM

## 2017-01-12 DIAGNOSIS — G1221 Amyotrophic lateral sclerosis: Secondary | ICD-10-CM

## 2017-01-12 DIAGNOSIS — R1312 Dysphagia, oropharyngeal phase: Secondary | ICD-10-CM

## 2017-01-12 DIAGNOSIS — R0689 Other abnormalities of breathing: Secondary | ICD-10-CM

## 2017-01-12 DIAGNOSIS — N390 Urinary tract infection, site not specified: Secondary | ICD-10-CM

## 2017-01-12 LAB — CBC WITH DIFFERENTIAL/PLATELET
BASOS PCT: 1 %
Basophils Absolute: 0.1 10*3/uL (ref 0.0–0.1)
EOS PCT: 2 %
Eosinophils Absolute: 0.3 10*3/uL (ref 0.0–0.7)
HEMATOCRIT: 40.9 % (ref 39.0–52.0)
Hemoglobin: 14.7 g/dL (ref 13.0–17.0)
LYMPHS ABS: 2.1 10*3/uL (ref 0.7–4.0)
Lymphocytes Relative: 17 %
MCH: 29.7 pg (ref 26.0–34.0)
MCHC: 35.9 g/dL (ref 30.0–36.0)
MCV: 82.6 fL (ref 78.0–100.0)
MONOS PCT: 8 %
Monocytes Absolute: 1 10*3/uL (ref 0.1–1.0)
Neutro Abs: 9.1 10*3/uL — ABNORMAL HIGH (ref 1.7–7.7)
Neutrophils Relative %: 72 %
Platelets: 312 10*3/uL (ref 150–400)
RBC: 4.95 MIL/uL (ref 4.22–5.81)
RDW: 14.7 % (ref 11.5–15.5)
WBC: 12.6 10*3/uL — ABNORMAL HIGH (ref 4.0–10.5)

## 2017-01-12 MED ORDER — JEVITY 1.2 CAL PO LIQD
1000.0000 mL | ORAL | Status: DC
Start: 2017-01-12 — End: 2017-01-13
  Administered 2017-01-12: 1000 mL
  Filled 2017-01-12: qty 237
  Filled 2017-01-12: qty 1000

## 2017-01-12 MED ORDER — BACLOFEN 10 MG PO TABS
ORAL_TABLET | ORAL | Status: AC
  Filled 2017-01-12: qty 1

## 2017-01-12 MED ORDER — BACLOFEN 10 MG PO TABS
10.0000 mg | ORAL_TABLET | Freq: Every day | ORAL | Status: DC
  Administered 2017-01-12: 10 mg
  Filled 2017-01-12 (×3): qty 1

## 2017-01-12 NOTE — Anesthesia Postprocedure Evaluation (Signed)
Anesthesia Post Note  Patient: Steve Koch  Procedure(s) Performed: ESOPHAGOGASTRODUODENOSCOPY (EGD) WITH PROPOFOL (N/Koch ) PERCUTANEOUS ENDOSCOPIC GASTROSTOMY (PEG) PLACEMENT (N/Koch )  Patient location during evaluation: ICU Anesthesia Type: MAC Level of consciousness: awake and alert, oriented and patient cooperative Pain management: pain level controlled Vital Signs Assessment: post-procedure vital signs reviewed and stable Respiratory status: spontaneous breathing and patient connected to nasal cannula oxygen Cardiovascular status: stable Postop Assessment: no apparent nausea or vomiting Anesthetic complications: no     Last Vitals:  Vitals:   01/12/17 1200 01/12/17 1201  BP: (!) 137/97   Pulse: 82   Resp: (!) 23   Temp:  37.2 C  SpO2: 97% 98%    Last Pain:  Vitals:   01/12/17 1201  TempSrc: Axillary  PainSc:                  Steve Koch

## 2017-01-12 NOTE — Addendum Note (Signed)
Addendum  created 01/12/17 1252 by Earleen NewportAdams, Jozsef Wescoat A, CRNA   Sign clinical note

## 2017-01-12 NOTE — Progress Notes (Signed)
Subjective: Not able to verbalize. Has TobiiDynavox device for communication.  However, he also moves his eyebrows up and down versus I decide to answer yes/no questions.  Wife at bedside and assists.  Indicates she is doing well, no problems with PEG tube or tube feedings.  Denies abdominal pain.  Objective: Vital signs in last 24 hours: Temp:  [97.3 F (36.3 C)-98.9 F (37.2 C)] 97.3 F (36.3 C) (12/28 0700) Pulse Rate:  [73-196] 97 (12/28 0900) Resp:  [18-30] 20 (12/28 0900) BP: (95-185)/(71-119) 119/85 (12/28 0600) SpO2:  [96 %-100 %] 99 % (12/28 0900) FiO2 (%):  [40 %-60 %] 40 % (12/28 0900) Weight:  [211 lb 3.2 oz (95.8 kg)] 211 lb 3.2 oz (95.8 kg) (12/28 0400) Last BM Date: 01/11/17 General:   Alert Eyes:  No icterus, sclera clear. Conjuctiva pink.  Heart:  S1, S2 present, no murmurs noted.  Lungs: Clear to auscultation bilaterally, without wheezing, rales, or rhonchi.  Abdomen:  Bowel sounds present, soft, non-tender, non-distended. No HSM or hernias noted. No rebound or guarding. No masses appreciated. PEG tub noted upper abdomen with dressing clean, dry, intact and abdominal binder in place.  Msk:  Symmetrical without gross deformities. Pulses:  Normal bilateral DP pulses noted. Extremities:  Without clubbing or edema. Neurologic:  Alert. Gross neurological deficits related to progression of ALS. Skin:  Warm and dry, intact without significant lesions.   Intake/Output from previous day: 12/27 0701 - 12/28 0700 In: 1461.6 [I.V.:737.5; NG/GT:74.1; IV Piggyback:650] Out: 2350 [Urine:2350] Intake/Output this shift: No intake/output data recorded.  Lab Results: Recent Labs    01/10/17 0559 01/11/17 0413 01/12/17 0541  WBC 18.1* 14.5* 12.6*  HGB 13.4 13.7 14.7  HCT 37.6* 39.2 40.9  PLT 219 275 312   BMET Recent Labs    01/10/17 0559  NA 138  K 3.5  CL 108  CO2 17*  GLUCOSE 80  BUN 6  CREATININE 0.40*  CALCIUM 8.3*   LFT No results for input(s):  PROT, ALBUMIN, AST, ALT, ALKPHOS, BILITOT, BILIDIR, IBILI in the last 72 hours. PT/INR No results for input(s): LABPROT, INR in the last 72 hours. Hepatitis Panel No results for input(s): HEPBSAG, HCVAB, HEPAIGM, HEPBIGM in the last 72 hours.   Studies/Results: Dg Chest Port 1 View  Result Date: 01/11/2017 CLINICAL DATA:  Shortness of Breath EXAM: PORTABLE CHEST 1 VIEW COMPARISON:  January 10, 2017 in January 08, 2017 FINDINGS: There is atelectatic change in the left base. Lungs elsewhere are clear. Heart is mildly enlarged with pulmonary vascularity within normal limits. No adenopathy. There is postoperative change in the lower cervical spine. IMPRESSION: Left base atelectasis. Lungs elsewhere clear. Stable cardiac silhouette. Electronically Signed   By: Bretta BangWilliam  Woodruff III M.D.   On: 01/11/2017 17:55    Assessment: [age] year old unfortunate gentleman with ALS with fairly rapid progression of the past 6 months.  40 pound weight loss.  Patient has been seen by speech this admission.  Patient at risk of aspiration and unable to adequately protect his airway. They recommend alternative means for nutrition for long-term use.  Consideration of comfort feeds of D1/pure and thin versus nectar thickened liquids fed via spouse only.  PEG placement would be beneficial in the setting of ALS for optimal nutrition, decrease risk of food aspiration, avenue from medication delivery.  Discussed at length with patient and spouse, PEG placement will not eliminate risk of aspiration even from saliva or through the means of regurgitation.  Peg placement will not prolong  life or change outcome.  They both voiced understanding and patient through use of vobi acknowledged that he would like to pursue PEG placement this admission.    PEG tube placement was held for a couple days due to tachycardia and urosepsis.  EGD with PEG tube placement was completed 01/11/2017 which found normal esophagus, normal stomach besides  gastric polyps of her not manipulated.  A 20 French PEG tube was placed.  Normal duodenum.  Recommended 40 mL's of water an hour times 4 hours and if no adverse effects may begin tube feeds.  CBC completed today which found improvement in white blood cell count of 12.6, stable/normal hemoglobin of 14.7, normal platelets at 312.  Tube feedings were initiated last night.  Speech language pathology saw the patient yesterday and attempted to give him ice chips x2 with noted severe oral deficits and reduced labial and lingual movement followed by coughing and need for oral suction.  Recommended continue n.p.o. and consideration of comfort feeds if patient demonstrates improvement and secretion management.  Again recommended n.p.o. and medication administration via alternative means.  PEG site looks good.  No residuals as of yet, nurse about the check residuals.  Denies abdominal pain or overt symptoms.  Continue PEG tube feedings under direction of dietitian.  They are attempting placement of patient to hospice.  Plan: 1. Continue tube feeds as long as no significant residuals 2. Defer to dietitian for formula selection 3. Remain n.p.o. at this time 4. Monitor PEG site 5. Monitor for any GI bleeding or decline in hemoglobin 6. Supportive measures  **PLEASE NOTE: There will not be GI coverage from 5 pm 01/12/17 through 7 am 01/15/17*   Thank you for allowing us to participate in the care of [name]  Wynne DustEric Gill, DNP, AGNP-C Adult & Gerontological Nurse Practitioner [facility]     LOS: 4 days    01/12/2017, 9:07 AM

## 2017-01-12 NOTE — Progress Notes (Signed)
Daily Progress Note   Patient Name: Steve Koch       Date: 01/12/2017 DOB: May 02, 1960  Age: 56 y.o. MRN#: [id number] Attending Physician: Kathlen ModyAkula, Vijaya, MD Primary Care Physician: [facility], [facility] Admit Date: 01/07/2017  Reason for Consultation/Follow-up: Establishing goals of care, Hospice Evaluation and Psychosocial/spiritual support  Subjective: Steve Koch is resting quietly in bed.  His bed is in the chair position.  He looks more comfortable, alert than yesterday.  His wife, Alvis LemmingsDawn, is at bedside.  We talked about disposition.  We talked about comfort feedings.  Dawn shares her concern over what she perceived to be rough treatment and moving Steve Koch yesterday and also tube feeding water drawn from the sink faucet.  I reassure her that we can absolutely change the tube feeding bag if that is what she requests.  Dawn seems satisfied with our answers.   Dawn shares that Steve Koch was upset with her this morning because she went home to rest.  She shares that she was so tired that she was walking like a drunk.  Steve Koch uses his communication board to state that he was not mad, that he woke up and was confused.  We discussed that sometimes fear looks like anger.   Steve Koch are supported emotionally through this difficult time, giving praise to Mrs. Pattison from myself and nursing staff over her excellent care of her husband.  Conference with case management related to disposition with [facility], [facility] [place].  Update provided to hospitalist.  Length of Stay: 4  Current Medications: Scheduled Meds:  . feeding supplement (JEVITY 1.2 CAL)  1,000 mL Per Tube Q24H  . ipratropium-albuterol  3 mL Nebulization Q4H  . mouth rinse  15 mL  Mouth Rinse BID  . metoprolol tartrate  2.5 mg Intravenous Q12H  . pantoprazole (PROTONIX) IV  40 mg Intravenous Q24H    Continuous Infusions: . cefTRIAXone (ROCEPHIN)  IV Stopped (01/12/17 0108)  . metronidazole Stopped (01/12/17 1147)    PRN Meds: acetaminophen, LORazepam, metoprolol tartrate, phenol, sodium chloride, zolpidem  Physical Exam  Constitutional: No distress.  Appears chronically ill, makes and briefly keeps eye contact  HENT:  Head: Normocephalic and atraumatic.  Cardiovascular: Normal rate and regular rhythm.  Pulmonary/Chest: Effort normal. No respiratory distress.  Abdominal: Soft. He exhibits  no distension.  New PEG site LUQ  Musculoskeletal:  Bilateral foot drop  Neurological: He is alert.  Nonverbal  Skin: Skin is warm and dry.  Nursing note and vitals reviewed.           Vital Signs: BP 119/85   Pulse 97   Temp (!) 97.3 F (36.3 C) (Axillary)   Resp 20   Ht 6\' 1"  (1.854 m)   Wt 95.8 kg (211 lb 3.2 oz)   SpO2 98%   BMI 27.86 kg/m  SpO2: SpO2: 98 % O2 Device: O2 Device: Nasal Cannula O2 Flow Rate: O2 Flow Rate (L/min): 3 L/min  Intake/output summary:   Intake/Output Summary (Last 24 hours) at 01/12/2017 1221 Last data filed at 01/12/2017 0208 Gross per 24 hour  Intake 1024.08 ml  Output 1500 ml  Net -475.92 ml   LBM: Last BM Date: 01/11/17 Baseline Weight: Weight: 93.6 kg (206 lb 5.6 oz) Most recent weight: Weight: 95.8 kg (211 lb 3.2 oz)       Palliative Assessment/Data:    Flowsheet Rows     Most Recent Value  Intake Tab  Referral Department  Hospitalist  Unit at Time of Referral  ICU  Palliative Care Primary Diagnosis  Sepsis/Infectious Disease  Date Notified  01/10/17  Palliative Care Type  New Palliative care  Reason for referral  Clarify Goals of Care, Psychosocial or Spiritual support  Date of Admission  01/07/17  Date first seen by Palliative Care  01/10/17  # of days Palliative referral response time  0 Day(s)  #  of days IP prior to Palliative referral  3  Clinical Assessment  Palliative Performance Scale Score  30%  Pain Max last 24 hours  Not able to report  Pain Min Last 24 hours  Not able to report  Dyspnea Max Last 24 Hours  Not able to report  Dyspnea Min Last 24 hours  Not able to report  Psychosocial & Spiritual Assessment  Palliative Care Outcomes  Patient/Family meeting held?  Yes  Who was at the meeting?  Patient and wife at bedside.  Palliative Care Outcomes  Counseled regarding hospice, Provided advance care planning, Clarified goals of care, Provided psychosocial or spiritual support  Patient/Family wishes: Interventions discontinued/not started   Mechanical Ventilation      Patient Active Problem List   Diagnosis Date Noted  . Breathing difficult   . Urinary tract infection with hematuria   . Aspiration into airway   . Palliative care encounter   . Goals of care, counseling/discussion   . Encounter for hospice care discussion   . Sepsis secondary to UTI (HCC) 01/08/2017  . GERD (gastroesophageal reflux disease) 01/08/2017  . ALS (amyotrophic lateral sclerosis) (HCC) 01/08/2017  . Sinus tachycardia 01/08/2017  . Malnutrition of moderate degree 01/08/2017  . Oropharyngeal dysphagia   . Loss of weight   . Abnormality of gait and mobility   . Dysarthria   . Benign essential HTN   . Spastic quadriparesis (HCC)   . Vitamin B 12 deficiency   . Generalized anxiety disorder   . Hypoalbuminemia due to protein-calorie malnutrition (HCC)   . Atrophy of muscle of both hands   . Fall 06/21/2016  . Cervical myelopathy (HCC) 06/21/2016  . Essential hypertension 06/21/2016  . Sensory disturbance 06/21/2016  . Gait instability 06/21/2016  . Hyperreflexia   . Poor tolerance for ambulation   . Myelopathy (HCC) 03/09/2016    Palliative Care Assessment & Plan   Patient Profile: 56 y.o.malewith  past medical history of ALS with fast progression diagnosed July 2018, falls related  to ALS, high blood pressure, GERD, carpal tunnel syndrome, cervical disc repair February 2018admitted on 12/23/2018with sepsis secondary to UTI.   Assessment: sepsis secondary to UTI: Improving with antibiotics and fluids.  Continue to look improved.  ALS; fast progressing, essentially bedbound at this point, PEG tube placed at this admission, DNR CODE STATUS confirmed.  Some respiratory decompensation after PEG tube placement yesterday, much improved this a.m.  Recommendations/Plan:  [facility], [place] [facility] [place], likely today.  Goals of Care and Additional Recommendations:  Limitations on Scope of Treatment: Continue to treat the treatable but no CPR, no intubation.  [facility], [facility]  Code Status:    Code Status Orders  (From admission, onward)        Start     Ordered   01/08/17 0340  Do not attempt resuscitation (DNR)  Continuous    Question Answer Comment  In the event of cardiac or respiratory ARREST Do not call a "code blue"   In the event of cardiac or respiratory ARREST Do not perform Intubation, CPR, defibrillation or ACLS   In the event of cardiac or respiratory ARREST Use medication by any route, position, wound care, and other measures to relive pain and suffering. May use oxygen, suction and manual treatment of airway obstruction as needed for comfort.      01/08/17 [id number]    Code Status History    Date Active Date Inactive Code Status Order ID Comments User Context   01/08/2017 03:06 01/08/2017 03:39 Full Code [id number]  Bobette MoOrtiz, David Manuel, MD Inpatient   06/23/2016 18:14 07/15/2016 13:06 Full Code [id number]  Jerene PitchLove, Pamela S, PA-C Inpatient   06/23/2016 18:14 06/23/2016 18:14 Full Code [id number]  Jacquelynn CreeLove, Pamela S, PA-C Inpatient   06/21/2016 03:15 06/23/2016 17:40 Full Code [id number]  Meredeth IdeLama, Gagan S, MD Inpatient   03/09/2016 19:04 03/10/2016 17:55 Full Code [id number]  Venita LickBrooks, Dahari, MD Inpatient    Advance Directive  Documentation     Most Recent Value  Type of Advance Directive  Healthcare Power of Attorney  Pre-existing out of facility DNR order (yellow form or pink MOST form)  No data  "MOST" Form in Place?  No data       Prognosis:   < 6 months (possibly 3 months) or less, would not be surprising based on fast progression of ALS leading to basically bedbound/wheelchair bound status, UTI with sepsis, risk for aspiration.  Discharge Planning:  [facility], [facility]  Care plan was discussed with nursing staff, case management, hospitalist.   Thank you for allowing the Palliative Medicine Team to assist in the care of this patient.   Time In:  1020 Time Out:  1045 Total Time  25 minutes Prolonged Time Billed  no       Greater than 50%  of this time was spent counseling and coordinating care related to the above assessment and plan.  Katheran Aweasha A Dove, NP  Please contact Palliative Medicine Team phone at [phone] for questions and concerns.

## 2017-01-12 NOTE — Progress Notes (Signed)
PROGRESS NOTE    Steve Koch  ZOX:[id number] DOB: 1960/07/24 DOA: 01/07/2017 PCP: [facility]    Brief Narrative:  Steve Koch a 56 y.o.malewith medical history significant of recently diagnosed and rapidly progressive ALS, anxiety, right hand carpal tunnel syndrome, GERD, hypertension, postoperative nausea and vomiting who was brought to the emergency department by family members due to fever today associated with hematuria, nausea and one episode of emesis the previous night, urinary frequency and decreased urinary output.  He was diagnosed with severe sepsis, UTI, dehydration and aspiration pneumonia.      Assessment & Plan:   Principal Problem:   Sepsis secondary to UTI [facility]) Active Problems:   Essential hypertension   Vitamin B 12 deficiency   Generalized anxiety disorder   GERD (gastroesophageal reflux disease)   ALS (amyotrophic lateral sclerosis) (HCC)   Sinus tachycardia   Oropharyngeal dysphagia   Loss of weight   Malnutrition of moderate degree   Aspiration into airway   Palliative care encounter   Goals of care, counseling/discussion   Encounter for hospice care discussion   Breathing difficult   Urinary tract infection with hematuria  Severe Sepsis secondary to E coli UTI:  He was started on IV rocephin.. Plan to change to augmentin to complete the course.  S/p PEG placed on 12/27.  Started him on tube feeds.    Aspiration pneumonia: Patient was started on Flagyl in addition to Rocephin.  He underwent PEG placement yesterday and tube feeds were started.   Oral thrush resume IV fluconazole.   History of ALS Rapidly progressive As needed lorazepam.   History of SVT resolved with Lopressor Hypertension better controlled, as needed hydralazine.   GERD on PPI.   Generalized anxiety disorder on paroxetine which is on hold for now.   Mild leukocytosis improving absolute neutrophil count probably from  infection.    DVT prophylaxis: (Lovenox) Code Status: DNR.  Family Communication:  Discussed with wife at bedside.  Disposition Plan: home hospice tomorrow.    Consultants:   Palliative care.    Procedures: none.    Antimicrobials: none.    Subjective: No issues since morning.  Plan to wean him off the bipap and watch him overnight.   Objective: Vitals:   01/12/17 1000 01/12/17 1100 01/12/17 1200 01/12/17 1201  BP: (!) 155/104 (!) 140/94 (!) 137/97   Pulse: 100 73 82   Resp: (!) 22 (!) 23 (!) 23   Temp:    98.9 F (37.2 C)  TempSrc:    Axillary  SpO2: 98% 99% 97% 98%  Weight:      Height:        Intake/Output Summary (Last 24 hours) at 01/12/2017 1425 Last data filed at 01/12/2017 0208 Gross per 24 hour  Intake 724.08 ml  Output 1500 ml  Net -775.92 ml   Filed Weights   01/08/17 0351 01/12/17 0400  Weight: 93.6 kg (206 lb 5.6 oz) 95.8 kg (211 lb 3.2 oz)    Examination:  General exam: Appears calm, on BIPAP.  Respiratory system: diminished at bases. No wheezing or rhonchi.  Cardiovascular system: S1 & S2 heard, RRR. No JVD, murmurs, rubs, gallops or clicks. No pedal edema. Gastrointestinal system: Abdomen is nondistended, soft and nontender. No organomegaly or masses felt. Normal bowel sounds heard. Central nervous system: Alert and appears  to communicate.  Extremities: Symmetric 5 x 5 power. Skin: No rashes, lesions or ulcers Psychiatry:  Mood & affect appropriate.     Data Reviewed: I  have personally reviewed following labs and imaging studies  CBC: Recent Labs  Lab 01/08/17 0646 01/09/17 0654 01/10/17 0559 01/11/17 0413 01/12/17 0541  WBC 20.8* 25.2* 18.1* 14.5* 12.6*  NEUTROABS 18.7* 21.8* 14.6 10.7* 9.1*  HGB 13.9 13.6 13.4 13.7 14.7  HCT 40.1 39.0 37.6* 39.2 40.9  MCV 85.7 85.2 84.5 83.2 82.6  PLT 185 179 219 275 312   Basic Metabolic Panel: Recent Labs  Lab 01/08/17 0020 01/08/17 0646 01/09/17 0654 01/10/17 0559  NA 141  138 135 138  K 4.4 3.6 3.7 3.5  CL 105 104 104 108  CO2 25 23 19* 17*  GLUCOSE 125* 113* 89 80  BUN 9 7 7 6   CREATININE 0.48* 0.48* 0.49* 0.40*  CALCIUM 9.0 8.4* 8.3* 8.3*  MG 1.9 1.7  --   --   PHOS 2.6 2.5  --   --    GFR: Estimated Creatinine Clearance: 116.5 mL/min (A) (by C-G formula based on SCr of 0.4 mg/dL (L)). Liver Function Tests: Recent Labs  Lab 01/08/17 0020  AST 17  ALT 22  ALKPHOS 77  BILITOT 1.0  PROT 7.4  ALBUMIN 3.8   No results for input(s): LIPASE, AMYLASE in the last 168 hours. No results for input(s): AMMONIA in the last 168 hours. Coagulation Profile: No results for input(s): INR, PROTIME in the last 168 hours. Cardiac Enzymes: No results for input(s): CKTOTAL, CKMB, CKMBINDEX, TROPONINI in the last 168 hours. BNP (last 3 results) No results for input(s): PROBNP in the last 8760 hours. HbA1C: No results for input(s): HGBA1C in the last 72 hours. CBG: Recent Labs  Lab 01/11/17 1730  GLUCAP 87   Lipid Profile: No results for input(s): CHOL, HDL, LDLCALC, TRIG, CHOLHDL, LDLDIRECT in the last 72 hours. Thyroid Function Tests: Recent Labs    01/11/17 0413  TSH 1.648   Anemia Panel: No results for input(s): VITAMINB12, FOLATE, FERRITIN, TIBC, IRON, RETICCTPCT in the last 72 hours. Sepsis Labs: Recent Labs  Lab 01/08/17 0020 01/10/17 0818  LATICACIDVEN 1.1 0.6    Recent Results (from the past 240 hour(s))  Blood Culture (routine x 2)     Status: None (Preliminary result)   Collection Time: 01/08/17 12:20 AM  Result Value Ref Range Status   Specimen Description RIGHT ANTECUBITAL  Final   Special Requests   Final    BOTTLES DRAWN AEROBIC AND ANAEROBIC Blood Culture adequate volume   Culture NO GROWTH 4 DAYS  Final   Report Status PENDING  Incomplete  Urine culture     Status: Abnormal   Collection Time: 01/08/17 12:41 AM  Result Value Ref Range Status   Specimen Description URINE, CLEAN CATCH  Final   Special Requests NONE  Final    Culture >=100,000 COLONIES/mL ESCHERICHIA COLI (A)  Final   Report Status 01/10/2017 FINAL  Final   Organism ID, Bacteria ESCHERICHIA COLI (A)  Final      Susceptibility   Escherichia coli - MIC*    AMPICILLIN <=2 SENSITIVE Sensitive     CEFAZOLIN <=4 SENSITIVE Sensitive     CEFTRIAXONE <=1 SENSITIVE Sensitive     CIPROFLOXACIN <=0.25 SENSITIVE Sensitive     GENTAMICIN <=1 SENSITIVE Sensitive     IMIPENEM <=0.25 SENSITIVE Sensitive     NITROFURANTOIN <=16 SENSITIVE Sensitive     TRIMETH/SULFA <=20 SENSITIVE Sensitive     AMPICILLIN/SULBACTAM <=2 SENSITIVE Sensitive     PIP/TAZO <=4 SENSITIVE Sensitive     Extended ESBL NEGATIVE Sensitive     * >=  100,000 COLONIES/mL ESCHERICHIA COLI  Blood Culture (routine x 2)     Status: None (Preliminary result)   Collection Time: 01/08/17 12:42 AM  Result Value Ref Range Status   Specimen Description BLOOD RIGHT HAND  Final   Special Requests   Final    BOTTLES DRAWN AEROBIC ONLY Blood Culture results may not be optimal due to an inadequate volume of blood received in culture bottles   Culture NO GROWTH 4 DAYS  Final   Report Status PENDING  Incomplete  MRSA PCR Screening     Status: None   Collection Time: 01/08/17  3:07 AM  Result Value Ref Range Status   MRSA by PCR NEGATIVE NEGATIVE Final    Comment:        The GeneXpert MRSA Assay (FDA approved for NASAL specimens only), is one component of a comprehensive MRSA colonization surveillance program. It is not intended to diagnose MRSA infection nor to guide or monitor treatment for MRSA infections.          Radiology Studies: Dg Chest Port 1 View  Result Date: 01/11/2017 CLINICAL DATA:  Shortness of Breath EXAM: PORTABLE CHEST 1 VIEW COMPARISON:  January 10, 2017 in January 08, 2017 FINDINGS: There is atelectatic change in the left base. Lungs elsewhere are clear. Heart is mildly enlarged with pulmonary vascularity within normal limits. No adenopathy. There is postoperative  change in the lower cervical spine. IMPRESSION: Left base atelectasis. Lungs elsewhere clear. Stable cardiac silhouette. Electronically Signed   By: Bretta BangWilliam  Woodruff III M.D.   On: 01/11/2017 17:55        Scheduled Meds: . feeding supplement (JEVITY 1.2 CAL)  1,000 mL Per Tube Q24H  . ipratropium-albuterol  3 mL Nebulization Q4H  . mouth rinse  15 mL Mouth Rinse BID  . metoprolol tartrate  2.5 mg Intravenous Q12H  . pantoprazole (PROTONIX) IV  40 mg Intravenous Q24H   Continuous Infusions: . cefTRIAXone (ROCEPHIN)  IV Stopped (01/12/17 0108)  . metronidazole Stopped (01/12/17 1147)     LOS: 4 days    Time spent: 35 minutes.    Kathlen ModyVijaya Wilma Michaelson, MD [facility] Pager [phone]   If 7PM-7AM, please contact night-coverage [email or web address] Password [email or web address] 01/12/2017, 2:25 PM

## 2017-01-12 NOTE — Progress Notes (Signed)
Initial Nutrition Assessment  DOCUMENTATION CODES:   Non-severe (moderate) malnutrition in context of chronic illness  INTERVENTION:   Agree with standard formula. Although he may need to transition later to a lower fiber formula such as Osmolite 1.2 due to his limited mobility. Understanding the palliative nature of his tube feeding this may not become and issue at all.  If patient wishes is to meet 100% estimated needs recommend he continue to advance rate of Jevity 1.2 to 70 ml/hr and add Prostat 30 ml BID.  Tube feeding regimen provides 2216  kcal (100% of needs), 120  grams of protein, and 1356 ml of H2O.  He will need additional free water flushes 200 ml QID. Total water 2156 ml daily not including flushes for med administration.  Monitor magnesium, potassium, and phosphorus daily for at least 3 days, MD to replete as needed, as pt is at risk for refeeding syndrome given his malnourished state.   NUTRITION DIAGNOSIS:   Moderate Malnutrition related to dysphagia, chronic illness(ALS ) as evidenced by percent weight loss (17% in 5 months) and decreased ability to meet energy needs orally in a hypermetabolic state   GOAL:   Patient will meet greater than or equal to 90% of their needs  MONITOR: Tolerance tube feeding, refeeding indications, weight changes   REASON FOR ASSESSMENT:   Consult TF management  ASSESSMENT:  The pt is a 56 yo male with hx of ALS (amyotrophic lateral sclerosis), GERD and HTN. His wife is at bedside. The patient also has a device here that he uses for communication.   12/24: His weight has dropped severely 17% (~44 lbs) in the past 5 months. His wife confirms with the patient that he would like to talk with MD regarding potential PEG tube placement.   He is having difficulty eating now and has been unable to consume sufficient calories orally to maintain his usual weight. At home he has been tolerating a pureed diet with thin liquids but wife says it  takes him a very long time to eat and it wears him out. Although muscle loss cannot be prevented with ALS, muscle loss can increase if nutritionally compromised. Calorie needs can also change throughout the disease progression.   As it takes more effort to complete daily activities, the body requires more calories. A decreased appetite, feeling full sooner, difficulty chewing and swallowing and trouble self-feeding due to muscle weakness can all contribute to a calorie deficit that leads to weight loss.  Clinical guidelines: PEG tube is indicated for ALS pt who have symptomatic dysphagia, prolonged eating time, negative calorie balance and unintentional weight loss > 5-10%. Presumed benefits include optimal nutrition and fluid intake/delivery, decreased risk of food aspiration and avenue for medication delivery.    On 12/24 he was assessed by ST and recommends FEES study. Moderate/servere dysphagia and is severe risk for aspiration. Alternative feedings recommended long term.  12/28-GI procedures: EGD findings normal esophagus and appropriate site identified for PEG tube. Patient has star tube feeding. MD has initiated standard formula: Jevity 1.2 @ 35 ml/hr which provides 1008 kcal, 47 gr protein 678 ml water every 24 hours.  Discussed with his nurse who reports pt is tolerating. [facility].    Recent Labs  Lab 01/08/17 0020 01/08/17 0646 01/09/17 0654 01/10/17 0559  NA 141 138 135 138  K 4.4 3.6 3.7 3.5  CL 105 104 104 108  CO2 25 23 19* 17*  BUN 9 7 7 6   CREATININE 0.48* 0.48* 0.49* 0.40*  CALCIUM 9.0 8.4* 8.3* 8.3*  MG 1.9 1.7  --   --   PHOS 2.6 2.5  --   --   GLUCOSE 125* 113* 89 80   Labs : Mag and phos -WNL.    NUTRITION - FOCUSED PHYSICAL EXAM: Muscle atrophy of muscles under voluntary control  expected given pt progression of ALS and muscle loss will be accelerated in the setting of inadequate nutrition  intake  Diet Order:  Diet NPO time specified  EDUCATION NEEDS:   No education needs have been identified at this time  Skin:  Skin Assessment: Reviewed RN Assessment  Last BM:  12/27  Height:   Ht Readings from Last 1 Encounters:  01/08/17 6\' 1"  (1.854 m)    Weight:   Wt Readings from Last 1 Encounters:  01/12/17 211 lb 3.2 oz (95.8 kg)    Ideal Body Weight:  84 kg  BMI:  Body mass index is 27.86 kg/m.  Estimated Nutritional Needs:   Kcal:  1610-96042256-2538  Protein:  113-131   Fluid:  2.3-2.5 liters daily   Royann ShiversLynn Clora Ohmer MS,RD,CSG,LDN Office: #[phone] Pager: [phone]

## 2017-01-12 NOTE — Care Management (Signed)
Patient Information   Patient Name Steve Koch, Steve Koch ([id number]) Sex Male DOB 25-Feb-1960  Room Bed  IC09 IC09-01  Patient Demographics   Address [address] [place] [address] Phone [phone] (Home) *Preferred* E-mail Address [email or web address]  Patient Ethnicity & Race   Ethnic Group Patient Race  Not Hispanic or Latino White or Caucasian  Emergency Contact(s)   Name Relation Home Work Mobile  Laroque,Dawn Spouse [phone]    Documents on File    Status Date Received Description  Documents for the Patient  [place] E-Signature HIPAA Notice of Privacy Received 07/31/13   [place] E-Signature HIPAA Notice of Privacy Spanish     [place] HIPAA NOTICE OF PRIVACY - Scanned Not Received    Driver's License Received 07/31/13   Insurance Card Received 07/31/13 BCBS-AM  Advance Directives/Living Will/HCPOA/POA Received 03/09/16   Advanced Beneficiary Notice (ABN) Not Received    Other Photo ID Not Received    [place] E-Signature HIPAA Notice of Privacy Signed 03/03/16   HIM ROI Authorization  03/24/16   HIM ROI Authorization  03/29/16   HIM ROI Authorization  04/12/16 LEGAL  HIM ROI Authorization  04/13/16 LEGAL  HIM ROI Authorization  04/19/16 SIGN AUTHO  HIM ROI Authorization  05/26/16   HIM ROI Authorization  05/30/16   HIM ROI Authorization  06/29/16 sign autho  HIM ROI Authorization  07/17/16 [employer]  Insurance Card Received 08/04/16 bcbs 2018  AMB Correspondence  06/20/16 [facility]  Release of Information Received 08/04/16 DPR [id number]  HIM ROI Authorization  08/16/16 SIGN AUTHO  HIM ROI Authorization  08/25/16   HIM ROI Authorization  12/29/16 [employer]  HIM ROI Authorization  12/29/16 [place] DDS  HIM Release of Information Output  12/29/16 History and Physical, Operative and Procedural Report, Consultation Report, Discharge Summary, Notes Individual  HIM Release of Information  Output  12/30/16 Abstract - No Scans  HIM Release of Information Output  12/30/16 Abstract of Health Information  Documents for the Encounter  AOB (Assignment of Insurance Benefits) Not Received    E-signature AOB Signed 01/07/17   MEDICARE RIGHTS Not Received    E-signature Medicare Rights     ED Patient Billing Extract   ED PB Billing Extract  Cardiac Monitoring Strip Shift Summary Received 01/08/17   Cardiac Monitoring Strip Received 01/09/17   EKG Received 01/08/17   Upper Endoscopy Electronic Received 01/11/17   Admission Information   Attending Provider Admitting Provider Admission Type Admission Date/Time  Kathlen ModyAkula, Vijaya, MD Bobette Mortiz, David Manuel, MD Emergency 01/07/17 2316  Discharge Date Hospital Service Auth/Cert Status Service Area   Internal Medicine Incomplete [place] SERVICE AREA  Unit Room/Bed Admission Status   AP-ICCUP NURSING IC09/IC09-01 Admission (Confirmed)   Admission   Complaint  Hematuria  Hospital Account   Name Acct ID Class Status Primary Coverage  Steve Koch, Steve Koch [id number] Inpatient Open BLUE CROSS BLUE SHIELD - BCBS OTHER      Guarantor Account (for Hospital Account [id number])   Name Relation to Pt Service Area Active? Acct Type  Steve Koch, Steve Koch Self CHSA Yes Personal/Family  Address Phone    [address] [place], [place] [address] [phone](H)        Coverage Information (for Hospital Account [id number])   F/O Payor/Plan Precert #  BLUE CROSS BLUE SHIELD/BCBS OTHER   Subscriber Subscriber #  Steve Koch, Steve Koch [id number]  Address Phone  [address] [place], [place] [address] [phone]

## 2017-01-12 NOTE — Care Management (Signed)
Discussed with patient and wife. Patient does have a hospital bed at home already but wife would like a new bed with air mattress. Will need a kangaroo pump and  tube feeds.  [facility] notified. Wife would like for hospice to be able to come do intake today. Debbie of [facility] notified, she does report that intake may not happen today or this weekend. She will call CM back to verify.

## 2017-01-12 NOTE — Care Management (Signed)
[facility]. They are aware of patient and was coming to do intake on Christmas when patient was admitted to hospital. CM will fax any needed information and DC summary when patient discharges.

## 2017-01-12 NOTE — Care Management (Signed)
Spoke with [employer] [facility]. Hopsice RN can come do intake tomorrow.  Pump and Tube feeds are being ordered today and over-nighted to patient's home.  Once equipment is delivered, patient's wife will alert [facility] staff  and hospice RN and patient can discharge home.  Dawn (wife) given number for [facility].  [phone]( [phone] ).

## 2017-01-13 LAB — CBC WITH DIFFERENTIAL/PLATELET
BASOS ABS: 0.1 10*3/uL (ref 0.0–0.1)
Basophils Relative: 1 %
EOS ABS: 0.4 10*3/uL (ref 0.0–0.7)
Eosinophils Relative: 3 %
HCT: 39.9 % (ref 39.0–52.0)
Hemoglobin: 14.4 g/dL (ref 13.0–17.0)
LYMPHS ABS: 2.5 10*3/uL (ref 0.7–4.0)
Lymphocytes Relative: 19 %
MCH: 29.5 pg (ref 26.0–34.0)
MCHC: 36.1 g/dL — ABNORMAL HIGH (ref 30.0–36.0)
MCV: 81.8 fL (ref 78.0–100.0)
MONO ABS: 1.2 10*3/uL — AB (ref 0.1–1.0)
Monocytes Relative: 9 %
Neutro Abs: 9.1 10*3/uL — ABNORMAL HIGH (ref 1.7–7.7)
Neutrophils Relative %: 68 %
PLATELETS: 349 10*3/uL (ref 150–400)
RBC: 4.88 MIL/uL (ref 4.22–5.81)
RDW: 14.4 % (ref 11.5–15.5)
WBC: 13.3 10*3/uL — AB (ref 4.0–10.5)

## 2017-01-13 LAB — CULTURE, BLOOD (ROUTINE X 2)
Culture: NO GROWTH
Culture: NO GROWTH
SPECIAL REQUESTS: ADEQUATE

## 2017-01-13 MED ORDER — IPRATROPIUM-ALBUTEROL 0.5-2.5 (3) MG/3ML IN SOLN
3.0000 mL | Freq: Four times a day (QID) | RESPIRATORY_TRACT | Status: DC
  Administered 2017-01-13 (×2): 3 mL via RESPIRATORY_TRACT
  Filled 2017-01-13 (×2): qty 3

## 2017-01-13 MED ORDER — METOPROLOL TARTRATE 25 MG PO TABS: 12.5000 mg | ORAL_TABLET | Freq: Two times a day (BID) | ORAL | 0 refills | Status: AC

## 2017-01-13 MED ORDER — JEVITY 1.2 CAL PO LIQD: 1000.0000 mL | ORAL | 10 refills | Status: AC

## 2017-01-13 MED ORDER — CEPHALEXIN 500 MG PO CAPS: 500.0000 mg | ORAL_CAPSULE | Freq: Two times a day (BID) | ORAL | 0 refills | Status: AC

## 2017-01-13 NOTE — Discharge Summary (Signed)
Physician Discharge Summary  Steve Koch ZOX:[id number] DOB: 10-Aug-1960 DOA: 01/07/2017  PCP: Nathen MayPllc, [facility]  Admit date: 01/07/2017 Discharge date: 01/13/2017  Admitted From: Home Disposition: Home hospice  Recommendations for Outpatient Follow-up:  1. Follow-up with PCP/hospice MD as needed  Home Health:yes  Discharge Condition: Hospice CODE STATUS: DNR Diet recommendation: Tube feeds  Brief/Interim Summary: Steve Ranharles Pattesonis a 56 Koch medical history significant of recently diagnosed and rapidly progressive ALS, anxiety, right hand carpal tunnel syndrome, GERD, hypertension, postoperative nausea and vomiting who was brought to the emergency department by family members due to fever today associated with hematuria, nausea and one episode of emesis the previous night, urinary frequency and decreased urinary output. He was diagnosed with severe sepsis, UTI, dehydration and aspiration pneumonia.     Discharge Diagnoses:  Principal Problem:   Sepsis secondary to UTI [facility](HCC) Active Problems:   Essential hypertension   Vitamin B 12 deficiency   Generalized anxiety disorder   GERD (gastroesophageal reflux disease)   ALS (amyotrophic lateral sclerosis) (HCC)   Sinus tachycardia   Oropharyngeal dysphagia   Loss of weight   Malnutrition of moderate degree   Aspiration into airway   Palliative care encounter   Goals of care, counseling/discussion   Encounter for hospice care discussion   Breathing difficult   Urinary tract infection with hematuria  Severe Sepsis secondary to E coli UTI:  He was started on IV Rocephin transition to Keflex on discharge. S/p PEG placed on 12/27.  Started him on tube feeds.    Aspiration pneumonia: Patient was started on Flagyl in addition to Rocephin.  He underwent PEG placement  and tube feeds were started.  He completed 6 days of IV antibiotics.   Oral thrush completed Diflucan course.   History of  ALS Rapidly progressive As needed lorazepam.   History of SVT resolved with Lopressor.  Resume Lopressor on discharge  Hypertension better controlled,.  GERD on PPI.   Generalized anxiety disorder on paroxetine, resume on discharge   Mild leukocytosis improving absolute neutrophil count probably from infection.     Discharge Instructions  Discharge Instructions    Diet - low sodium heart healthy   Complete by:  As directed    Discharge instructions   Complete by:  As directed    Please follow up with Hospice MD as recommended.     Allergies as of 01/13/2017      Reactions   Lyrica [pregabalin] Swelling      Medication List    STOP taking these medications   amLODipine 2.5 MG tablet Commonly known as:  NORVASC   fluticasone 50 MCG/ACT nasal spray Commonly known as:  FLONASE   traZODone 50 MG tablet Commonly known as:  DESYREL     TAKE these medications   aspirin EC 81 MG tablet Take 81 mg by mouth daily.   baclofen 10 MG tablet Commonly known as:  LIORESAL Take 1 tablet (10 mg total) by mouth 4 (four) times daily. What changed:    when to take this  additional instructions   cephALEXin 500 MG capsule Commonly known as:  KEFLEX Take 1 capsule (500 mg total) by mouth 2 (two) times daily for 2 days.   cyanocobalamin 500 MCG tablet Take 1 tablet (500 mcg total) by mouth daily.   feeding supplement (JEVITY 1.2 CAL) Liqd Place 1,000 mLs into feeding tube daily.   omeprazole 40 MG capsule Commonly known as:  PRILOSEC Take 40 mg by mouth daily.   PARoxetine  20 MG tablet Commonly known as:  PAXIL Take 20 mg by mouth daily.   zolpidem 10 MG tablet Commonly known as:  AMBIEN Take 10 mg by mouth at bedtime as needed for sleep.      Follow-up Information    [facility] Schedule an appointment as soon as possible for a visit in 1 week(s).   Specialty:  Family Medicine Contact information: [address]  [place] [id number] [phone]          Allergies  Allergen Reactions  . Lyrica [Pregabalin] Swelling    Consultations:  Palliative care.    Procedures/Studies: Dg Chest Port 1 View  Result Date: 01/11/2017 CLINICAL DATA:  Shortness of Breath EXAM: PORTABLE CHEST 1 VIEW COMPARISON:  January 10, 2017 in January 08, 2017 FINDINGS: There is atelectatic change in the left base. Lungs elsewhere are clear. Heart is mildly enlarged with pulmonary vascularity within normal limits. No adenopathy. There is postoperative change in the lower cervical spine. IMPRESSION: Left base atelectasis. Lungs elsewhere clear. Stable cardiac silhouette. Electronically Signed   By: Bretta BangWilliam  Woodruff III M.D.   On: 01/11/2017 17:55   Dg Chest Port 1 View  Result Date: 01/10/2017 CLINICAL DATA:  Aspiration pneumonia. EXAM: PORTABLE CHEST 1 VIEW COMPARISON:  01/08/2017 FINDINGS: There is persistent bibasilar atelectasis at least in part due to a shallow inspiration. No significant change. Heart size and vascularity are normal. No significant bone abnormality. IMPRESSION: No change in the appearance of the chest. Bibasilar atelectasis, at least in part due to a shallow inspiration. No lung consolidation. Electronically Signed   By: Francene BoyersJames  Maxwell M.D.   On: 01/10/2017 07:55   Dg Chest Port 1 View  Result Date: 01/08/2017 CLINICAL DATA:  ALS.  Symptoms of dysphagia, aspiration. EXAM: PORTABLE CHEST 1 VIEW COMPARISON:  None. FINDINGS: The heart size and mediastinal contours are within normal limits. Lung volumes are low. Bibasilar opacities, left greater than right, are likely consistent with atelectasis. Early aspiration pneumonia cannot be excluded. The right hemidiaphragm is elevated. No acute rod, pleural fluid or pneumothorax identified. The visualized skeletal structures are unremarkable. IMPRESSION: Low lung volumes with bibasilar opacities likely representing atelectasis. Early aspiration pneumonia  cannot be excluded, especially on the left. Electronically Signed   By: Irish LackGlenn  Yamagata M.D.   On: 01/08/2017 17:52    None.    Subjective: No new complaints.   Discharge Exam: Vitals:   01/13/17 0600 01/13/17 0828  BP: (!) 157/101   Pulse: 73   Resp: 18   Temp:    SpO2: 96% 98%   Vitals:   01/13/17 0400 01/13/17 0500 01/13/17 0600 01/13/17 0828  BP: (!) 155/86 (!) 141/108 (!) 157/101   Pulse: 93 82 73   Resp: 20 20 18    Temp: 97.7 F (36.5 C)     TempSrc: Axillary     SpO2: 98% 98% 96% 98%  Weight: 91.8 kg (202 lb 6.1 oz)     Height:        General: Pt is alert, awake, not in acute distress Cardiovascular: RRR, S1/S2 +, no rubs, no gallops Respiratory: CTA bilaterally, no wheezing, no rhonchi Abdominal: Soft, NT, ND, bowel sounds + Extremities: no edema, no cyanosis    The results of significant diagnostics from this hospitalization (including imaging, microbiology, ancillary and laboratory) are listed below for reference.     Microbiology: Recent Results (from the past 240 hour(s))  Blood Culture (routine x 2)     Status:  None   Collection Time: 01/08/17 12:20 AM  Result Value Ref Range Status   Specimen Description RIGHT ANTECUBITAL  Final   Special Requests   Final    BOTTLES DRAWN AEROBIC AND ANAEROBIC Blood Culture adequate volume   Culture NO GROWTH 5 DAYS  Final   Report Status 01/13/2017 FINAL  Final  Urine culture     Status: Abnormal   Collection Time: 01/08/17 12:41 AM  Result Value Ref Range Status   Specimen Description URINE, CLEAN CATCH  Final   Special Requests NONE  Final   Culture >=100,000 COLONIES/mL ESCHERICHIA COLI (A)  Final   Report Status 01/10/2017 FINAL  Final   Organism ID, Bacteria ESCHERICHIA COLI (A)  Final      Susceptibility   Escherichia coli - MIC*    AMPICILLIN <=2 SENSITIVE Sensitive     CEFAZOLIN <=4 SENSITIVE Sensitive     CEFTRIAXONE <=1 SENSITIVE Sensitive     CIPROFLOXACIN <=0.25 SENSITIVE Sensitive      GENTAMICIN <=1 SENSITIVE Sensitive     IMIPENEM <=0.25 SENSITIVE Sensitive     NITROFURANTOIN <=16 SENSITIVE Sensitive     TRIMETH/SULFA <=20 SENSITIVE Sensitive     AMPICILLIN/SULBACTAM <=2 SENSITIVE Sensitive     PIP/TAZO <=4 SENSITIVE Sensitive     Extended ESBL NEGATIVE Sensitive     * >=100,000 COLONIES/mL ESCHERICHIA COLI  Blood Culture (routine x 2)     Status: None   Collection Time: 01/08/17 12:42 AM  Result Value Ref Range Status   Specimen Description BLOOD RIGHT HAND  Final   Special Requests   Final    BOTTLES DRAWN AEROBIC ONLY Blood Culture results may not be optimal due to an inadequate volume of blood received in culture bottles   Culture NO GROWTH 5 DAYS  Final   Report Status 01/13/2017 FINAL  Final  MRSA PCR Screening     Status: None   Collection Time: 01/08/17  3:07 AM  Result Value Ref Range Status   MRSA by PCR NEGATIVE NEGATIVE Final    Comment:        The GeneXpert MRSA Assay (FDA approved for NASAL specimens only), is one component of a comprehensive MRSA colonization surveillance program. It is not intended to diagnose MRSA infection nor to guide or monitor treatment for MRSA infections.      Labs: BNP (last 3 results) No results for input(s): BNP in the last 8760 hours. Basic Metabolic Panel: Recent Labs  Lab 01/08/17 0020 01/08/17 0646 01/09/17 0654 01/10/17 0559  NA 141 138 135 138  K 4.4 3.6 3.7 3.5  CL 105 104 104 108  CO2 25 23 19* 17*  GLUCOSE 125* 113* 89 80  BUN 9 7 7 6   CREATININE 0.48* 0.48* 0.49* 0.40*  CALCIUM 9.0 8.4* 8.3* 8.3*  MG 1.9 1.7  --   --   PHOS 2.6 2.5  --   --    Liver Function Tests: Recent Labs  Lab 01/08/17 0020  AST 17  ALT 22  ALKPHOS 77  BILITOT 1.0  PROT 7.4  ALBUMIN 3.8   No results for input(s): LIPASE, AMYLASE in the last 168 hours. No results for input(s): AMMONIA in the last 168 hours. CBC: Recent Labs  Lab 01/09/17 0654 01/10/17 0559 01/11/17 0413 01/12/17 0541 01/13/17 0502   WBC 25.2* 18.1* 14.5* 12.6* 13.3*  NEUTROABS 21.8* 14.6 10.7* 9.1* 9.1*  HGB 13.6 13.4 13.7 14.7 14.4  HCT 39.0 37.6* 39.2 40.9 39.9  MCV 85.2 84.5 83.2  82.6 81.8  PLT 179 219 275 312 349   Cardiac Enzymes: No results for input(s): CKTOTAL, CKMB, CKMBINDEX, TROPONINI in the last 168 hours. BNP: Invalid input(s): POCBNP CBG: Recent Labs  Lab 01/11/17 1730  GLUCAP 87   D-Dimer No results for input(s): DDIMER in the last 72 hours. Hgb A1c No results for input(s): HGBA1C in the last 72 hours. Lipid Profile No results for input(s): CHOL, HDL, LDLCALC, TRIG, CHOLHDL, LDLDIRECT in the last 72 hours. Thyroid function studies Recent Labs    01/11/17 0413  TSH 1.648   Anemia work up No results for input(s): VITAMINB12, FOLATE, FERRITIN, TIBC, IRON, RETICCTPCT in the last 72 hours. Urinalysis    Component Value Date/Time   COLORURINE YELLOW 01/08/2017 0041   APPEARANCEUR HAZY (A) 01/08/2017 0041   LABSPEC 1.024 01/08/2017 0041   PHURINE 6.0 01/08/2017 0041   GLUCOSEU NEGATIVE 01/08/2017 0041   HGBUR LARGE (A) 01/08/2017 0041   BILIRUBINUR NEGATIVE 01/08/2017 0041   KETONESUR 80 (A) 01/08/2017 0041   PROTEINUR 100 (A) 01/08/2017 0041   NITRITE POSITIVE (A) 01/08/2017 0041   LEUKOCYTESUR LARGE (A) 01/08/2017 0041   Sepsis Labs Invalid input(s): PROCALCITONIN,  WBC,  LACTICIDVEN Microbiology Recent Results (from the past 240 hour(s))  Blood Culture (routine x 2)     Status: None   Collection Time: 01/08/17 12:20 AM  Result Value Ref Range Status   Specimen Description RIGHT ANTECUBITAL  Final   Special Requests   Final    BOTTLES DRAWN AEROBIC AND ANAEROBIC Blood Culture adequate volume   Culture NO GROWTH 5 DAYS  Final   Report Status 01/13/2017 FINAL  Final  Urine culture     Status: Abnormal   Collection Time: 01/08/17 12:41 AM  Result Value Ref Range Status   Specimen Description URINE, CLEAN CATCH  Final   Special Requests NONE  Final   Culture >=100,000  COLONIES/mL ESCHERICHIA COLI (A)  Final   Report Status 01/10/2017 FINAL  Final   Organism ID, Bacteria ESCHERICHIA COLI (A)  Final      Susceptibility   Escherichia coli - MIC*    AMPICILLIN <=2 SENSITIVE Sensitive     CEFAZOLIN <=4 SENSITIVE Sensitive     CEFTRIAXONE <=1 SENSITIVE Sensitive     CIPROFLOXACIN <=0.25 SENSITIVE Sensitive     GENTAMICIN <=1 SENSITIVE Sensitive     IMIPENEM <=0.25 SENSITIVE Sensitive     NITROFURANTOIN <=16 SENSITIVE Sensitive     TRIMETH/SULFA <=20 SENSITIVE Sensitive     AMPICILLIN/SULBACTAM <=2 SENSITIVE Sensitive     PIP/TAZO <=4 SENSITIVE Sensitive     Extended ESBL NEGATIVE Sensitive     * >=100,000 COLONIES/mL ESCHERICHIA COLI  Blood Culture (routine x 2)     Status: None   Collection Time: 01/08/17 12:42 AM  Result Value Ref Range Status   Specimen Description BLOOD RIGHT HAND  Final   Special Requests   Final    BOTTLES DRAWN AEROBIC ONLY Blood Culture results may not be optimal due to an inadequate volume of blood received in culture bottles   Culture NO GROWTH 5 DAYS  Final   Report Status 01/13/2017 FINAL  Final  MRSA PCR Screening     Status: None   Collection Time: 01/08/17  3:07 AM  Result Value Ref Range Status   MRSA by PCR NEGATIVE NEGATIVE Final    Comment:        The GeneXpert MRSA Assay (FDA approved for NASAL specimens only), is one component of a comprehensive MRSA  colonization surveillance program. It is not intended to diagnose MRSA infection nor to guide or monitor treatment for MRSA infections.      Time coordinating discharge: Over 30 minutes  SIGNED:   Kathlen Mody, MD  [facility] 01/13/2017, 2:16 PM Pager   If 7PM-7AM, please contact night-coverage [email or web address] Password [email or web address]

## 2017-01-13 NOTE — Discharge Instructions (Signed)
PEG Tube Home Guide A PEG tube is used to put food and fluids into the stomach. Before you leave the hospital, make sure that you know:  How to care for your PEG tube.  How to care for the opening (stoma) in your belly.  How to give yourself feedings and medicines.  When to call your doctor for help.  How do I care for my peg tube? Check your PEG tube every day. Make sure:  It is not too tight.  It is in the right place. There is a mark on the tube that shows when the tube is in the right place. Adjust the tube if you need to.  How do I care for my stoma? Clean your stoma every day. Follow these steps: 1. Wash your hands with soap and water. 2. Wash the stoma gently with warm, soapy water. 3. Rinse the stoma with warm water. 4. Pat the stoma area dry.  Check your stoma for:  Redness.  Leaking.  Skin irritation.  How do I give a feeding? Your doctor will tell you:  How much nutrition and fluid you will need for each feeding.  How often to have a feeding.  Whether you should take medicine in the tube by itself or with a feeding.  To give yourself a feeding, follow these steps. 1. Lay out all of the things that you will need. 2. Make sure that the nutritional formula is at room temperature. 3. Wash your hands with soap and water. 4. Sit up or stand up straight. You will need to stay straight while you give yourself a feeding. 5. Make sure the syringe plunger is pushed in. Place the tip of the syringe in clear water, and slowly pull the plunger to bring (draw up) the water into the syringe. 6. Remove the clamp and the cap from the PEG tube. 7. Push the water out of the syringe to clean (flush) the tube. 8. If the tube is clear, draw up the formula into the syringe. Make sure to use the right amount for each feeding. Add water if you need to. 9. Slowly push the formula from the syringe through the tube. 10. After the feeding, flush the tube with water. 11. Put the  clamp and the cap on the tube. 12. Stay sitting up or standing up straight for at least 30 minutes.  How do I give medicine? To give yourself medicine, follow these steps: 1. Lay out all of the things that you will need. 2. If your medicine is in tablet form, crush it and dissolve it in water. 3. Wash your hands with soap and water. 4. Sit up or stand up straight. You will need to stay straight while you give yourself medicine. 5. Make sure the syringe plunger is pushed in. Place the tip of the syringe in clear water, and slowly pull the plunger to bring (draw up) the water into the syringe. 6. Remove the clamp and the cap from the PEG tube. 7. Push the water out of the syringe to clean (flush) the tube. 8. If the tube is clear, draw up the medicine into the syringe. 9. Slowly push the medicine from the syringe through the tube. 10. Flush the tube with water. 11. Put the clamp and the cap on the tube. 12. Stay sitting up or standing up straight for at least 30 minutes.  You should not take sustained release (SR) medicines through your tube. If you are not sure if your  medicine is a SR medicine, ask your doctor or pharmacist. Get help if:  The area around your stoma is sore, irritated, or red.  You have belly pain or bloating while you are feeding or afterward.  You have a feeling of being sick to your stomach (nausea) that does not go away.  You cannot poop (constipated) or you have watery poop (diarrhea) for a long time.  You have a fever.  You have problems with your PEG tube. Get help right away if:  Your tube is blocked.  Your tube falls out.  You have pain around your tube.  You are bleeding from your tube.  Your tube is leaking.  You choke or you have trouble breathing while you are feeding or afterward. This information is not intended to replace advice given to you by your health care provider. Make sure you discuss any questions you have with your health care  provider. Document Released: 02/04/2010 Document Revised: 06/10/2015 Document Reviewed: 01/07/2014 Elsevier Interactive Patient Education  2018 [employer] Aspiration Pneumonia Aspiration pneumonia is an infection in your lungs. It occurs when food, liquid, or stomach contents (vomit) are inhaled (aspirated) into your lungs. When these things get into your lungs, swelling (inflammation) and infection can occur. This can make it difficult for you to breathe. Aspiration pneumonia is a serious condition and can be life threatening. What increases the risk? Aspiration pneumonia is more likely to occur when a person's cough (gag) reflex or ability to swallow has been decreased. Some things that can do this include:  Having a brain injury or disease, such as stroke, seizures, Parkinson's disease, dementia, or amyotrophic lateral sclerosis (ALS).  Being given general anesthetic for procedures.  Being in a coma (unconscious).  Having a narrowing of the tube that carries food to the stomach (esophagus).  Drinking too much alcohol. If a person passes out and vomits, vomit can be swallowed into the lungs.  Taking certain medicines, such as tranquilizers or sedatives.  What are the signs or symptoms?  Coughing after swallowing food or liquids.  Breathing problems, such as wheezing or shortness of breath.  Bluish skin. This can be caused by lack of oxygen.  Coughing up food or mucus. The mucus might contain blood, greenish material, or yellowish-white fluid (pus).  Fever.  Chest pain.  Being more tired than usual (fatigue).  Sweating more than usual.  Bad breath. How is this diagnosed? A physical exam will be done. During the exam, the health care provider will listen to your lungs with a stethoscope to check for:  Crackling sounds in the lungs.  Decreased breath sounds.  A rapid heartbeat.  Various tests may be ordered. These may include:  Chest X-ray.  CT  scan.  Swallowing study. This test looks at how food is swallowed and whether it goes into your breathing tube (trachea) or food pipe (esophagus).  Sputum culture. Saliva and mucus (sputum) are collected from the lungs or the tubes that carry air to the lungs (bronchi). The sputum is then tested for bacteria.  Bronchoscopy. This test uses a flexible tube (bronchoscope) to see inside the lungs.  How is this treated? Treatment will usually include antibiotic medicines. Other medicines may also be used to reduce fever or pain. You may need to be treated in the hospital. In the hospital, your breathing will be carefully monitored. Depending on how well you are breathing, you may need to be given oxygen, or you may need breathing support from a breathing  machine (ventilator). For people who fail a swallowing study, a feeding tube might be placed in the stomach, or they may be asked to avoid certain food textures or liquids when they eat. Follow these instructions at home:  Carefully follow any special eating instructions you were given, such as avoiding certain food textures or thickening liquids. This reduces the risk of developing aspiration pneumonia again.  Only take over-the-counter or prescription medicines as directed by your health care provider. Follow the directions carefully.  If you were prescribed antibiotics, take them as directed. Finish them even if you start to feel better.  Rest as instructed by your health care provider.  Keep all follow-up appointments with your health care provider. Contact a health care provider if:  You develop worsening shortness of breath, wheezing, or difficulty breathing.  You develop a fever.  You have chest pain. This information is not intended to replace advice given to you by your health care provider. Make sure you discuss any questions you have with your health care provider. Document Released: 10/30/2008 Document Revised: 06/16/2015 Document  Reviewed: 06/20/2012 Elsevier Interactive Patient Education  2017 [employer]  Aspiration Precautions, Adult Aspiration is the breathing in (inhalation) of a liquid or object into the lungs. Things that can be inhaled into the lungs include:  Food.  Any type of liquid, such as drinks or saliva.  Stomach contents, such as vomit or stomach acid.  What are the signs of aspiration? Signs of aspiration include:  Coughing after swallowing food or liquids.  Clearing the throat often while eating.  Trouble breathing. This may include: ? Breathing quickly. ? Breathing very slowly. ? Loud breathing. ? Rumbling sounds from the lungs while breathing.  Coughing up phlegm (sputum) that: ? Is yellow, tan, or green. ? Has pieces of food in it. ? Is bad-smelling.  Having a hoarse, barky cough.  Not being able to speak.  A hoarse voice.  Drooling while eating.  A feeling of fullness in the throat or a feeling that something is stuck in the throat.  Choking often.  Having a runny noise while eating.  Coughing when lying down or having to sit up quickly after lying down.  A change in skin color. The skin may look red or blue.  Fever.  Watery eyes.  Pain in the chest or back.  A pained look on the face.  What are the complications of aspiration? Complications of aspiration include:  Losing weight because the person is not absorbing needed nutrients.  Loss of enjoyment and the social benefits of eating.  Choking.  Lung irritation, if someone aspirates acidic food or drinks.  Lung infection (pneumonia).  Collection of infected liquid (pus) in the lungs (lung abscess).  In serious cases, death can occur. What can I do to prevent aspiration? Caring for someone who has a feeding tube If you are caring for someone who has a feeding tube who cannot eat or drink safely through his or her mouth:  Keep the person in an upright position as much as possible.  Do not  lay the person flat if he or she is getting continuous feedings. If you need to lay the person flat for any reason, turn the feeding pump off.  Check feeding tube residuals as told by your health care provider. Ask your health care provider what residual amount is too high.  Caring for someone who can eat and drink safely by mouth If you are caring for someone who can eat  and drink safely through his or her mouth:  Have the person sit in an upright position when eating food or drinking fluids. This can be done in two ways: ? Have the person sit up in a chair. ? If sitting in a chair is not possible, position the person in bed so he or she is upright.  Remind the person to eat slowly and chew well. Make sure the person is awake and alert while eating.  Do not distract the person. This is especially important for people with thinking or memory (cognitive) problems.  Allow foods to cool. Hot foods may be more difficult to swallow.  Provide small meals more frequently, instead of 3 large meals. This may reduce fatigue during eating.  Check the person's mouth thoroughly for leftover food after eating.  Keep the person sitting upright for 30-45 minutes after eating.  Do not serve food or drink during 2 hours or more before bedtime.  General instructions Follow these general guidelines to prevent aspiration in someone who can eat and drink safely by mouth:  Never put food or liquids in the mouth of a person who is not fully alert.  Feed small amounts of food. Do not force feed.  For a person who is on a diet for swallowing difficulty (dysphagia diet), follow the recommended food and drink consistency. For example, in dysphagia diet level 1, thicken liquids to pudding-like consistency.  Use as little water as possible when brushing the person's teeth or cleaning his or her mouth.  Provide oral care before and after meals.  Use adaptive devices such as cut-out cups, straws, or utensils  as told by the health care provider.  Crush pills and put them in soft food such as pudding or ice cream. Some pills should not be crushed. Check with the health care provider before crushing any medicine.  Contact a health care provider if:  The person has a feeding tube, and the feeding tube residual amount is too high.  The person has a fever.  The person tries to avoid food or water, such as refusing to eat, drink, or be fed, or is eating less than normal.  The person may have aspirated food or liquid.  You notice warning signs, such as choking or coughing, when the person eats or drinks. Get help right away if:  The person has trouble breathing or starts to breathe quickly.  The person is breathing very slowly or stops breathing.  The person coughs a lot after eating or drinking.  The person has a long-lasting (chronic) cough.  The person coughs up thick, yellow, or tan sputum.  If someone is choking on food or an object, perform the Heimlich maneuver (abdominal thrusts).  The person has symptoms of pneumonia, such as: ? Coughing a lot. ? Coughing up mucus with a bad smell or blood in it. ? Feeling short of breath. ? Complaining of chest pain. ? Sweating, fever, and chills. ? Feeling tired. ? Complaining of trouble breathing. ? Wheezing.  The person cannot stop choking.  The person is unable to breathe, turns blue, faints, or seems confused. These symptoms may represent a serious problem that is an emergency. Do not wait to see if the symptoms will go away. Get medical help right away. Call your local emergency services (911 in the [place]  Summary  Aspiration is the breathing in (inhalation) of a liquid or object into the lungs. Things that can be inhaled into the lungs include food, liquids,  saliva, or stomach contents.  Aspiration can cause pneumonia or choking.  One sign of aspiration is coughing after swallowing food or liquids.  Contact a health care  provider if you notice signs of aspiration. This information is not intended to replace advice given to you by your health care provider. Make sure you discuss any questions you have with your health care provider. Document Released: 02/04/2010 Document Revised: 09/30/2015 Document Reviewed: 09/30/2015 Elsevier Interactive Patient Education  [employer].

## 2017-01-13 NOTE — Progress Notes (Signed)
Instructions regarding care of peg tube, feedings, etc. Given to wife.  Patient transferred to his motorized wheelchair and all belongings including computer for communication and stand transported and loaded into family [facility] to the care of his wife.  Hospice to meet them at home.

## 2017-02-16 DEATH — deceased

## 2019-08-30 IMAGING — CR DG CHEST 1V PORT
1 series · 1 of 1 positions shown · non-contrast
Comparison: 01/08/2017

CLINICAL DATA: Aspiration pneumonia.

EXAM:
PORTABLE CHEST 1 VIEW

[portable]
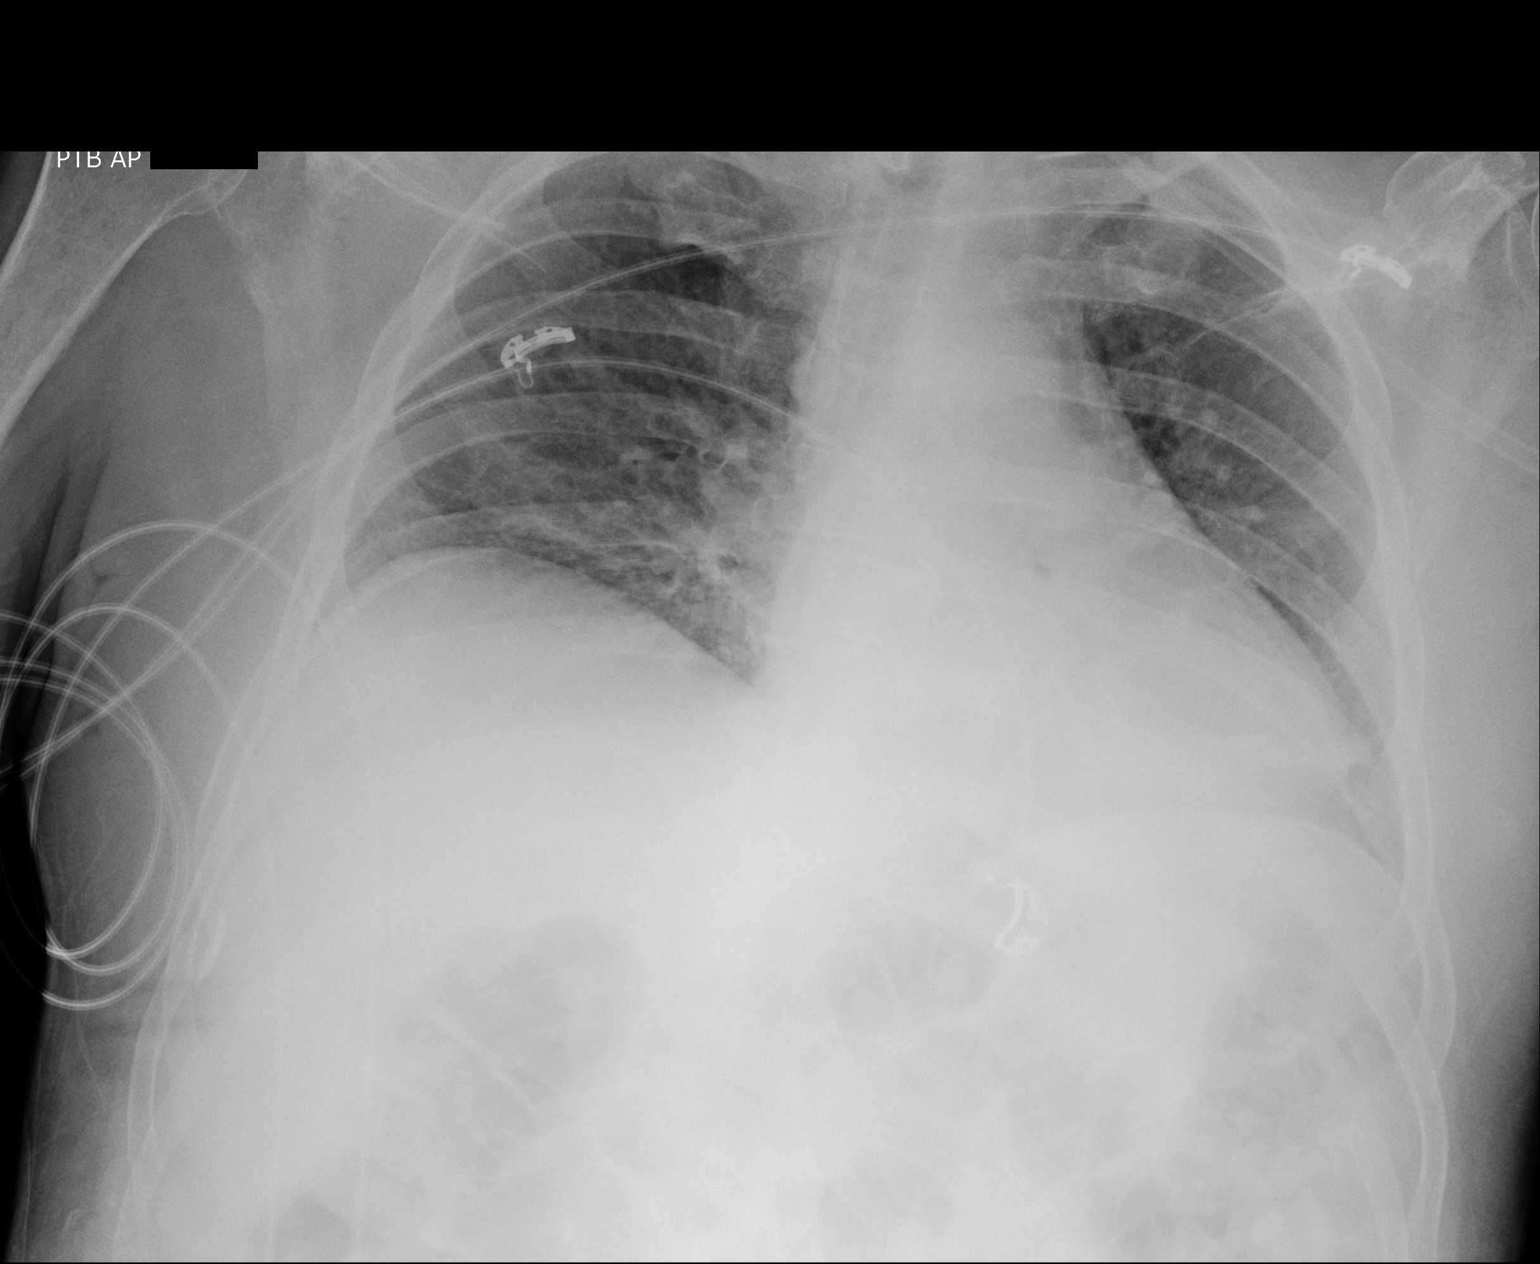

[1 of 1 positions shown; findings below may reference images not displayed]

FINDINGS: There is persistent bibasilar atelectasis at least in part due to a
shallow inspiration. No significant change.

Heart size and vascularity are normal. No significant bone
abnormality.
IMPRESSION: No change in the appearance of the chest. Bibasilar atelectasis, at
least in part due to a shallow inspiration. No lung consolidation.
